# Patient Record
Sex: Male | Born: 1955 | Race: White | Hispanic: No | Marital: Married | Smoking: Former smoker
Health system: Southern US, Community
[De-identification: ages and names within clinical notes are randomized; demographics above are authoritative.]

## PROBLEM LIST (undated history)

## (undated) DIAGNOSIS — C801 Malignant (primary) neoplasm, unspecified: Secondary | ICD-10-CM

## (undated) DIAGNOSIS — I10 Essential (primary) hypertension: Secondary | ICD-10-CM

## (undated) DIAGNOSIS — E785 Hyperlipidemia, unspecified: Secondary | ICD-10-CM

## (undated) DIAGNOSIS — I2510 Atherosclerotic heart disease of native coronary artery without angina pectoris: Secondary | ICD-10-CM

## (undated) DIAGNOSIS — I4891 Unspecified atrial fibrillation: Secondary | ICD-10-CM

## (undated) HISTORY — PX: VASECTOMY: SHX75

## (undated) HISTORY — PX: APPENDECTOMY: SHX54

## (undated) HISTORY — DX: Hyperlipidemia, unspecified: E78.5

## (undated) HISTORY — DX: Essential (primary) hypertension: I10

## (undated) HISTORY — PX: LUMBAR DISC SURGERY: SHX700

## (undated) HISTORY — DX: Atherosclerotic heart disease of native coronary artery without angina pectoris: I25.10

## (undated) HISTORY — PX: WISDOM TOOTH EXTRACTION: SHX21

## (undated) HISTORY — PX: HERNIA REPAIR: SHX51

---

## 1998-07-17 ENCOUNTER — Ambulatory Visit (HOSPITAL_COMMUNITY): Admission: RE | Admit: 1998-07-17 | Discharge: 1998-07-17 | Payer: Self-pay | Admitting: *Deleted

## 1999-11-15 ENCOUNTER — Encounter: Payer: Self-pay | Admitting: Neurosurgery

## 1999-11-15 ENCOUNTER — Encounter: Admission: RE | Admit: 1999-11-15 | Discharge: 1999-11-15 | Payer: Self-pay | Admitting: Neurosurgery

## 2007-02-09 ENCOUNTER — Emergency Department (HOSPITAL_COMMUNITY): Admission: EM | Admit: 2007-02-09 | Discharge: 2007-02-09 | Payer: Self-pay | Admitting: Emergency Medicine

## 2008-05-05 ENCOUNTER — Encounter: Payer: Self-pay | Admitting: Cardiovascular Disease

## 2008-08-03 ENCOUNTER — Encounter: Payer: Self-pay | Admitting: Cardiovascular Disease

## 2008-08-08 ENCOUNTER — Encounter: Payer: Self-pay | Admitting: Cardiovascular Disease

## 2008-08-16 ENCOUNTER — Encounter: Payer: Self-pay | Admitting: Cardiovascular Disease

## 2008-08-19 ENCOUNTER — Ambulatory Visit: Payer: Self-pay | Admitting: Cardiology

## 2008-08-19 ENCOUNTER — Encounter: Payer: Self-pay | Admitting: Cardiovascular Disease

## 2008-09-12 DIAGNOSIS — M549 Dorsalgia, unspecified: Secondary | ICD-10-CM | POA: Insufficient documentation

## 2008-09-12 DIAGNOSIS — K469 Unspecified abdominal hernia without obstruction or gangrene: Secondary | ICD-10-CM | POA: Insufficient documentation

## 2008-09-15 ENCOUNTER — Ambulatory Visit: Payer: Self-pay | Admitting: Cardiovascular Disease

## 2008-09-15 DIAGNOSIS — R079 Chest pain, unspecified: Secondary | ICD-10-CM | POA: Insufficient documentation

## 2008-09-15 DIAGNOSIS — H8110 Benign paroxysmal vertigo, unspecified ear: Secondary | ICD-10-CM | POA: Insufficient documentation

## 2008-09-15 DIAGNOSIS — I10 Essential (primary) hypertension: Secondary | ICD-10-CM | POA: Insufficient documentation

## 2008-09-15 DIAGNOSIS — I491 Atrial premature depolarization: Secondary | ICD-10-CM | POA: Insufficient documentation

## 2008-09-30 ENCOUNTER — Telehealth (INDEPENDENT_AMBULATORY_CARE_PROVIDER_SITE_OTHER): Payer: Self-pay | Admitting: *Deleted

## 2008-10-14 ENCOUNTER — Encounter: Payer: Self-pay | Admitting: Cardiovascular Disease

## 2010-03-30 NOTE — Assessment & Plan Note (Signed)
Summary: NP3/ PALPS, HTN./ PT HAS [facility]/ GD   CC:  no compliants referral dr Dimas Aguas.  History of Present Illness: Ardith is seen today at the request of Dr. Dimas Aguas.  He was ill about a month ago.  He had an extensive workup.  Read through multiple records from [facility].  All started initially with what sounds like an episode of vertigo.  He had significant dizziness and presyncope.  He also had atypical chest pain.  Had a stress Myoview study read by Dr.Degent:   the study was done June 22 of 2010.  It was normal with a hypertensive response.  Since that time he has been placed on lisinopril HCTZ.  It appears that he had untreated high blood pressure for a while.  His blood pressure is much improved.  He had initial palpitations.  They're associated with his vertigo.  They're frequent skips but no prolonged rapid heart beats.  Are not associated with diaphoresis or chest pain.  He's hadan event  monitor on for 16 days.  Will have him take it off and process it.  He is having some PACs here in the office.  Early he feels 100% improved.  His vertigo is gone.  Further  neurological workup including CT scan MRI and carotid duplex scan were all negative.    Current Problems (verified): 1)  Hernia  (ICD-553.9) 2)  Back Pain, Chronic  (ICD-724.5)  Current Medications (verified): 1)  Flexeril 10 Mg Tabs (Cyclobenzaprine Hcl) .Marland Kitchen.. 1 Tab By Mouth Once Daily 2)  Lisinopril-Hydrochlorothiazide 10-12.5 Mg Tabs (Lisinopril-Hydrochlorothiazide) .Marland Kitchen.. 1 Tab By Mouth Once Daily 3)  Aspirin 81 Mg Tbec (Aspirin) .... Take One Tablet By Mouth Daily 4)  Multivitamins   Tabs (Multiple Vitamin) .Marland Kitchen.. 1 Tab By Mouth Once Daily 5)  Darvocet-N 100 100-650 Mg Tabs (Propoxyphene N-Apap) .... As Needed 6)  Vytorin 10-20 Mg Tabs (Ezetimibe-Simvastatin) .... Take One Tablet By Mouth Daily At Bedtime  Allergies (verified): No Known Drug Allergies  Past History:  Past Medical History: Last updated: 09/12/2008 HERNIA  (ICD-553.9) BACK PAIN, CHRONIC (ICD-724.5)    Past Surgical History: Last updated: 09/12/2008 back surgery hernia  Family History: non-contributory  Social History: Tobacco Use - No.  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  Review of Systems       Denies fever, malais, weight loss, blurry vision, decreased visual acuity, cough, sputum, SOB, hemoptysis, pleuritic pain, , heartburn, abdominal pain, melena, lower extremity edema, claudication, or rash. All other systems reviewed and negative except as indicated in HPI   Vital Signs:  Patient profile:   55 year old male Height:      73 inches Weight:      180 pounds BMI:     23.83 Pulse rate:   90 / minute Resp:     12 per minute BP sitting:   139 / 83  (left arm)  Vitals Entered By: Kem Parkinson (September 15, 2008 8:17 AM)  Physical Exam  General:  Affect appropriate Healthy:  appears stated age HEENT: normal Neck supple with no adenopathy JVP normal no bruits no thyromegaly Lungs clear with no wheezing and good diaphragmatic motion Heart:  S1/S2 no murmur,rub, gallop or click PMI normal Abdomen: benighn, BS positve, no tenderness, no AAA no bruit.  No HSM or HJR Distal pulses intact with no bruits No  edema Neuro non-focal Skin warm and dry    Impression & Recommendations:  Problem # 1:  CHEST PAIN UNSPECIFIED (ICD-786.50) Normal myovue atypical continue to observe His updated medication list for this problem includes:    Lisinopril-hydrochlorothiazide 10-12.5 Mg Tabs (Lisinopril-hydrochlorothiazide) .Marland Kitchen... 1 tab by mouth once daily    Aspirin 81 Mg Tbec (Aspirin) .Marland Kitchen... Take one tablet by mouth daily  Problem # 2:  BENIGN POSITIONAL VERTIGO (ICD-386.11) Resolved with normal head CT, MRI and carotid duplex all reviewed today  Problem # 3:  PAC (ICD-427.61) R/O atrial  arrythmias.  Will review event monitor when turned in and processed His updated medication list for this problem includes:    Lisinopril-hydrochlorothiazide 10-12.5 Mg Tabs (Lisinopril-hydrochlorothiazide) .Marland Kitchen... 1 tab by mouth once daily    Aspirin 81 Mg Tbec (Aspirin) .Marland Kitchen... Take one tablet by mouth daily  Problem # 4:  ESSENTIAL HYPERTENSION, BENIGN (ICD-401.1) Well controlled on ACE and diuretic His updated medication list for this problem includes:    Lisinopril-hydrochlorothiazide 10-12.5 Mg Tabs (Lisinopril-hydrochlorothiazide) .Marland Kitchen... 1 tab by mouth once daily    Aspirin 81 Mg Tbec (Aspirin) .Marland Kitchen... Take one tablet by mouth daily   EKG Report  Procedure date:  09/15/2008  Findings:      NSR 92 ICRBBB Borderline ECG

## 2010-03-30 NOTE — Progress Notes (Signed)
  Phone Note Outgoing Call   Call placed by: Deliah Goody, RN,  September 30, 2008 11:54 AM Summary of Call: pt aware of final moniter results, per dr Eden Emms sinus rhythm with occ PAC/PVC, no sig arrythmia Deliah Goody, RN  September 30, 2008 11:57 AM

## 2010-03-30 NOTE — Letter (Signed)
Summary: [facility]  [facility]   Imported By: Roderic Ovens 10/14/2008 13:17:33  _____________________________________________________________________  External Attachment:    Type:   Image     Comment:   External Document

## 2014-12-18 ENCOUNTER — Encounter (INDEPENDENT_AMBULATORY_CARE_PROVIDER_SITE_OTHER): Payer: Self-pay | Admitting: Ophthalmology

## 2014-12-18 ENCOUNTER — Encounter (INDEPENDENT_AMBULATORY_CARE_PROVIDER_SITE_OTHER): Payer: 59 | Admitting: Ophthalmology

## 2014-12-18 DIAGNOSIS — H43813 Vitreous degeneration, bilateral: Secondary | ICD-10-CM

## 2014-12-18 DIAGNOSIS — H2513 Age-related nuclear cataract, bilateral: Secondary | ICD-10-CM | POA: Diagnosis not present

## 2014-12-18 DIAGNOSIS — H35033 Hypertensive retinopathy, bilateral: Secondary | ICD-10-CM | POA: Diagnosis not present

## 2014-12-18 DIAGNOSIS — I10 Essential (primary) hypertension: Secondary | ICD-10-CM | POA: Diagnosis not present

## 2016-04-11 DIAGNOSIS — R300 Dysuria: Secondary | ICD-10-CM | POA: Diagnosis not present

## 2016-04-11 DIAGNOSIS — N342 Other urethritis: Secondary | ICD-10-CM | POA: Diagnosis not present

## 2016-04-27 DIAGNOSIS — I10 Essential (primary) hypertension: Secondary | ICD-10-CM | POA: Diagnosis not present

## 2016-04-27 DIAGNOSIS — M549 Dorsalgia, unspecified: Secondary | ICD-10-CM | POA: Diagnosis not present

## 2016-04-27 DIAGNOSIS — Z0000 Encounter for general adult medical examination without abnormal findings: Secondary | ICD-10-CM | POA: Diagnosis not present

## 2016-04-27 DIAGNOSIS — E785 Hyperlipidemia, unspecified: Secondary | ICD-10-CM | POA: Diagnosis not present

## 2016-05-23 DIAGNOSIS — L821 Other seborrheic keratosis: Secondary | ICD-10-CM | POA: Diagnosis not present

## 2016-05-23 DIAGNOSIS — L814 Other melanin hyperpigmentation: Secondary | ICD-10-CM | POA: Diagnosis not present

## 2016-05-23 DIAGNOSIS — D1801 Hemangioma of skin and subcutaneous tissue: Secondary | ICD-10-CM | POA: Diagnosis not present

## 2016-06-15 DIAGNOSIS — E785 Hyperlipidemia, unspecified: Secondary | ICD-10-CM | POA: Diagnosis not present

## 2016-06-15 DIAGNOSIS — G4700 Insomnia, unspecified: Secondary | ICD-10-CM | POA: Diagnosis not present

## 2016-08-17 DIAGNOSIS — M5137 Other intervertebral disc degeneration, lumbosacral region: Secondary | ICD-10-CM | POA: Diagnosis not present

## 2016-08-17 DIAGNOSIS — E785 Hyperlipidemia, unspecified: Secondary | ICD-10-CM | POA: Diagnosis not present

## 2016-08-17 DIAGNOSIS — M549 Dorsalgia, unspecified: Secondary | ICD-10-CM | POA: Diagnosis not present

## 2016-10-13 DIAGNOSIS — H43393 Other vitreous opacities, bilateral: Secondary | ICD-10-CM | POA: Diagnosis not present

## 2016-10-21 DIAGNOSIS — I10 Essential (primary) hypertension: Secondary | ICD-10-CM | POA: Diagnosis not present

## 2016-10-21 DIAGNOSIS — B356 Tinea cruris: Secondary | ICD-10-CM | POA: Diagnosis not present

## 2016-10-21 DIAGNOSIS — E785 Hyperlipidemia, unspecified: Secondary | ICD-10-CM | POA: Diagnosis not present

## 2016-10-24 DIAGNOSIS — I10 Essential (primary) hypertension: Secondary | ICD-10-CM | POA: Diagnosis not present

## 2016-10-24 DIAGNOSIS — E784 Other hyperlipidemia: Secondary | ICD-10-CM | POA: Diagnosis not present

## 2017-01-02 DIAGNOSIS — H00012 Hordeolum externum right lower eyelid: Secondary | ICD-10-CM | POA: Diagnosis not present

## 2017-01-25 DIAGNOSIS — M5137 Other intervertebral disc degeneration, lumbosacral region: Secondary | ICD-10-CM | POA: Diagnosis not present

## 2017-01-25 DIAGNOSIS — M549 Dorsalgia, unspecified: Secondary | ICD-10-CM | POA: Diagnosis not present

## 2017-01-25 DIAGNOSIS — Z23 Encounter for immunization: Secondary | ICD-10-CM | POA: Diagnosis not present

## 2017-01-25 DIAGNOSIS — G894 Chronic pain syndrome: Secondary | ICD-10-CM | POA: Diagnosis not present

## 2017-01-25 DIAGNOSIS — E785 Hyperlipidemia, unspecified: Secondary | ICD-10-CM | POA: Diagnosis not present

## 2017-04-27 DIAGNOSIS — H43813 Vitreous degeneration, bilateral: Secondary | ICD-10-CM | POA: Diagnosis not present

## 2017-04-27 DIAGNOSIS — H33311 Horseshoe tear of retina without detachment, right eye: Secondary | ICD-10-CM | POA: Diagnosis not present

## 2017-04-28 DIAGNOSIS — Z125 Encounter for screening for malignant neoplasm of prostate: Secondary | ICD-10-CM | POA: Diagnosis not present

## 2017-04-28 DIAGNOSIS — E785 Hyperlipidemia, unspecified: Secondary | ICD-10-CM | POA: Diagnosis not present

## 2017-04-28 DIAGNOSIS — I10 Essential (primary) hypertension: Secondary | ICD-10-CM | POA: Diagnosis not present

## 2017-04-28 DIAGNOSIS — Z0000 Encounter for general adult medical examination without abnormal findings: Secondary | ICD-10-CM | POA: Diagnosis not present

## 2017-04-28 DIAGNOSIS — H33311 Horseshoe tear of retina without detachment, right eye: Secondary | ICD-10-CM | POA: Diagnosis not present

## 2017-04-28 DIAGNOSIS — M549 Dorsalgia, unspecified: Secondary | ICD-10-CM | POA: Diagnosis not present

## 2017-05-12 DIAGNOSIS — H33311 Horseshoe tear of retina without detachment, right eye: Secondary | ICD-10-CM | POA: Diagnosis not present

## 2017-05-23 DIAGNOSIS — L820 Inflamed seborrheic keratosis: Secondary | ICD-10-CM | POA: Diagnosis not present

## 2017-05-23 DIAGNOSIS — D2261 Melanocytic nevi of right upper limb, including shoulder: Secondary | ICD-10-CM | POA: Diagnosis not present

## 2017-05-23 DIAGNOSIS — D1801 Hemangioma of skin and subcutaneous tissue: Secondary | ICD-10-CM | POA: Diagnosis not present

## 2017-05-23 DIAGNOSIS — L821 Other seborrheic keratosis: Secondary | ICD-10-CM | POA: Diagnosis not present

## 2017-05-23 DIAGNOSIS — D485 Neoplasm of uncertain behavior of skin: Secondary | ICD-10-CM | POA: Diagnosis not present

## 2017-05-23 DIAGNOSIS — L814 Other melanin hyperpigmentation: Secondary | ICD-10-CM | POA: Diagnosis not present

## 2017-05-30 DIAGNOSIS — H33311 Horseshoe tear of retina without detachment, right eye: Secondary | ICD-10-CM | POA: Diagnosis not present

## 2017-06-07 DIAGNOSIS — Z1211 Encounter for screening for malignant neoplasm of colon: Secondary | ICD-10-CM | POA: Diagnosis not present

## 2017-06-27 DIAGNOSIS — H33311 Horseshoe tear of retina without detachment, right eye: Secondary | ICD-10-CM | POA: Diagnosis not present

## 2017-07-28 DIAGNOSIS — K635 Polyp of colon: Secondary | ICD-10-CM | POA: Diagnosis not present

## 2017-07-28 DIAGNOSIS — D126 Benign neoplasm of colon, unspecified: Secondary | ICD-10-CM | POA: Diagnosis not present

## 2017-07-28 DIAGNOSIS — Z1211 Encounter for screening for malignant neoplasm of colon: Secondary | ICD-10-CM | POA: Diagnosis not present

## 2017-09-26 DIAGNOSIS — H33311 Horseshoe tear of retina without detachment, right eye: Secondary | ICD-10-CM | POA: Diagnosis not present

## 2017-09-26 DIAGNOSIS — H43813 Vitreous degeneration, bilateral: Secondary | ICD-10-CM | POA: Diagnosis not present

## 2017-10-31 DIAGNOSIS — G894 Chronic pain syndrome: Secondary | ICD-10-CM | POA: Diagnosis not present

## 2017-10-31 DIAGNOSIS — I10 Essential (primary) hypertension: Secondary | ICD-10-CM | POA: Diagnosis not present

## 2017-10-31 DIAGNOSIS — E785 Hyperlipidemia, unspecified: Secondary | ICD-10-CM | POA: Diagnosis not present

## 2018-01-05 DIAGNOSIS — L03114 Cellulitis of left upper limb: Secondary | ICD-10-CM | POA: Diagnosis not present

## 2018-01-05 DIAGNOSIS — Z23 Encounter for immunization: Secondary | ICD-10-CM | POA: Diagnosis not present

## 2018-02-05 DIAGNOSIS — H43393 Other vitreous opacities, bilateral: Secondary | ICD-10-CM | POA: Diagnosis not present

## 2018-04-23 DIAGNOSIS — M545 Low back pain: Secondary | ICD-10-CM | POA: Diagnosis not present

## 2018-04-23 DIAGNOSIS — S39012A Strain of muscle, fascia and tendon of lower back, initial encounter: Secondary | ICD-10-CM | POA: Diagnosis not present

## 2018-05-25 DIAGNOSIS — I10 Essential (primary) hypertension: Secondary | ICD-10-CM | POA: Diagnosis not present

## 2018-05-25 DIAGNOSIS — E785 Hyperlipidemia, unspecified: Secondary | ICD-10-CM | POA: Diagnosis not present

## 2018-05-25 DIAGNOSIS — Z125 Encounter for screening for malignant neoplasm of prostate: Secondary | ICD-10-CM | POA: Diagnosis not present

## 2018-05-29 DIAGNOSIS — E785 Hyperlipidemia, unspecified: Secondary | ICD-10-CM | POA: Diagnosis not present

## 2018-05-29 DIAGNOSIS — I10 Essential (primary) hypertension: Secondary | ICD-10-CM | POA: Diagnosis not present

## 2018-05-29 DIAGNOSIS — Z0000 Encounter for general adult medical examination without abnormal findings: Secondary | ICD-10-CM | POA: Diagnosis not present

## 2018-05-29 DIAGNOSIS — M549 Dorsalgia, unspecified: Secondary | ICD-10-CM | POA: Diagnosis not present

## 2018-07-20 DIAGNOSIS — Z20828 Contact with and (suspected) exposure to other viral communicable diseases: Secondary | ICD-10-CM | POA: Diagnosis not present

## 2019-08-25 NOTE — Progress Notes (Signed)
Cardiology Office Note:   Date:  08/27/2019  NAME:  Lonnie Palmer    MRN: [id number] DOB:  1955-06-27   PCP:  No primary care provider on file.  Cardiologist:  No primary care provider on file.   Referring MD: Donald Prose, MD   Chief Complaint  Patient presents with  . Palpitations   History of Present Illness:   Lonnie Palmer is a 64 y.o. male with a hx of hyperlipidemia, hypertension who is being seen today for the evaluation of tachycardia/palpitations at the request of Donald Prose, MD.  He reports he has had episodes of what he describes as skipped heartbeats for years.  He reports 10 to 15 years of these.  Apparently he can feel his pulse at times and notices that his pulse is irregular.  He was recently evaluated his primary care physician after a stressful event at work.  Apparently he had high blood pressure and significant stress at work.  He noticed about 36 hours of blood pressure spikes as well as elevated heart rates.  He reports things have calm down has had no further recurrence of persistently elevated heart rates.  His EKG today demonstrates normal sinus rhythm with a heart rate 86 and an occasional PAC.  He reports that his extra heartbeats do not bother him.  He can play golf and walk as far as he likes without any limitations such as chest pain or shortness of breath.  His most recent thyroid studies below are normal.  He has no lower extremity edema or any symptoms suggestive of congestive heart failure.  He reports that he is here to make sure things okay.  He does have high cholesterol but is well controlled on medication.  His blood pressure today is 118/80.  He has never had a heart attack or stroke.  He seems to be without symptoms.  Main issue that I can tell are PACs versus PVCs lead quantify these.  He is a former smoker but quit a number of years ago.  He smoked for about 7 years.  He does not consume alcohol in excess.  No reported.  He does work as an Civil Service fast streamer for [employer].  Problem List 1. HTN 2. HLD -Total cholesterol 126, HDL 51, LDL 55, triglycerides 115, hemoglobin 17.2, TSH 0.72  Past Medical History: Past Medical History:  Diagnosis Date  . Hyperlipidemia   . Hypertension     Past Surgical History: Past Surgical History:  Procedure Laterality Date  . LUMBAR DISC SURGERY      Current Medications: Current Meds  Medication Sig  . aspirin EC 81 MG tablet Take 81 mg by mouth daily. Swallow whole.  . cyclobenzaprine (FLEXERIL) 10 MG tablet Take 10 mg by mouth at bedtime as needed.  . ezetimibe (ZETIA) 10 MG tablet Take 10 mg by mouth daily.  Marland Kitchen HYDROcodone-acetaminophen (NORCO/VICODIN) 5-325 MG tablet Take 1 tablet by mouth every 6 (six) hours as needed for moderate pain.  Marland Kitchen lisinopril-hydrochlorothiazide (ZESTORETIC) 10-12.5 MG tablet Take 1 tablet by mouth daily.  . Multiple Vitamins-Minerals (VITRUM 50+ SENIOR MULTI PO) Take by mouth.  . simvastatin (ZOCOR) 20 MG tablet Take 20 mg by mouth daily.     Allergies:    Patient has no allergy information on record.   Social History: Social History   Socioeconomic History  . Marital status: Married    Spouse name: Not on file  . Number of children: 1  . Years of education:  Not on file  . Highest education level: Not on file  Occupational History  . Occupation: Contractor  Tobacco Use  . Smoking status: Former Smoker    Packs/day: 0.50    Years: 6.00    Pack years: 3.00  . Smokeless tobacco: Never Used  Substance and Sexual Activity  . Alcohol use: Never  . Drug use: Never  . Sexual activity: Not on file  Other Topics Concern  . Not on file  Social History Narrative  . Not on file   Social Determinants of Health   Financial Resource Strain:   . Difficulty of Paying Living Expenses:   Food Insecurity:   . Worried About Charity fundraiser in the Last Year:   . Arboriculturist in the Last Year:   Transportation Needs:   . Lexicographer (Medical):   Marland Kitchen Lack of Transportation (Non-Medical):   Physical Activity:   . Days of Exercise per Week:   . Minutes of Exercise per Session:   Stress:   . Feeling of Stress :   Social Connections:   . Frequency of Communication with Friends and Family:   . Frequency of Social Gatherings with Friends and Family:   . Attends Religious Services:   . Active Member of Clubs or Organizations:   . Attends Archivist Meetings:   Marland Kitchen Marital Status:      Family History: The patient's family history includes Heart attack in his maternal grandfather.  ROS:   All other ROS reviewed and negative. Pertinent positives noted in the HPI.     EKGs/Labs/Other Studies Reviewed:   The following studies were personally reviewed by me today:  EKG:  EKG is  ordered today.  The ekg ordered today demonstrates normal sinus rhythm, heart rate 86, PACs noted, and was personally reviewed by me.   Recent Labs: No results found for requested labs within last 8760 hours.   Recent Lipid Panel No results found for: CHOL, TRIG, HDL, CHOLHDL, VLDL, LDLCALC, LDLDIRECT  Physical Exam:   VS:  BP 118/80   Pulse 88   Ht 6\' 1"  (1.854 m)   Wt 182 lb 12.8 oz (82.9 kg)   SpO2 99%   BMI 24.12 kg/m    Wt Readings from Last 3 Encounters:  08/27/19 182 lb 12.8 oz (82.9 kg)    General: Well nourished, well developed, in no acute distress Heart: Atraumatic, normal size  Eyes: PEERLA, EOMI  Neck: Supple, no JVD Endocrine: No thryomegaly Cardiac: Normal S1, S2; RRR; no murmurs, rubs, or gallops Lungs: Clear to auscultation bilaterally, no wheezing, rhonchi or rales  Abd: Soft, nontender, no hepatomegaly  Ext: No edema, pulses 2+ Musculoskeletal: No deformities, BUE and BLE strength normal and equal Skin: Warm and dry, no rashes   Neuro: Alert and oriented to person, place, time, and situation, CNII-XII grossly intact, no focal deficits  Psych: Normal mood and affect   ASSESSMENT:     Lonnie Palmer is a 64 y.o. male who presents for the following: 1. PAC (premature atrial contraction)   2. Palpitations   3. Tachycardia   4. Essential hypertension   5. Mixed hyperlipidemia     PLAN:   1. PAC (premature atrial contraction) 2. Palpitations 3. Tachycardia -He has had symptoms of skipped heartbeats for years.  No monitor in our system.  Thyroid studies were normal recently.  This did coincide with stress at work.  This also coincided with elevated  blood pressure.  This could have been the trigger for his PACs.  He possibly could be having PVCs.  We will proceed with an echocardiogram to make sure his heart structurally normal.  We will also proceed with a 3-day Zio patch to quantify his PAC or PVC burden.  His EKG today demonstrates normal sinus rhythm with left axis deviation and a single PAC.  This is not need to be treated unless he is having a tremendous amount of them.  This would also need to be treated he has detriment to his heart function.  He describes no symptoms that are bothersome at this time.  We will hold on any treatment for now.  I will plan to see him back in 3 months after he completes a monitor and echocardiogram.  4. Essential hypertension -Continue current blood pressure medications.  BP 118/80 today.  5. Mixed hyperlipidemia -Most recent LDL cholesterol well below 70.  Continue statin.  Disposition: Return in about 3 months (around 11/27/2019).  Medication Adjustments/Labs and Tests Ordered: Current medicines are reviewed at length with the patient today.  Concerns regarding medicines are outlined above.  Orders Placed This Encounter  Procedures  . LONG TERM MONITOR (3-14 DAYS)  . EKG 12-Lead  . ECHOCARDIOGRAM COMPLETE   No orders of the defined types were placed in this encounter.   Patient Instructions  Medication Instructions:  The current medical regimen is effective;  continue present plan and medications.  *If you need a refill  on your cardiac medications before your next appointment, please call your pharmacy*   Testing/Procedures: Echocardiogram - Your physician has requested that you have an echocardiogram. Echocardiography is a painless test that uses sound waves to create images of your heart. It provides your doctor with information about the size and shape of your heart and how well your heart's chambers and valves are working. This procedure takes approximately one hour. There are no restrictions for this procedure. This will be performed at our [facility] location - [address].  Your physician has recommended that you wear a 3 DAY ZIO-PATCH monitor. The Zio patch cardiac monitor continuously records heart rhythm data for up to 14 days, this is for patients being evaluated for multiple types heart rhythms. For the first 24 hours post application, please avoid getting the Zio monitor wet in the shower or by excessive sweating during exercise. After that, feel free to carry on with regular activities. Keep soaps and lotions away from the ZIO XT Patch.  This will be mailed to you, please expect 7-10 days to receive.         Follow-Up: At [facility], you and your health needs are our priority.  As part of our continuing mission to provide you with exceptional heart care, we have created designated Provider Care Teams.  These Care Teams include your primary Cardiologist (physician) and Advanced Practice Providers (APPs -  Physician Assistants and Nurse Practitioners) who all work together to provide you with the care you need, when you need it.  We recommend signing up for the patient portal called "MyChart".  Sign up information is provided on this After Visit Summary.  MyChart is used to connect with patients for Virtual Visits (Telemedicine).  Patients are able to view lab/test results, encounter notes, upcoming appointments, etc.  Non-urgent messages can be sent to your provider as well.   To  learn more about what you can do with MyChart, go to [email or web address].  Your next appointment:   3 month(s)  The format for your next appointment:   In Person  Provider:   Eleonore Chiquito, MD        Signed, Addison Naegeli. Audie Box, [place]  [address], [place], [place] [address] [phone]  08/27/2019 10:56 AM

## 2019-08-27 ENCOUNTER — Encounter: Payer: Self-pay | Admitting: Cardiovascular Disease

## 2019-08-27 ENCOUNTER — Other Ambulatory Visit: Payer: Self-pay

## 2019-08-27 ENCOUNTER — Ambulatory Visit: Payer: 59 | Admitting: Cardiovascular Disease

## 2019-08-27 VITALS — BP 118/80 | HR 88 | Ht 73.0 in | Wt 182.8 lb

## 2019-08-27 DIAGNOSIS — I491 Atrial premature depolarization: Secondary | ICD-10-CM | POA: Diagnosis not present

## 2019-08-27 DIAGNOSIS — R000 Tachycardia, unspecified: Secondary | ICD-10-CM | POA: Diagnosis not present

## 2019-08-27 DIAGNOSIS — I10 Essential (primary) hypertension: Secondary | ICD-10-CM | POA: Diagnosis not present

## 2019-08-27 DIAGNOSIS — E782 Mixed hyperlipidemia: Secondary | ICD-10-CM

## 2019-08-27 DIAGNOSIS — R002 Palpitations: Secondary | ICD-10-CM | POA: Diagnosis not present

## 2019-08-27 NOTE — Patient Instructions (Signed)
Medication Instructions:  The current medical regimen is effective;  continue present plan and medications.  *If you need a refill on your cardiac medications before your next appointment, please call your pharmacy*   Testing/Procedures: Echocardiogram - Your physician has requested that you have an echocardiogram. Echocardiography is a painless test that uses sound waves to create images of your heart. It provides your doctor with information about the size and shape of your heart and how well your heart's chambers and valves are working. This procedure takes approximately one hour. There are no restrictions for this procedure. This will be performed at our [facility] location - [address], [address].  Your physician has recommended that you wear a 3 DAY ZIO-PATCH monitor. The Zio patch cardiac monitor continuously records heart rhythm data for up to 14 days, this is for patients being evaluated for multiple types heart rhythms. For the first 24 hours post application, please avoid getting the Zio monitor wet in the shower or by excessive sweating during exercise. After that, feel free to carry on with regular activities. Keep soaps and lotions away from the ZIO XT Patch.  This will be mailed to you, please expect 7-10 days to receive.         Follow-Up: At [facility], you and your health needs are our priority.  As part of our continuing mission to provide you with exceptional heart care, we have created designated Provider Care Teams.  These Care Teams include your primary Cardiologist (physician) and Advanced Practice Providers (APPs -  Physician Assistants and Nurse Practitioners) who all work together to provide you with the care you need, when you need it.  We recommend signing up for the patient portal called "MyChart".  Sign up information is provided on this After Visit Summary.  MyChart is used to connect with patients for Virtual Visits (Telemedicine).  Patients are able to  view lab/test results, encounter notes, upcoming appointments, etc.  Non-urgent messages can be sent to your provider as well.   To learn more about what you can do with MyChart, go to [email or web address].    Your next appointment:   3 month(s)  The format for your next appointment:   In Person  Provider:   Eleonore Chiquito, MD

## 2019-08-28 ENCOUNTER — Encounter: Payer: Self-pay | Admitting: *Deleted

## 2019-08-28 NOTE — Progress Notes (Signed)
Patient ID: Lonnie Palmer, male   DOB: 02-Nov-1955, 64 y.o.   MRN: [id number] Patient enrolled for [employer] to ship a 3 day ZIO XT long term holter monitor to his home.

## 2019-08-31 ENCOUNTER — Ambulatory Visit (INDEPENDENT_AMBULATORY_CARE_PROVIDER_SITE_OTHER): Payer: 59

## 2019-08-31 DIAGNOSIS — R002 Palpitations: Secondary | ICD-10-CM | POA: Diagnosis not present

## 2019-09-11 ENCOUNTER — Telehealth: Payer: Self-pay | Admitting: *Deleted

## 2019-09-11 NOTE — Telephone Encounter (Signed)
Returned call to patient. He was concerned because he received a letter from his insurance stating his echo had been approved but not his monitor. I explained the monitor does not require any pre authorization.

## 2019-09-11 NOTE — Telephone Encounter (Signed)
[employer] is calling requesting to speak with Lonnie Palmer in regards to prior authorization for his monitor. Please advise.

## 2019-09-12 ENCOUNTER — Ambulatory Visit (HOSPITAL_COMMUNITY): Payer: 59 | Attending: Internal Medicine

## 2019-09-12 ENCOUNTER — Other Ambulatory Visit: Payer: Self-pay

## 2019-09-12 DIAGNOSIS — R002 Palpitations: Secondary | ICD-10-CM | POA: Diagnosis not present

## 2019-09-23 ENCOUNTER — Telehealth: Payer: Self-pay

## 2019-09-23 NOTE — Telephone Encounter (Signed)
Got it!     Thanks =).

## 2019-09-23 NOTE — Telephone Encounter (Signed)
Received a call from [place] with [employer] calling to report patient's monitor has been posted.Message sent to monitor tech to up load report to Dr.O'Neal. 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

## 2019-09-27 ENCOUNTER — Other Ambulatory Visit: Payer: Self-pay

## 2019-09-27 MED ORDER — METOPROLOL SUCCINATE ER 25 MG PO TB24
25.0000 mg | ORAL_TABLET | Freq: Every day | ORAL | 1 refills | Status: DC
Start: 2019-09-27 — End: 2019-11-15

## 2019-11-14 NOTE — Progress Notes (Signed)
Cardiology Office Note:   Date:  11/15/2019  NAME:  Lonnie Palmer    MRN: [id number] DOB:  1955-12-30   PCP:  Donald Prose, MD  Cardiologist:  Evalina Field, MD   Referring MD: No ref. provider found   Chief Complaint  Patient presents with  . Follow-up   History of Present Illness:   Lonnie Palmer is a 64 y.o. male with a hx of HTN, HLD, PACs who presents for follow-up. Was seen for PACs. Found to have high PAC burden and normal echo. Started on beta blocker. He reports has been doing well.  EKG confirms he is in atrial flutter.  He reports he has noticed some intermittent palpitations but no major symptoms.  His initial symptoms were some dizziness and he was found to have frequent PACs.  He is clearly in atrial flutter today.  I did discuss with Dr. Lars Mage about consideration for a flutter ablation.  He will see him today as an urgent appointment. Blood pressures well controlled today 132/72.  He remains on metoprolol tartrate 25 mg daily.  Overall appears to be doing well.  He denies any chest pain, chest pain or shortness of breath in office today.  Problem List 1. HTN 2. HLD -Total cholesterol 126, HDL 51, LDL 55, triglycerides 115, hemoglobin 17.2, TSH 0.72 3. PACs -23.3% PAC burden 4.  Atrial flutter -11/15/2019. -CHADSVASC=1  Past Medical History: Past Medical History:  Diagnosis Date  . Hyperlipidemia   . Hypertension     Past Surgical History: Past Surgical History:  Procedure Laterality Date  . LUMBAR DISC SURGERY      Current Medications: Current Meds  Medication Sig  . aspirin EC 81 MG tablet Take 81 mg by mouth daily. Swallow whole.  . cyclobenzaprine (FLEXERIL) 10 MG tablet Take 10 mg by mouth at bedtime as needed.  . ezetimibe (ZETIA) 10 MG tablet Take 10 mg by mouth daily.  Marland Kitchen HYDROcodone-acetaminophen (NORCO/VICODIN) 5-325 MG tablet Take 1 tablet by mouth every 6 (six) hours as needed for moderate pain.  Marland Kitchen  lisinopril-hydrochlorothiazide (ZESTORETIC) 10-12.5 MG tablet Take 1 tablet by mouth daily.  . metoprolol succinate (TOPROL XL) 50 MG 24 hr tablet Take 1 tablet (50 mg total) by mouth daily.  . Multiple Vitamins-Minerals (VITRUM 50+ SENIOR MULTI PO) Take by mouth.  . simvastatin (ZOCOR) 20 MG tablet Take 20 mg by mouth daily.  . [DISCONTINUED] metoprolol succinate (TOPROL XL) 25 MG 24 hr tablet Take 1 tablet (25 mg total) by mouth daily.     Allergies:    Patient has no known allergies.   Social History: Social History   Socioeconomic History  . Marital status: Married    Spouse name: Not on file  . Number of children: 1  . Years of education: Not on file  . Highest education level: Not on file  Occupational History  . Occupation: Contractor  Tobacco Use  . Smoking status: Former Smoker    Packs/day: 0.50    Years: 6.00    Pack years: 3.00  . Smokeless tobacco: Never Used  Substance and Sexual Activity  . Alcohol use: Never  . Drug use: Never  . Sexual activity: Not on file  Other Topics Concern  . Not on file  Social History Narrative  . Not on file   Social Determinants of Health   Financial Resource Strain:   . Difficulty of Paying Living Expenses: Not on file  Food Insecurity:   .  Worried About Charity fundraiser in the Last Year: Not on file  . Ran Out of Food in the Last Year: Not on file  Transportation Needs:   . Lack of Transportation (Medical): Not on file  . Lack of Transportation (Non-Medical): Not on file  Physical Activity:   . Days of Exercise per Week: Not on file  . Minutes of Exercise per Session: Not on file  Stress:   . Feeling of Stress : Not on file  Social Connections:   . Frequency of Communication with Friends and Family: Not on file  . Frequency of Social Gatherings with Friends and Family: Not on file  . Attends Religious Services: Not on file  . Active Member of Clubs or Organizations: Not on file  . Attends Theatre manager Meetings: Not on file  . Marital Status: Not on file     Family History: The patient's family history includes Heart attack in his maternal grandfather.  ROS:   All other ROS reviewed and negative. Pertinent positives noted in the HPI.     EKGs/Labs/Other Studies Reviewed:   The following studies were personally reviewed by me today:  [place] 08/31/2019 Impression: 1. Brief ectopic atrial tachycardia episodes (longest 7 beats; 2.8 seconds). 2. Very frequent PACs (PAC burden 23.3%).  3. 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 No concerning AV block was present.  4. Frequent non-conducted PACs.   TTE 09/12/2019  1. Left ventricular ejection fraction, by estimation, is 60 to 65%. The  left ventricle has normal function. The left ventricle has no regional  wall motion abnormalities. There is mild left ventricular hypertrophy.  Left ventricular diastolic parameters  are consistent with Grade I diastolic dysfunction (impaired relaxation).  2. Right ventricular systolic function is normal. The right ventricular  size is normal.  3. The mitral valve is grossly normal. Trivial mitral valve  regurgitation.  4. The aortic valve is tricuspid. Aortic valve regurgitation is not  visualized.  5. The inferior vena cava is normal in size with greater than 50%  respiratory variability, suggesting right atrial pressure of 3 mmHg.   Recent Labs: No results found for requested labs within last 8760 hours.   Recent Lipid Panel No results found for: CHOL, TRIG, HDL, CHOLHDL, VLDL, LDLCALC, LDLDIRECT  Physical Exam:   VS:  BP 132/72   Pulse 94   Ht 6\' 1"  (1.854 m)   Wt 181 lb 6.4 oz (82.3 kg)   SpO2 98%   BMI 23.93 kg/m    Wt Readings from Last 3 Encounters:  11/15/19 181 lb 6.4 oz (82.3 kg)  08/27/19 182 lb 12.8 oz (82.9 kg)    General: Well nourished, well developed, in no acute distress Heart: Atraumatic, normal size  Eyes: PEERLA, EOMI  Neck: Supple, no  JVD Endocrine: No thryomegaly Cardiac: Normal S1, S2; irregular rhythm, no murmurs rubs or gallops Lungs: Clear to auscultation bilaterally, no wheezing, rhonchi or rales  Abd: Soft, nontender, no hepatomegaly  Ext: No edema, pulses 2+ Musculoskeletal: No deformities, BUE and BLE strength normal and equal Skin: Warm and dry, no rashes   Neuro: Alert and oriented to person, place, time, and situation, CNII-XII grossly intact, no focal deficits  Psych: Normal mood and affect   ASSESSMENT:   Lonnie Palmer is a 64 y.o. male who presents for the following: 1. Typical atrial flutter (HCC)   2. Palpitations   3. Essential hypertension   4. Mixed hyperlipidemia  PLAN:   1. Typical atrial flutter (HCC) 2. Palpitations -EKG shows typical atrial flutter. CHADSVASC=1.  He was given samples of Eliquis.  I did discuss his case with Dr. Lars Mage in electrophysiology.  I think he would be a good candidate for an ablation.  He will work him in for an urgent appointment today.  I did increase his metoprolol to 50 mg daily.  We did give him samples of Eliquis and start a prescription of 5 mg twice daily.  This may not be long-term for him. -We will see him back in 3 months.  3. Essential hypertension -Well-controlled today.  4. Mixed hyperlipidemia -Well-controlled on Lipitor.  Disposition: Return in about 3 months (around 02/14/2020).  Medication Adjustments/Labs and Tests Ordered: Current medicines are reviewed at length with the patient today.  Concerns regarding medicines are outlined above.  Orders Placed This Encounter  Procedures  . EKG 12-Lead   Meds ordered this encounter  Medications  . apixaban (ELIQUIS) 5 MG TABS tablet    Sig: Take 1 tablet (5 mg total) by mouth 2 (two) times daily.    Dispense:  60 tablet    Refill:  3  . metoprolol succinate (TOPROL XL) 50 MG 24 hr tablet    Sig: Take 1 tablet (50 mg total) by mouth daily.    Dispense:  90 tablet    Refill:   1    Patient Instructions  Medication Instructions:  Increase Metoprolol to 50 mg daily  Start Eliquis 5 mg twice daily   *If you need a refill on your cardiac medications before your next appointment, please call your pharmacy*   Follow-Up: At [facility], you and your health needs are our priority.  As part of our continuing mission to provide you with exceptional heart care, we have created designated Provider Care Teams.  These Care Teams include your primary Cardiologist (physician) and Advanced Practice Providers (APPs -  Physician Assistants and Nurse Practitioners) who all work together to provide you with the care you need, when you need it.  We recommend signing up for the patient portal called "MyChart".  Sign up information is provided on this After Visit Summary.  MyChart is used to connect with patients for Virtual Visits (Telemedicine).  Patients are able to view lab/test results, encounter notes, upcoming appointments, etc.  Non-urgent messages can be sent to your provider as well.   To learn more about what you can do with MyChart, go to [email or web address].    Your next appointment:   3 month(s)  The format for your next appointment:   In Person  Provider:   Eleonore Chiquito, MD        Time Spent with Patient: I have spent a total of 25 minutes with patient reviewing hospital notes, telemetry, EKGs, labs and examining the patient as well as establishing an assessment and plan that was discussed with the patient.  > 50% of time was spent in direct patient care.  Signed, Addison Naegeli. Audie Box, [place]  [address], [place], [place] [address] [phone]  11/15/2019 10:16 AM

## 2019-11-15 ENCOUNTER — Encounter: Payer: Self-pay | Admitting: Cardiovascular Disease

## 2019-11-15 ENCOUNTER — Ambulatory Visit: Payer: 59 | Admitting: Cardiovascular Disease

## 2019-11-15 ENCOUNTER — Ambulatory Visit: Payer: 59 | Admitting: Cardiology

## 2019-11-15 ENCOUNTER — Other Ambulatory Visit: Payer: Self-pay

## 2019-11-15 ENCOUNTER — Encounter: Payer: Self-pay | Admitting: Cardiology

## 2019-11-15 VITALS — BP 128/80 | HR 88 | Ht 73.0 in | Wt 181.4 lb

## 2019-11-15 VITALS — BP 132/72 | HR 94 | Ht 73.0 in | Wt 181.4 lb

## 2019-11-15 DIAGNOSIS — E782 Mixed hyperlipidemia: Secondary | ICD-10-CM

## 2019-11-15 DIAGNOSIS — I483 Typical atrial flutter: Secondary | ICD-10-CM

## 2019-11-15 DIAGNOSIS — I10 Essential (primary) hypertension: Secondary | ICD-10-CM

## 2019-11-15 DIAGNOSIS — R002 Palpitations: Secondary | ICD-10-CM | POA: Diagnosis not present

## 2019-11-15 LAB — CBC WITH DIFFERENTIAL/PLATELET
Basophils Absolute: 0 10*3/uL (ref 0.0–0.2)
Basos: 0 %
EOS (ABSOLUTE): 0 10*3/uL (ref 0.0–0.4)
Eos: 0 %
Hematocrit: 50.2 % (ref 37.5–51.0)
Hemoglobin: 17.3 g/dL (ref 13.0–17.7)
Immature Grans (Abs): 0 10*3/uL (ref 0.0–0.1)
Immature Granulocytes: 0 %
Lymphocytes Absolute: 1.5 10*3/uL (ref 0.7–3.1)
Lymphs: 20 %
MCH: 31.2 pg (ref 26.6–33.0)
MCHC: 34.5 g/dL (ref 31.5–35.7)
MCV: 91 fL (ref 79–97)
Monocytes Absolute: 0.8 10*3/uL (ref 0.1–0.9)
Monocytes: 11 %
Neutrophils Absolute: 4.9 10*3/uL (ref 1.4–7.0)
Neutrophils: 69 %
Platelets: 219 10*3/uL (ref 150–450)
RBC: 5.54 x10E6/uL (ref 4.14–5.80)
RDW: 12.6 % (ref 11.6–15.4)
WBC: 7.3 10*3/uL (ref 3.4–10.8)

## 2019-11-15 LAB — BASIC METABOLIC PANEL
BUN/Creatinine Ratio: 15 (ref 10–24)
BUN: 17 mg/dL (ref 8–27)
CO2: 28 mmol/L (ref 20–29)
Calcium: 9.4 mg/dL (ref 8.6–10.2)
Chloride: 99 mmol/L (ref 96–106)
Creatinine, Ser: 1.14 mg/dL (ref 0.76–1.27)
GFR calc Af Amer: 79 mL/min/{1.73_m2} (ref >59)
GFR calc non Af Amer: 68 mL/min/{1.73_m2} (ref >59)
Glucose: 92 mg/dL (ref 65–99)
Potassium: 4.2 mmol/L (ref 3.5–5.2)
Sodium: 141 mmol/L (ref 134–144)

## 2019-11-15 MED ORDER — APIXABAN 5 MG PO TABS: 5.0000 mg | ORAL_TABLET | Freq: Two times a day (BID) | ORAL | 3 refills | Status: DC

## 2019-11-15 MED ORDER — METOPROLOL SUCCINATE ER 50 MG PO TB24: 50.0000 mg | ORAL_TABLET | Freq: Every day | ORAL | 1 refills | Status: DC

## 2019-11-15 NOTE — H&P (View-Only) (Signed)
Electrophysiology Office Note:    Date:  11/15/2019   ID:  Lonnie Palmer, DOB Mar 09, 1955, MRN [id number]  PCP:  Donald Prose, MD  [facility] Cardiologist:  Evalina Field, MD  [facility] Electrophysiologist:  None   Referring MD: Donald Prose, MD   Chief Complaint: palpitations, PACs, typical atrial flutter  History of Present Illness:    Lonnie Palmer is a 64 y.o. male who presents for an evaluation of typical atrial flutter at the request of Dr Audie Box. Their medical history includes HTN, HLD and PACs. He has felt extra heart beats for many years and brief runs of palpitations (15 years). He has sought care but has never been seen or had an ECG performed during an episode. No presyncope/syncope. He has taken his pulse and knows that it has been irregular in the past. He is active and able to exert himself without limitations.   Past Medical History:  Diagnosis Date  . Hyperlipidemia   . Hypertension     Past Surgical History:  Procedure Laterality Date  . LUMBAR DISC SURGERY      Current Medications: Current Meds  Medication Sig  . apixaban (ELIQUIS) 5 MG TABS tablet Take 1 tablet (5 mg total) by mouth 2 (two) times daily.  Marland Kitchen aspirin EC 81 MG tablet Take 81 mg by mouth daily. Swallow whole.  . cyclobenzaprine (FLEXERIL) 10 MG tablet Take 10 mg by mouth at bedtime as needed.  . ezetimibe (ZETIA) 10 MG tablet Take 10 mg by mouth daily.  Marland Kitchen HYDROcodone-acetaminophen (NORCO/VICODIN) 5-325 MG tablet Take 1 tablet by mouth every 6 (six) hours as needed for moderate pain.  Marland Kitchen lisinopril-hydrochlorothiazide (ZESTORETIC) 10-12.5 MG tablet Take 1 tablet by mouth daily.  . metoprolol succinate (TOPROL XL) 50 MG 24 hr tablet Take 1 tablet (50 mg total) by mouth daily.  . Multiple Vitamins-Minerals (VITRUM 50+ SENIOR MULTI PO) Take by mouth.  . simvastatin (ZOCOR) 20 MG tablet Take 20 mg by mouth daily.     Allergies:   Patient has no known allergies.   Social History    Socioeconomic History  . Marital status: Married    Spouse name: Not on file  . Number of children: 1  . Years of education: Not on file  . Highest education level: Not on file  Occupational History  . Occupation: Contractor  Tobacco Use  . Smoking status: Former Smoker    Packs/day: 0.50    Years: 6.00    Pack years: 3.00  . Smokeless tobacco: Never Used  Substance and Sexual Activity  . Alcohol use: Never  . Drug use: Never  . Sexual activity: Not on file  Other Topics Concern  . Not on file  Social History Narrative  . Not on file   Social Determinants of Health   Financial Resource Strain:   . Difficulty of Paying Living Expenses: Not on file  Food Insecurity:   . Worried About Charity fundraiser in the Last Year: Not on file  . Ran Out of Food in the Last Year: Not on file  Transportation Needs:   . Lack of Transportation (Medical): Not on file  . Lack of Transportation (Non-Medical): Not on file  Physical Activity:   . Days of Exercise per Week: Not on file  . Minutes of Exercise per Session: Not on file  Stress:   . Feeling of Stress : Not on file  Social Connections:   . Frequency of Communication  with Friends and Family: Not on file  . Frequency of Social Gatherings with Friends and Family: Not on file  . Attends Religious Services: Not on file  . Active Member of Clubs or Organizations: Not on file  . Attends Archivist Meetings: Not on file  . Marital Status: Not on file     Family History: The patient's family history includes Heart attack in his maternal grandfather.  ROS:   Please see the history of present illness.    All other systems reviewed and are negative.  EKGs/Labs/Other Studies Reviewed:    The following studies were reviewed today: ECG  EKG:  The ekg ordered today demonstrates typical atrial flutter.  Recent Labs: No results found for requested labs within last 8760 hours.  Recent Lipid Panel No  results found for: CHOL, TRIG, HDL, CHOLHDL, VLDL, LDLCALC, LDLDIRECT  Physical Exam:    VS:  BP 128/80   Pulse 88   Ht 6\' 1"  (1.854 m)   Wt 181 lb 6.4 oz (82.3 kg)   SpO2 98%   BMI 23.93 kg/m     Wt Readings from Last 3 Encounters:  11/15/19 181 lb 6.4 oz (82.3 kg)  11/15/19 181 lb 6.4 oz (82.3 kg)  08/27/19 182 lb 12.8 oz (82.9 kg)     GEN:  Well nourished, well developed in no acute distress HEENT: Normal NECK: No JVD; No carotid bruits LYMPHATICS: No lymphadenopathy CARDIAC: irregularly irregular, tachycardic, no murmurs, rubs, gallops RESPIRATORY:  Clear to auscultation without rales, wheezing or rhonchi  ABDOMEN: Soft, non-tender, non-distended MUSCULOSKELETAL:  No edema; No deformity  SKIN: Warm and dry NEUROLOGIC:  Alert and oriented x 3 PSYCHIATRIC:  Normal affect   ASSESSMENT:    1. Typical atrial flutter (HCC)   2. Palpitations   3. Essential hypertension    PLAN:    In order of problems listed above:  1. AFL His history is consistent with PACs over the past decade now able to trigger episodes of atrial flutter. I do not have any evidence of prior AF. I discussed management options for atrial arrhythmias including antiarrhythmics and ablation including the risks/benefits of each. He is interested in pursuing ablation. The ablation strategy will be dependent on whether there is concomitant atrial fibrillation. To accurately assess this, and in anticipation of the need for long term monitoring for AF/AFL, will implant a loop recorder after cardioverting out of his current arrhythmia. He will need a TEE prior to the DCCV given he is starting his anticoagulant today. Will go ahead and schedule an ablation for November. If between the loop implant and November ablation date there is atrial fibrillation detected, will plan for a PVI + CTI ablation. If there is no atrial fibrillation detected in that time window, will only perform CTI ablation and continue monitoring for  AF. Both ablation procedures were discussed in detail with the patient and he is interested in proceeding.  Risk, benefits, and alternatives to EP study and radiofrequency ablation for atrial flutter and fibrillation were also discussed in detail today. These risks include but are not limited to stroke, bleeding, vascular damage, tamponade, perforation, damage to the esophagus, lungs, and other structures, pulmonary vein stenosis, worsening renal function, and death. The patient understands these risk and wishes to proceed.  We will therefore proceed with catheter ablation at the next available time.  Carto, ICE, anesthesia are requested for the procedure.  The patient will have a TEE as part of the cardioversion so will defer  CT scan prior to the ablation procedure.   Medication Adjustments/Labs and Tests Ordered: Current medicines are reviewed at length with the patient today.  Concerns regarding medicines are outlined above.  Orders Placed This Encounter  Procedures  . Basic Metabolic Panel (BMET)  . CBC w/Diff   No orders of the defined types were placed in this encounter.    Signed, Lars Mage, MD, [facility]  11/15/2019 12:55 PM    Electrophysiology [place]

## 2019-11-15 NOTE — Patient Instructions (Signed)
Medication Instructions:  Increase Metoprolol to 50 mg daily  Start Eliquis 5 mg twice daily   *If you need a refill on your cardiac medications before your next appointment, please call your pharmacy*   Follow-Up: At [facility], you and your health needs are our priority.  As part of our continuing mission to provide you with exceptional heart care, we have created designated Provider Care Teams.  These Care Teams include your primary Cardiologist (physician) and Advanced Practice Providers (APPs -  Physician Assistants and Nurse Practitioners) who all work together to provide you with the care you need, when you need it.  We recommend signing up for the patient portal called "MyChart".  Sign up information is provided on this After Visit Summary.  MyChart is used to connect with patients for Virtual Visits (Telemedicine).  Patients are able to view lab/test results, encounter notes, upcoming appointments, etc.  Non-urgent messages can be sent to your provider as well.   To learn more about what you can do with MyChart, go to [email or web address].    Your next appointment:   3 month(s)  The format for your next appointment:   In Person  Provider:   Eleonore Chiquito, MD

## 2019-11-15 NOTE — Patient Instructions (Addendum)
Medication Instructions:  Your physician recommends that you continue on your current medications as directed. Please refer to the Current Medication list given to you today.  *If you need a refill on your cardiac medications before your next appointment, please call your pharmacy*  Lab Work: You will get lab work today:  BMP and CBC  If you have labs (blood work) drawn today and your tests are completely normal, you will receive your results only by: Marland Kitchen MyChart Message (if you have MyChart) OR . A paper copy in the mail If you have any lab test that is abnormal or we need to change your treatment, we will call you to review the results.  Testing/Procedures: Your physician has requested that you have a TEE. During a TEE, sound waves are used to create images of your heart. It provides your doctor with information about the size and shape of your heart and how well your heart's chambers and valves are working. In this test, a transducer is attached to the end of a flexible tube that's guided down your throat and into your esophagus (the tube leading from you mouth to your stomach) to get a more detailed image of your heart. You are not awake for the procedure. Please see the instruction sheet given to you today. For further information please visit [email or web address].  Your physician has recommended that you have a Cardioversion (DCCV). Electrical Cardioversion uses a jolt of electricity to your heart either through paddles or wired patches attached to your chest. This is a controlled, usually prescheduled, procedure. Defibrillation is done under light anesthesia in the hospital, and you usually go home the day of the procedure. This is done to get your heart back into a normal rhythm. You are not awake for the procedure. Please see the instruction sheet given to you today.  Follow-Up:  Cardioversion instructions:  Covid test:  September 21 at 9:00 am -- This is a Drive Up Visit at [address] [place], [place] [address] Someone will direct you to the appropriate testing line. Stay in your car and someone will be with you shortly.  After your covid test go home and quarantine until the day of your procedure.    You are scheduled for a TEE Cardioversion on November 21, 2019 with Dr. Loletha Grayer.   Please arrive at the [facility] (Main Entrance A) at [facility]: [place], [place] [address] at 10:00 am.   DIET: Nothing to eat or drink after midnight except a sip of water with medications (see medication instructions below)  Medication Instructions: TAKE all your morning medications with a sip of water  TAKE your ELIQUIS  You will need to continue your anticoagulant after your procedure until you  are told by your  Provider that it is safe to stop  You must have a responsible person to drive you home and stay in the waiting area during your procedure. Failure to do so could result in cancellation.  Bring your insurance cards.  *Special Note: Every effort is made to have your procedure done on time. Occasionally there are emergencies that occur at the hospital that may cause delays. Please be patient if a delay does occur.    Atrial Flutter ablation January 03, 2020  Cardiac Ablation Cardiac ablation is a procedure to disable (ablate) a small amount of heart tissue in very specific places. The heart has many electrical connections. Sometimes these connections are abnormal and can cause the heart to  beat very fast or irregularly. Ablating some of the problem areas can improve the heart rhythm or return it to normal. Ablation may be done for people who:  Have Wolff-Parkinson-White syndrome.  Have fast heart rhythms (tachycardia).  Have taken medicines for an abnormal heart rhythm (arrhythmia) that were not effective or caused side effects.  Have a high-risk heartbeat that may be life-threatening. During the procedure, a small incision is made in the neck or  the groin, and a long, thin, flexible tube (catheter) is inserted into the incision and moved to the heart. Small devices (electrodes) on the tip of the catheter will send out electrical currents. A type of X-ray (fluoroscopy) will be used to help guide the catheter and to provide images of the heart. Tell a health care provider about:  Any allergies you have.  All medicines you are taking, including vitamins, herbs, eye drops, creams, and over-the-counter medicines.  Any problems you or family members have had with anesthetic medicines.  Any blood disorders you have.  Any surgeries you have had.  Any medical conditions you have, such as kidney failure.  Whether you are pregnant or may be pregnant. What are the risks? Generally, this is a safe procedure. However, problems may occur, including:  Infection.  Bruising and bleeding at the catheter insertion site.  Bleeding into the chest, especially into the sac that surrounds the heart. This is a serious complication.  Stroke or blood clots.  Damage to other structures or organs.  Allergic reaction to medicines or dyes.  Need for a permanent pacemaker if the normal electrical system is damaged. A pacemaker is a small computer that sends electrical signals to the heart and helps your heart beat normally.  The procedure not being fully effective. This may not be recognized until months later. Repeat ablation procedures are sometimes required. What happens before the procedure?  Follow instructions from your health care provider about eating or drinking restrictions.  Ask your health care provider about: ? Changing or stopping your regular medicines. This is especially important if you are taking diabetes medicines or blood thinners. ? Taking medicines such as aspirin and ibuprofen. These medicines can thin your blood. Do not take these medicines before your procedure if your health care provider instructs you not to.  Plan to  have someone take you home from the hospital or clinic.  If you will be going home right after the procedure, plan to have someone with you for 24 hours. What happens during the procedure?  To lower your risk of infection: ? Your health care team will wash or sanitize their hands. ? Your skin will be washed with soap. ? Hair may be removed from the incision area.  An IV tube will be inserted into one of your veins.  You will be given a medicine to help you relax (sedative).  The skin on your neck or groin will be numbed.  An incision will be made in your neck or your groin.  A needle will be inserted through the incision and into a large vein in your neck or groin.  A catheter will be inserted into the needle and moved to your heart.  Dye may be injected through the catheter to help your surgeon see the area of the heart that needs treatment.  Electrical currents will be sent from the catheter to ablate heart tissue in desired areas. There are three types of energy that may be used to ablate heart tissue: ? Heat (radiofrequency  energy). ? Laser energy. ? Extreme cold (cryoablation).  When the necessary tissue has been ablated, the catheter will be removed.  Pressure will be held on the catheter insertion area to prevent excessive bleeding.  A bandage (dressing) will be placed over the catheter insertion area. The procedure may vary among health care providers and hospitals. What happens after the procedure?  Your blood pressure, heart rate, breathing rate, and blood oxygen level will be monitored until the medicines you were given have worn off.  Your catheter insertion area will be monitored for bleeding. You will need to lie still for a few hours to ensure that you do not bleed from the catheter insertion area.  Do not drive for 24 hours or as long as directed by your health care provider. Summary  Cardiac ablation is a procedure to disable (ablate) a small amount of  heart tissue in very specific places. Ablating some of the problem areas can improve the heart rhythm or return it to normal.  During the procedure, electrical currents will be sent from the catheter to ablate heart tissue in desired areas. This information is not intended to replace advice given to you by your health care provider. Make sure you discuss any questions you have with your health care provider. Document Revised: 08/07/2017 Document Reviewed: 01/04/2016 Elsevier Patient Education  [place].

## 2019-11-15 NOTE — Progress Notes (Signed)
Electrophysiology Office Note:    Date:  11/15/2019   ID:  Lonnie Palmer, DOB [birth year], MRN [id number]  PCP:  Donald Prose, MD  [facility] Cardiologist:  Evalina Field, MD  [facility] Electrophysiologist:  None   Referring MD: Donald Prose, MD   Chief Complaint: palpitations, PACs, typical atrial flutter  History of Present Illness:    Lonnie Palmer is a 64 y.o. male who presents for an evaluation of typical atrial flutter at the request of Dr Audie Box. Their medical history includes HTN, HLD and PACs. He has felt extra heart beats for many years and brief runs of palpitations (15 years). He has sought care but has never been seen or had an ECG performed during an episode. No presyncope/syncope. He has taken his pulse and knows that it has been irregular in the past. He is active and able to exert himself without limitations.   Past Medical History:  Diagnosis Date  . Hyperlipidemia   . Hypertension     Past Surgical History:  Procedure Laterality Date  . LUMBAR DISC SURGERY      Current Medications: Current Meds  Medication Sig  . apixaban (ELIQUIS) 5 MG TABS tablet Take 1 tablet (5 mg total) by mouth 2 (two) times daily.  Marland Kitchen aspirin EC 81 MG tablet Take 81 mg by mouth daily. Swallow whole.  . cyclobenzaprine (FLEXERIL) 10 MG tablet Take 10 mg by mouth at bedtime as needed.  . ezetimibe (ZETIA) 10 MG tablet Take 10 mg by mouth daily.  Marland Kitchen HYDROcodone-acetaminophen (NORCO/VICODIN) 5-325 MG tablet Take 1 tablet by mouth every 6 (six) hours as needed for moderate pain.  Marland Kitchen lisinopril-hydrochlorothiazide (ZESTORETIC) 10-12.5 MG tablet Take 1 tablet by mouth daily.  . metoprolol succinate (TOPROL XL) 50 MG 24 hr tablet Take 1 tablet (50 mg total) by mouth daily.  . Multiple Vitamins-Minerals (VITRUM 50+ SENIOR MULTI PO) Take by mouth.  . simvastatin (ZOCOR) 20 MG tablet Take 20 mg by mouth daily.     Allergies:   Patient has no known allergies.   Social History     Socioeconomic History  . Marital status: Married    Spouse name: Not on file  . Number of children: 1  . Years of education: Not on file  . Highest education level: Not on file  Occupational History  . Occupation: Contractor  Tobacco Use  . Smoking status: Former Smoker    Packs/day: 0.50    Years: 6.00    Pack years: 3.00  . Smokeless tobacco: Never Used  Substance and Sexual Activity  . Alcohol use: Never  . Drug use: Never  . Sexual activity: Not on file  Other Topics Concern  . Not on file  Social History Narrative  . Not on file   Social Determinants of Health   Financial Resource Strain:   . Difficulty of Paying Living Expenses: Not on file  Food Insecurity:   . Worried About Charity fundraiser in the Last Year: Not on file  . Ran Out of Food in the Last Year: Not on file  Transportation Needs:   . Lack of Transportation (Medical): Not on file  . Lack of Transportation (Non-Medical): Not on file  Physical Activity:   . Days of Exercise per Week: Not on file  . Minutes of Exercise per Session: Not on file  Stress:   . Feeling of Stress : Not on file  Social Connections:   . Frequency of  Communication with Friends and Family: Not on file  . Frequency of Social Gatherings with Friends and Family: Not on file  . Attends Religious Services: Not on file  . Active Member of Clubs or Organizations: Not on file  . Attends Archivist Meetings: Not on file  . Marital Status: Not on file     Family History: The patient's family history includes Heart attack in his maternal grandfather.  ROS:   Please see the history of present illness.    All other systems reviewed and are negative.  EKGs/Labs/Other Studies Reviewed:    The following studies were reviewed today: ECG  EKG:  The ekg ordered today demonstrates typical atrial flutter.  Recent Labs: No results found for requested labs within last 8760 hours.  Recent Lipid Panel No  results found for: CHOL, TRIG, HDL, CHOLHDL, VLDL, LDLCALC, LDLDIRECT  Physical Exam:    VS:  BP 128/80   Pulse 88   Ht 6\' 1"  (1.854 m)   Wt 181 lb 6.4 oz (82.3 kg)   SpO2 98%   BMI 23.93 kg/m     Wt Readings from Last 3 Encounters:  11/15/19 181 lb 6.4 oz (82.3 kg)  11/15/19 181 lb 6.4 oz (82.3 kg)  08/27/19 182 lb 12.8 oz (82.9 kg)     GEN:  Well nourished, well developed in no acute distress HEENT: Normal NECK: No JVD; No carotid bruits LYMPHATICS: No lymphadenopathy CARDIAC: irregularly irregular, tachycardic, no murmurs, rubs, gallops RESPIRATORY:  Clear to auscultation without rales, wheezing or rhonchi  ABDOMEN: Soft, non-tender, non-distended MUSCULOSKELETAL:  No edema; No deformity  SKIN: Warm and dry NEUROLOGIC:  Alert and oriented x 3 PSYCHIATRIC:  Normal affect   ASSESSMENT:    1. Typical atrial flutter (HCC)   2. Palpitations   3. Essential hypertension    PLAN:    In order of problems listed above:  1. AFL His history is consistent with PACs over the past decade now able to trigger episodes of atrial flutter. I do not have any evidence of prior AF. I discussed management options for atrial arrhythmias including antiarrhythmics and ablation including the risks/benefits of each. He is interested in pursuing ablation. The ablation strategy will be dependent on whether there is concomitant atrial fibrillation. To accurately assess this, and in anticipation of the need for long term monitoring for AF/AFL, will implant a loop recorder after cardioverting out of his current arrhythmia. He will need a TEE prior to the DCCV given he is starting his anticoagulant today. Will go ahead and schedule an ablation for November. If between the loop implant and November ablation date there is atrial fibrillation detected, will plan for a PVI + CTI ablation. If there is no atrial fibrillation detected in that time window, will only perform CTI ablation and continue monitoring for  AF. Both ablation procedures were discussed in detail with the patient and he is interested in proceeding.  Risk, benefits, and alternatives to EP study and radiofrequency ablation for atrial flutter and fibrillation were also discussed in detail today. These risks include but are not limited to stroke, bleeding, vascular damage, tamponade, perforation, damage to the esophagus, lungs, and other structures, pulmonary vein stenosis, worsening renal function, and death. The patient understands these risk and wishes to proceed.  We will therefore proceed with catheter ablation at the next available time.  Carto, ICE, anesthesia are requested for the procedure.  The patient will have a TEE as part of the cardioversion so will  defer CT scan prior to the ablation procedure.   Medication Adjustments/Labs and Tests Ordered: Current medicines are reviewed at length with the patient today.  Concerns regarding medicines are outlined above.  Orders Placed This Encounter  Procedures  . Basic Metabolic Panel (BMET)  . CBC w/Diff   No orders of the defined types were placed in this encounter.    Signed, Lars Mage, MD, [facility]  11/15/2019 12:55 PM    Electrophysiology [place]

## 2019-11-19 ENCOUNTER — Ambulatory Visit: Payer: Self-pay | Admitting: Cardiology

## 2019-11-19 ENCOUNTER — Other Ambulatory Visit (HOSPITAL_COMMUNITY)
Admission: RE | Admit: 2019-11-19 | Discharge: 2019-11-19 | Disposition: A | Discharge status: Home / Self Care | Payer: 59 | Source: Ambulatory Visit | Attending: Internal Medicine | Admitting: Internal Medicine

## 2019-11-19 ENCOUNTER — Telehealth: Payer: Self-pay | Admitting: Cardiovascular Disease

## 2019-11-19 DIAGNOSIS — Z20822 Contact with and (suspected) exposure to covid-19: Secondary | ICD-10-CM | POA: Insufficient documentation

## 2019-11-19 DIAGNOSIS — Z01812 Encounter for preprocedural laboratory examination: Secondary | ICD-10-CM | POA: Diagnosis present

## 2019-11-19 LAB — SARS CORONAVIRUS 2 (TAT 6-24 HRS): SARS Coronavirus 2: NEGATIVE

## 2019-11-19 NOTE — Telephone Encounter (Signed)
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 He states they are non symptomatic. He states his daughter in law told him to let the office know, so that while under anesthesia he will need special care and not to move his neck in certain positions.

## 2019-11-19 NOTE — Telephone Encounter (Signed)
Sent mychart message to Pt advising to let Short Stay nurse know on arrival about his neck issues.

## 2019-11-21 ENCOUNTER — Ambulatory Visit (HOSPITAL_COMMUNITY): Payer: 59 | Admitting: Anesthesiology

## 2019-11-21 ENCOUNTER — Encounter (HOSPITAL_COMMUNITY): Payer: Self-pay

## 2019-11-21 ENCOUNTER — Encounter (HOSPITAL_COMMUNITY)
Admission: RE | Disposition: A | Discharge status: Home / Self Care | Payer: Self-pay | Source: Home / Self Care | Attending: Internal Medicine

## 2019-11-21 ENCOUNTER — Encounter (HOSPITAL_COMMUNITY)
Admission: RE | Disposition: A | Discharge status: Home / Self Care | Payer: 59 | Source: Home / Self Care | Attending: Internal Medicine

## 2019-11-21 ENCOUNTER — Ambulatory Visit (HOSPITAL_COMMUNITY)
Admission: RE | Admit: 2019-11-21 | Discharge: 2019-11-21 | Disposition: A | Discharge status: Home / Self Care | Payer: 59 | Attending: Internal Medicine | Admitting: Internal Medicine

## 2019-11-21 ENCOUNTER — Ambulatory Visit (HOSPITAL_COMMUNITY): Payer: 59

## 2019-11-21 ENCOUNTER — Encounter (HOSPITAL_COMMUNITY): Payer: Self-pay | Admitting: Internal Medicine

## 2019-11-21 DIAGNOSIS — I10 Essential (primary) hypertension: Secondary | ICD-10-CM | POA: Insufficient documentation

## 2019-11-21 DIAGNOSIS — Z79899 Other long term (current) drug therapy: Secondary | ICD-10-CM | POA: Diagnosis not present

## 2019-11-21 DIAGNOSIS — E785 Hyperlipidemia, unspecified: Secondary | ICD-10-CM | POA: Insufficient documentation

## 2019-11-21 DIAGNOSIS — I483 Typical atrial flutter: Secondary | ICD-10-CM | POA: Diagnosis not present

## 2019-11-21 DIAGNOSIS — R002 Palpitations: Secondary | ICD-10-CM | POA: Insufficient documentation

## 2019-11-21 DIAGNOSIS — Z7901 Long term (current) use of anticoagulants: Secondary | ICD-10-CM | POA: Diagnosis not present

## 2019-11-21 DIAGNOSIS — Z87891 Personal history of nicotine dependence: Secondary | ICD-10-CM | POA: Insufficient documentation

## 2019-11-21 DIAGNOSIS — Z7982 Long term (current) use of aspirin: Secondary | ICD-10-CM | POA: Insufficient documentation

## 2019-11-21 DIAGNOSIS — I4892 Unspecified atrial flutter: Secondary | ICD-10-CM | POA: Diagnosis not present

## 2019-11-21 HISTORY — PX: LOOP RECORDER INSERTION: EP1214

## 2019-11-21 SURGERY — LOOP RECORDER INSERTION

## 2019-11-21 SURGERY — CANCELLED PROCEDURE

## 2019-11-21 MED ORDER — LIDOCAINE HCL (PF) 1 % IJ SOLN
INTRAMUSCULAR | Status: DC | PRN
  Administered 2019-11-21: 2 mL

## 2019-11-21 MED ORDER — LIDOCAINE-EPINEPHRINE 1 %-1:100000 IJ SOLN
INTRAMUSCULAR | Status: AC
  Filled 2019-11-21: qty 1

## 2019-11-21 SURGICAL SUPPLY — 2 items
MONITOR REVEAL LINQ II (Prosthesis & Implant Heart) ×1 IMPLANT
PACK LOOP INSERTION (CUSTOM PROCEDURE TRAY) ×2 IMPLANT

## 2019-11-21 NOTE — Progress Notes (Signed)
Pt transferred to cath lab for loop recorder placement.

## 2019-11-21 NOTE — Progress Notes (Signed)
Pt given discharge instructions and was able to verbalized them.  Clean dry dressing applied to site. Pt discharged per MD order.

## 2019-11-21 NOTE — Progress Notes (Signed)
Pt is in sinus rhythm prior to TEE/CV. Confirmed with EKG. Dr. Gasper Sells aware. TEE/CV cancelled per MD. Pt is to have loop recorder placement.

## 2019-11-21 NOTE — Progress Notes (Signed)
Pt received from Endo to cath lab holding for a Loop Recorder Implant by Dr Quentin Ore.

## 2019-11-26 ENCOUNTER — Telehealth: Payer: Self-pay | Admitting: Cardiology

## 2019-11-26 NOTE — Telephone Encounter (Signed)
Calling patient in regards to his question.   Patient has questions regarding wound dressings, what dressing to take off or leave on. Patient also expressed his concerns about confusion from what staff informed him at the hospital in contrast to his d/c paper work. Patient states the paper work is different than what he was told. Discuss follow-up with Dr. Quentin Ore in office on 12/19/19. Patient will send an email and I will assess wound to the best of my ability.

## 2019-11-26 NOTE — Telephone Encounter (Signed)
    Pt would like to speak with Dr. Mardene Speak nurse Sonia Baller. He said he have some questions.

## 2019-11-26 NOTE — Telephone Encounter (Signed)
Photos received in email. Patient gave consent to attach picture to chart. When patient removed dressing, ster-stipes came off as well. Patient reports wound is closed. Denies any bleeding or drainage to site. Advised patient if he notes any bleeding, swelling, drainage, fever or chills to call the device clinic. Verbalizes understanding.   Patient expresses he would like to reschedule his follow-up with Dr. Quentin Ore.Advsied him I would forward this the he scheduler and she will give him a call. Agrees to plan.

## 2019-12-02 ENCOUNTER — Telehealth: Payer: Self-pay

## 2019-12-02 NOTE — Telephone Encounter (Signed)
Pt sent following email:  Myrene Galas, MD 1 hour ago (10:24 AM)  MP Since I can only initiate messages to you Dr. Audie Box... had some weird symptoms Sunday morning... uneasy soreness/tightness in chest area.Marland KitchenMarland Kitchenbp was up and pulse rate up... both resolved after a couple hours...both Saturday and Sunday nights after going to bed felt like more harder beats but actual rate was good...9s and 70s. Can the data from loop recorder be analyzed for Saturday night, Sunday morning and Sunday night for rhythm abnormalities? I was told by the [employer] rep at the cath lab that I'd be able to make entries via the app for symptom marking... but that's not available to me for some reason. Im feeling totally normal today!! Thanks for hearing me out. You are the only one that My Chart lets me send messages to. Sorry to be a bother.   Transmissions received 12/01/19 at 12:38 am 12/01/19 at 12:05pm These brief episodes do not exactly explain ptsymptoms.  Forwarding to MD for review./

## 2019-12-05 NOTE — Interval H&P Note (Signed)
History and Physical Interval Note:  12/05/2019 8:51 PM  Lonnie Palmer  has presented today for surgery, with the diagnosis of a flutter.  The various methods of treatment have been discussed with the patient and family. After consideration of risks, benefits and other options for treatment, the patient has consented to  Procedure(s): LOOP RECORDER INSERTION (N/A) as a surgical intervention.  The patient's history has been reviewed, patient examined, no change in status, stable for surgery.  I have reviewed the patient's chart and labs.  Questions were answered to the patient's satisfaction.     Ramiya Delahunty T Danna Casella

## 2019-12-19 ENCOUNTER — Encounter: Payer: 59 | Admitting: Cardiology

## 2019-12-24 ENCOUNTER — Encounter: Payer: Self-pay | Admitting: Cardiology

## 2019-12-24 ENCOUNTER — Telehealth: Payer: Self-pay

## 2019-12-24 ENCOUNTER — Other Ambulatory Visit: Payer: Self-pay

## 2019-12-24 ENCOUNTER — Ambulatory Visit (INDEPENDENT_AMBULATORY_CARE_PROVIDER_SITE_OTHER): Payer: 59

## 2019-12-24 ENCOUNTER — Ambulatory Visit (INDEPENDENT_AMBULATORY_CARE_PROVIDER_SITE_OTHER): Payer: 59 | Admitting: Cardiology

## 2019-12-24 ENCOUNTER — Encounter: Payer: Self-pay | Admitting: *Deleted

## 2019-12-24 VITALS — BP 128/76 | HR 74 | Ht 73.0 in | Wt 182.4 lb

## 2019-12-24 DIAGNOSIS — R002 Palpitations: Secondary | ICD-10-CM | POA: Diagnosis not present

## 2019-12-24 DIAGNOSIS — I10 Essential (primary) hypertension: Secondary | ICD-10-CM | POA: Diagnosis not present

## 2019-12-24 DIAGNOSIS — E782 Mixed hyperlipidemia: Secondary | ICD-10-CM

## 2019-12-24 DIAGNOSIS — I4891 Unspecified atrial fibrillation: Secondary | ICD-10-CM

## 2019-12-24 DIAGNOSIS — I483 Typical atrial flutter: Secondary | ICD-10-CM

## 2019-12-24 DIAGNOSIS — I480 Paroxysmal atrial fibrillation: Secondary | ICD-10-CM

## 2019-12-24 MED ORDER — FLECAINIDE ACETATE 100 MG PO TABS: 100.0000 mg | ORAL_TABLET | Freq: Two times a day (BID) | ORAL | 3 refills | Status: DC

## 2019-12-24 NOTE — H&P (View-Only) (Signed)
Electrophysiology Office Follow up Visit Note:    Date:  12/24/2019   ID:  Lonnie Palmer, DOB 1955-05-13, MRN [id number]  PCP:  Donald Prose, MD  [facility] Cardiologist:  Evalina Field, MD  [place] Electrophysiologist:  Vickie Epley, MD    Interval History:    Lonnie Palmer is a 64 y.o. male who presents for a follow up visit. They were last seen in clinic November 15, 2019 for atrial flutter.  He was subsequently cardioverted and a loop recorder placed to help assess for atrial fibrillation prior to a planned ablation procedure November 5.  He has been doing well since his last appointment and tolerating his Eliquis and metoprolol.  Since their last appointment, he continues to experience symptomatic paroxysms of tachycardia.  Review of his loop interrogation today shows episodes of atrial fibrillation and atrial flutter.  He continues to be very active including playing golf but he says he feels winded at times playing golf.  He checks his pulse frequently and during these episodes of feeling winded, his heart rates are typically in the 100-130 range.     Past Medical History:  Diagnosis Date  . Hyperlipidemia   . Hypertension     Past Surgical History:  Procedure Laterality Date  . LOOP RECORDER INSERTION N/A 11/21/2019   Procedure: LOOP RECORDER INSERTION;  Surgeon: Vickie Epley, MD;  Location: [place] [facility];  Service: Cardiovascular;  Laterality: N/A;  . LUMBAR DISC SURGERY      Current Medications: Current Meds  Medication Sig  . apixaban (ELIQUIS) 5 MG TABS tablet Take 1 tablet (5 mg total) by mouth 2 (two) times daily.  Marland Kitchen aspirin EC 81 MG tablet Take 81 mg by mouth daily. Swallow whole.  . cyclobenzaprine (FLEXERIL) 10 MG tablet Take 10 mg by mouth at bedtime.   Marland Kitchen ezetimibe (ZETIA) 10 MG tablet Take 10 mg by mouth daily.  Marland Kitchen HYDROcodone-acetaminophen (NORCO/VICODIN) 5-325 MG tablet Take 1 tablet by mouth every 6 (six) hours as  needed for moderate pain.  Marland Kitchen ketoconazole (NIZORAL) 2 % cream Apply 1 application topically daily.  Marland Kitchen lisinopril-hydrochlorothiazide (ZESTORETIC) 10-12.5 MG tablet Take 1 tablet by mouth daily.  . metoprolol succinate (TOPROL XL) 50 MG 24 hr tablet Take 1 tablet (50 mg total) by mouth daily.  . Multiple Vitamins-Minerals (VITRUM 50+ SENIOR MULTI PO) Take by mouth.  . simvastatin (ZOCOR) 20 MG tablet Take 20 mg by mouth daily.     Allergies:   Patient has no known allergies.   Social History   Socioeconomic History  . Marital status: Married    Spouse name: Not on file  . Number of children: 1  . Years of education: Not on file  . Highest education level: Not on file  Occupational History  . Occupation: Contractor  Tobacco Use  . Smoking status: Former Smoker    Packs/day: 0.50    Years: 6.00    Pack years: 3.00  . Smokeless tobacco: Never Used  Substance and Sexual Activity  . Alcohol use: Never  . Drug use: Never  . Sexual activity: Not on file  Other Topics Concern  . Not on file  Social History Narrative  . Not on file   Social Determinants of Health   Financial Resource Strain:   . Difficulty of Paying Living Expenses: Not on file  Food Insecurity:   . Worried About Charity fundraiser in the Last Year: Not on file  .  Ran Out of Food in the Last Year: Not on file  Transportation Needs:   . Lack of Transportation (Medical): Not on file  . Lack of Transportation (Non-Medical): Not on file  Physical Activity:   . Days of Exercise per Week: Not on file  . Minutes of Exercise per Session: Not on file  Stress:   . Feeling of Stress : Not on file  Social Connections:   . Frequency of Communication with Friends and Family: Not on file  . Frequency of Social Gatherings with Friends and Family: Not on file  . Attends Religious Services: Not on file  . Active Member of Clubs or Organizations: Not on file  . Attends Archivist Meetings: Not on  file  . Marital Status: Not on file     Family History: The patient's family history includes Heart attack in his maternal grandfather.  ROS:   Please see the history of present illness.    All other systems reviewed and are negative.  EKGs/Labs/Other Studies Reviewed:    The following studies were reviewed today: In person device interrogation  December 24, 2019 loop interrogation personally reviewed Episodes of atrial fibrillation and atrial flutter.  2.5% burden.  EKG:  The ekg ordered today demonstrates sinus rhythm with PACs.  Recent Labs: 11/15/2019: BUN 17; Creatinine, Ser 1.14; Hemoglobin 17.3; Platelets 219; Potassium 4.2; Sodium 141  Recent Lipid Panel No results found for: CHOL, TRIG, HDL, CHOLHDL, VLDL, LDLCALC, LDLDIRECT  Physical Exam:    VS:  BP 128/76   Pulse 74   Ht 6\' 1"  (1.854 m)   Wt 182 lb 6.4 oz (82.7 kg)   SpO2 93%   BMI 24.06 kg/m     Wt Readings from Last 3 Encounters:  12/24/19 182 lb 6.4 oz (82.7 kg)  11/15/19 181 lb 6.4 oz (82.3 kg)  11/15/19 181 lb 6.4 oz (82.3 kg)     GEN:  Well nourished, well developed in no acute distress HEENT: Normal NECK: No JVD; No carotid bruits LYMPHATICS: No lymphadenopathy CARDIAC: RRR, no murmurs, rubs, gallops RESPIRATORY:  Clear to auscultation without rales, wheezing or rhonchi  ABDOMEN: Soft, non-tender, non-distended MUSCULOSKELETAL:  No edema; No deformity  SKIN: Warm and dry NEUROLOGIC:  Alert and oriented x 3 PSYCHIATRIC:  Normal affect   ASSESSMENT:    1. Typical atrial flutter ([place])   2. Essential hypertension   3. Mixed hyperlipidemia    PLAN:    In order of problems listed above:  1. Typical atrial flutter and atrial fibrillation On Eliquis and metoprolol.  Continues to have symptomatic paroxysms of atrial fibrillation and flutter on medical therapy.  Had an extensive conversation with the patient during clinic today about the options.  We discussed medical therapy with antiarrhythmic  medications (flecainide).  We also discussed ablation therapy for atrial fibrillation and flutter.  Discussed the risk and benefits of each option.  He would like to start with medical therapy and use the loop recorder to assess response.  We will initiate flecainide 100 mg twice daily and get a treadmill ECG test next week.  We will plan to see back in 6 weeks to assess for any side effects and the response to therapy.  We discussed the natural history of atrial fibrillation and flutter and a probable transition from paroxysmal to persistent.  From reviewing his rate histograms it looks like he is having near daily episodes of arrhythmias.    2.  Hypertension controlled on his current medications. Continue  lisinopril-hydrochlorothiazide and metoprolol.    Medication Adjustments/Labs and Tests Ordered: Current medicines are reviewed at length with the patient today.  Concerns regarding medicines are outlined above.  Orders Placed This Encounter  Procedures  . Exercise Tolerance Test  . EKG 12-Lead   Meds ordered this encounter  Medications  . flecainide (TAMBOCOR) 100 MG tablet    Sig: Take 1 tablet (100 mg total) by mouth 2 (two) times daily.    Dispense:  180 tablet    Refill:  3     Signed, Lars Mage, MD, [facility]  12/24/2019 10:30 AM    Electrophysiology [facility]

## 2019-12-24 NOTE — Addendum Note (Signed)
Addended by: Willeen Cass A on: 12/24/2019 01:55 PM   Modules accepted: Orders

## 2019-12-24 NOTE — Patient Instructions (Addendum)
LATE entry- Pt called back requesting to proceed with ablation. Flecainide and stress test cancelled. Dr. Quentin Ore aware.    Medication Instructions:  Start taking Flecainide 100 mg two times a day  *If you need a refill on your cardiac medications before your next appointment, please call your pharmacy*  Lab Work: None ordered.  If you have labs (blood work) drawn today and your tests are completely normal, you will receive your results only by: Marland Kitchen MyChart Message (if you have MyChart) OR . A paper copy in the mail If you have any lab test that is abnormal or we need to change your treatment, we will call you to review the results.  Testing/Procedures: Please schedule GXT and covid prior to.  Your physician has requested that you have an exercise tolerance test. For further information please visit [email or web address]. Please also follow instruction sheet, as given.  Follow-Up: At [facility], you and your health needs are our priority.  As part of our continuing mission to provide you with exceptional heart care, we have created designated Provider Care Teams.  These Care Teams include your primary Cardiologist (physician) and Advanced Practice Providers (APPs -  Physician Assistants and Nurse Practitioners) who all work together to provide you with the care you need, when you need it.  We recommend signing up for the patient portal called "MyChart".  Sign up information is provided on this After Visit Summary.  MyChart is used to connect with patients for Virtual Visits (Telemedicine).  Patients are able to view lab/test results, encounter notes, upcoming appointments, etc.  Non-urgent messages can be sent to your provider as well.   To learn more about what you can do with MyChart, go to [email or web address].    Your next appointment:   Your physician wants you to follow-up in: 02/05/20 1:45 pm    Other Instructions:  Canceled previously scheduled ablation.

## 2019-12-24 NOTE — Progress Notes (Signed)
Electrophysiology Office Follow up Visit Note:    Date:  12/24/2019   ID:  Lonnie Palmer, DOB 10/11/1955, MRN [id number]  PCP:  Donald Prose, MD  [facility] Cardiologist:  Evalina Field, MD  [place] Electrophysiologist:  Vickie Epley, MD    Interval History:    Lonnie Palmer is a 64 y.o. male who presents for a follow up visit. They were last seen in clinic November 15, 2019 for atrial flutter.  He was subsequently cardioverted and a loop recorder placed to help assess for atrial fibrillation prior to a planned ablation procedure November 5.  He has been doing well since his last appointment and tolerating his Eliquis and metoprolol.  Since their last appointment, he continues to experience symptomatic paroxysms of tachycardia.  Review of his loop interrogation today shows episodes of atrial fibrillation and atrial flutter.  He continues to be very active including playing golf but he says he feels winded at times playing golf.  He checks his pulse frequently and during these episodes of feeling winded, his heart rates are typically in the 100-130 range.     Past Medical History:  Diagnosis Date  . Hyperlipidemia   . Hypertension     Past Surgical History:  Procedure Laterality Date  . LOOP RECORDER INSERTION N/A 11/21/2019   Procedure: LOOP RECORDER INSERTION;  Surgeon: Vickie Epley, MD;  Location: [place] [facility];  Service: Cardiovascular;  Laterality: N/A;  . LUMBAR DISC SURGERY      Current Medications: Current Meds  Medication Sig  . apixaban (ELIQUIS) 5 MG TABS tablet Take 1 tablet (5 mg total) by mouth 2 (two) times daily.  Marland Kitchen aspirin EC 81 MG tablet Take 81 mg by mouth daily. Swallow whole.  . cyclobenzaprine (FLEXERIL) 10 MG tablet Take 10 mg by mouth at bedtime.   Marland Kitchen ezetimibe (ZETIA) 10 MG tablet Take 10 mg by mouth daily.  Marland Kitchen HYDROcodone-acetaminophen (NORCO/VICODIN) 5-325 MG tablet Take 1 tablet by mouth every 6 (six) hours as  needed for moderate pain.  Marland Kitchen ketoconazole (NIZORAL) 2 % cream Apply 1 application topically daily.  Marland Kitchen lisinopril-hydrochlorothiazide (ZESTORETIC) 10-12.5 MG tablet Take 1 tablet by mouth daily.  . metoprolol succinate (TOPROL XL) 50 MG 24 hr tablet Take 1 tablet (50 mg total) by mouth daily.  . Multiple Vitamins-Minerals (VITRUM 50+ SENIOR MULTI PO) Take by mouth.  . simvastatin (ZOCOR) 20 MG tablet Take 20 mg by mouth daily.     Allergies:   Patient has no known allergies.   Social History   Socioeconomic History  . Marital status: Married    Spouse name: Not on file  . Number of children: 1  . Years of education: Not on file  . Highest education level: Not on file  Occupational History  . Occupation: Contractor  Tobacco Use  . Smoking status: Former Smoker    Packs/day: 0.50    Years: 6.00    Pack years: 3.00  . Smokeless tobacco: Never Used  Substance and Sexual Activity  . Alcohol use: Never  . Drug use: Never  . Sexual activity: Not on file  Other Topics Concern  . Not on file  Social History Narrative  . Not on file   Social Determinants of Health   Financial Resource Strain:   . Difficulty of Paying Living Expenses: Not on file  Food Insecurity:   . Worried About Charity fundraiser in the Last Year: Not on file  .  Ran Out of Food in the Last Year: Not on file  Transportation Needs:   . Lack of Transportation (Medical): Not on file  . Lack of Transportation (Non-Medical): Not on file  Physical Activity:   . Days of Exercise per Week: Not on file  . Minutes of Exercise per Session: Not on file  Stress:   . Feeling of Stress : Not on file  Social Connections:   . Frequency of Communication with Friends and Family: Not on file  . Frequency of Social Gatherings with Friends and Family: Not on file  . Attends Religious Services: Not on file  . Active Member of Clubs or Organizations: Not on file  . Attends Archivist Meetings: Not on  file  . Marital Status: Not on file     Family History: The patient's family history includes Heart attack in his maternal grandfather.  ROS:   Please see the history of present illness.    All other systems reviewed and are negative.  EKGs/Labs/Other Studies Reviewed:    The following studies were reviewed today: In person device interrogation  December 24, 2019 loop interrogation personally reviewed Episodes of atrial fibrillation and atrial flutter.  2.5% burden.  EKG:  The ekg ordered today demonstrates sinus rhythm with PACs.  Recent Labs: 11/15/2019: BUN 17; Creatinine, Ser 1.14; Hemoglobin 17.3; Platelets 219; Potassium 4.2; Sodium 141  Recent Lipid Panel No results found for: CHOL, TRIG, HDL, CHOLHDL, VLDL, LDLCALC, LDLDIRECT  Physical Exam:    VS:  BP 128/76   Pulse 74   Ht 6\' 1"  (1.854 m)   Wt 182 lb 6.4 oz (82.7 kg)   SpO2 93%   BMI 24.06 kg/m     Wt Readings from Last 3 Encounters:  12/24/19 182 lb 6.4 oz (82.7 kg)  11/15/19 181 lb 6.4 oz (82.3 kg)  11/15/19 181 lb 6.4 oz (82.3 kg)     GEN:  Well nourished, well developed in no acute distress HEENT: Normal NECK: No JVD; No carotid bruits LYMPHATICS: No lymphadenopathy CARDIAC: RRR, no murmurs, rubs, gallops RESPIRATORY:  Clear to auscultation without rales, wheezing or rhonchi  ABDOMEN: Soft, non-tender, non-distended MUSCULOSKELETAL:  No edema; No deformity  SKIN: Warm and dry NEUROLOGIC:  Alert and oriented x 3 PSYCHIATRIC:  Normal affect   ASSESSMENT:    1. Typical atrial flutter ([place])   2. Essential hypertension   3. Mixed hyperlipidemia    PLAN:    In order of problems listed above:  1. Typical atrial flutter and atrial fibrillation On Eliquis and metoprolol.  Continues to have symptomatic paroxysms of atrial fibrillation and flutter on medical therapy.  Had an extensive conversation with the patient during clinic today about the options.  We discussed medical therapy with antiarrhythmic  medications (flecainide).  We also discussed ablation therapy for atrial fibrillation and flutter.  Discussed the risk and benefits of each option.  He would like to start with medical therapy and use the loop recorder to assess response.  We will initiate flecainide 100 mg twice daily and get a treadmill ECG test next week.  We will plan to see back in 6 weeks to assess for any side effects and the response to therapy.  We discussed the natural history of atrial fibrillation and flutter and a probable transition from paroxysmal to persistent.  From reviewing his rate histograms it looks like he is having near daily episodes of arrhythmias.    2.  Hypertension controlled on his current medications. Continue  lisinopril-hydrochlorothiazide and metoprolol.    Medication Adjustments/Labs and Tests Ordered: Current medicines are reviewed at length with the patient today.  Concerns regarding medicines are outlined above.  Orders Placed This Encounter  Procedures  . Exercise Tolerance Test  . EKG 12-Lead   Meds ordered this encounter  Medications  . flecainide (TAMBOCOR) 100 MG tablet    Sig: Take 1 tablet (100 mg total) by mouth 2 (two) times daily.    Dispense:  180 tablet    Refill:  3     Signed, Lars Mage, MD, [facility]  12/24/2019 10:30 AM    Electrophysiology [facility]

## 2019-12-25 LAB — CUP PACEART REMOTE DEVICE CHECK
Date Time Interrogation Session: 20211026155057
Implantable Pulse Generator Implant Date: 20210923

## 2019-12-26 ENCOUNTER — Other Ambulatory Visit: Payer: 59 | Admitting: *Deleted

## 2019-12-26 ENCOUNTER — Other Ambulatory Visit: Payer: Self-pay

## 2019-12-26 DIAGNOSIS — I483 Typical atrial flutter: Secondary | ICD-10-CM

## 2019-12-26 DIAGNOSIS — I480 Paroxysmal atrial fibrillation: Secondary | ICD-10-CM

## 2019-12-26 LAB — CBC WITH DIFFERENTIAL/PLATELET
Basophils Absolute: 0 10*3/uL (ref 0.0–0.2)
Basos: 0 %
EOS (ABSOLUTE): 0 10*3/uL (ref 0.0–0.4)
Eos: 0 %
Hematocrit: 46.9 % (ref 37.5–51.0)
Hemoglobin: 16.3 g/dL (ref 13.0–17.7)
Immature Grans (Abs): 0 10*3/uL (ref 0.0–0.1)
Immature Granulocytes: 0 %
Lymphocytes Absolute: 2.2 10*3/uL (ref 0.7–3.1)
Lymphs: 28 %
MCH: 31.4 pg (ref 26.6–33.0)
MCHC: 34.8 g/dL (ref 31.5–35.7)
MCV: 90 fL (ref 79–97)
Monocytes Absolute: 0.8 10*3/uL (ref 0.1–0.9)
Monocytes: 11 %
Neutrophils Absolute: 4.6 10*3/uL (ref 1.4–7.0)
Neutrophils: 61 %
Platelets: 227 10*3/uL (ref 150–450)
RBC: 5.19 x10E6/uL (ref 4.14–5.80)
RDW: 12.6 % (ref 11.6–15.4)
WBC: 7.7 10*3/uL (ref 3.4–10.8)

## 2019-12-26 LAB — BASIC METABOLIC PANEL
BUN/Creatinine Ratio: 16 (ref 10–24)
BUN: 19 mg/dL (ref 8–27)
CO2: 26 mmol/L (ref 20–29)
Calcium: 9.6 mg/dL (ref 8.6–10.2)
Chloride: 99 mmol/L (ref 96–106)
Creatinine, Ser: 1.16 mg/dL (ref 0.76–1.27)
GFR calc Af Amer: 77 mL/min/{1.73_m2} (ref >59)
GFR calc non Af Amer: 67 mL/min/{1.73_m2} (ref >59)
Glucose: 87 mg/dL (ref 65–99)
Potassium: 4.2 mmol/L (ref 3.5–5.2)
Sodium: 139 mmol/L (ref 134–144)

## 2019-12-27 ENCOUNTER — Telehealth (HOSPITAL_COMMUNITY): Payer: Self-pay | Admitting: Emergency Medicine

## 2019-12-27 ENCOUNTER — Other Ambulatory Visit (HOSPITAL_COMMUNITY): Payer: 59

## 2019-12-27 NOTE — Telephone Encounter (Signed)
Attempted to call patient regarding upcoming cardiac CT appointment. °Left message on voicemail with name and callback number °Lurena Naeve RN Navigator Cardiac Imaging °[facility] °[phone] Office °[phone] Cell ° °

## 2019-12-30 ENCOUNTER — Other Ambulatory Visit: Payer: Self-pay

## 2019-12-30 ENCOUNTER — Ambulatory Visit (HOSPITAL_COMMUNITY)
Admission: RE | Admit: 2019-12-30 | Discharge: 2019-12-30 | Disposition: A | Discharge status: Home / Self Care | Payer: 59 | Source: Ambulatory Visit | Attending: Cardiology | Admitting: Cardiology

## 2019-12-30 DIAGNOSIS — I4891 Unspecified atrial fibrillation: Secondary | ICD-10-CM

## 2019-12-30 MED ORDER — IOHEXOL 350 MG/ML SOLN
80.0000 mL | Freq: Once | INTRAVENOUS | Status: AC | PRN
  Administered 2019-12-30: 80 mL via INTRAVENOUS

## 2019-12-30 MED ORDER — METOPROLOL TARTRATE 5 MG/5ML IV SOLN
INTRAVENOUS | Status: AC
  Filled 2019-12-30: qty 5

## 2019-12-30 NOTE — Progress Notes (Signed)
Carelink Summary Report / Loop Recorder 

## 2019-12-31 NOTE — Telephone Encounter (Signed)
Work up for afib ablation complete.

## 2020-01-01 ENCOUNTER — Other Ambulatory Visit (HOSPITAL_COMMUNITY)
Admission: RE | Admit: 2020-01-01 | Discharge: 2020-01-01 | Disposition: A | Discharge status: Home / Self Care | Payer: 59 | Source: Ambulatory Visit | Attending: Cardiology | Admitting: Cardiology

## 2020-01-01 ENCOUNTER — Other Ambulatory Visit: Payer: 59

## 2020-01-01 DIAGNOSIS — Z20822 Contact with and (suspected) exposure to covid-19: Secondary | ICD-10-CM | POA: Insufficient documentation

## 2020-01-01 DIAGNOSIS — Z01812 Encounter for preprocedural laboratory examination: Secondary | ICD-10-CM | POA: Diagnosis present

## 2020-01-01 LAB — SARS CORONAVIRUS 2 (TAT 6-24 HRS): SARS Coronavirus 2: NEGATIVE

## 2020-01-02 NOTE — Progress Notes (Signed)
Instructed patient on the following items: Arrival time 0530 Nothing to eat or drink after midnight No meds AM of procedure Responsible person to drive you home and stay with you for 24 hrs  Have you missed any doses of anti-coagulant Eliquis- hasn't missed any doses    

## 2020-01-03 ENCOUNTER — Ambulatory Visit (HOSPITAL_COMMUNITY): Payer: 59 | Admitting: Anesthesiology

## 2020-01-03 ENCOUNTER — Ambulatory Visit (HOSPITAL_COMMUNITY): Admit: 2020-01-03 | Payer: 59 | Admitting: Cardiology

## 2020-01-03 ENCOUNTER — Other Ambulatory Visit: Payer: Self-pay

## 2020-01-03 ENCOUNTER — Encounter (HOSPITAL_COMMUNITY): Payer: Self-pay | Admitting: Cardiology

## 2020-01-03 ENCOUNTER — Ambulatory Visit (HOSPITAL_COMMUNITY)
Admission: RE | Admit: 2020-01-03 | Discharge: 2020-01-03 | Disposition: A | Discharge status: Home / Self Care | Payer: 59 | Attending: Cardiology | Admitting: Cardiology

## 2020-01-03 ENCOUNTER — Encounter (HOSPITAL_COMMUNITY): Payer: 59

## 2020-01-03 ENCOUNTER — Encounter (HOSPITAL_COMMUNITY)
Admission: RE | Disposition: A | Discharge status: Home / Self Care | Payer: 59 | Source: Home / Self Care | Attending: Cardiology

## 2020-01-03 DIAGNOSIS — Z87891 Personal history of nicotine dependence: Secondary | ICD-10-CM | POA: Insufficient documentation

## 2020-01-03 DIAGNOSIS — Z7901 Long term (current) use of anticoagulants: Secondary | ICD-10-CM | POA: Insufficient documentation

## 2020-01-03 DIAGNOSIS — E782 Mixed hyperlipidemia: Secondary | ICD-10-CM | POA: Insufficient documentation

## 2020-01-03 DIAGNOSIS — I10 Essential (primary) hypertension: Secondary | ICD-10-CM | POA: Diagnosis not present

## 2020-01-03 DIAGNOSIS — I480 Paroxysmal atrial fibrillation: Secondary | ICD-10-CM | POA: Insufficient documentation

## 2020-01-03 DIAGNOSIS — I483 Typical atrial flutter: Secondary | ICD-10-CM

## 2020-01-03 HISTORY — PX: ATRIAL FIBRILLATION ABLATION: EP1191

## 2020-01-03 LAB — POCT ACTIVATED CLOTTING TIME
Activated Clotting Time: 246 seconds
Activated Clotting Time: 279 seconds

## 2020-01-03 SURGERY — ATRIAL FIBRILLATION ABLATION
Anesthesia: General

## 2020-01-03 MED ORDER — EPHEDRINE SULFATE-NACL 50-0.9 MG/10ML-% IV SOSY
PREFILLED_SYRINGE | INTRAVENOUS | Status: DC | PRN
  Administered 2020-01-03 (×3): 10 mg via INTRAVENOUS

## 2020-01-03 MED ORDER — LIDOCAINE 20MG/ML (2%) 15 ML SYRINGE OPTIME
INTRAMUSCULAR | Status: DC | PRN
  Administered 2020-01-03: 20 mg via INTRAVENOUS

## 2020-01-03 MED ORDER — MIDAZOLAM HCL 5 MG/5ML IJ SOLN
INTRAMUSCULAR | Status: DC | PRN
  Administered 2020-01-03: 2 mg via INTRAVENOUS

## 2020-01-03 MED ORDER — PROPOFOL 10 MG/ML IV BOLUS
INTRAVENOUS | Status: DC | PRN
  Administered 2020-01-03: 150 mg via INTRAVENOUS

## 2020-01-03 MED ORDER — ONDANSETRON HCL 4 MG/2ML IJ SOLN: 4.0000 mg | Freq: Four times a day (QID) | INTRAMUSCULAR | Status: DC | PRN

## 2020-01-03 MED ORDER — HEPARIN SODIUM (PORCINE) 1000 UNIT/ML IJ SOLN
INTRAMUSCULAR | Status: AC
  Filled 2020-01-03: qty 1

## 2020-01-03 MED ORDER — ROCURONIUM BROMIDE 10 MG/ML (PF) SYRINGE
PREFILLED_SYRINGE | INTRAVENOUS | Status: DC | PRN
  Administered 2020-01-03: 50 mg via INTRAVENOUS

## 2020-01-03 MED ORDER — PHENYLEPHRINE 40 MCG/ML (10ML) SYRINGE FOR IV PUSH (FOR BLOOD PRESSURE SUPPORT)
PREFILLED_SYRINGE | INTRAVENOUS | Status: DC | PRN
  Administered 2020-01-03 (×3): 80 ug via INTRAVENOUS
  Administered 2020-01-03: 40 ug via INTRAVENOUS
  Administered 2020-01-03: 80 ug via INTRAVENOUS
  Administered 2020-01-03: 40 ug via INTRAVENOUS

## 2020-01-03 MED ORDER — ONDANSETRON HCL 4 MG/2ML IJ SOLN
INTRAMUSCULAR | Status: DC | PRN
  Administered 2020-01-03: 4 mg via INTRAVENOUS

## 2020-01-03 MED ORDER — FENTANYL CITRATE (PF) 100 MCG/2ML IJ SOLN
INTRAMUSCULAR | Status: DC | PRN
  Administered 2020-01-03 (×2): 50 ug via INTRAVENOUS

## 2020-01-03 MED ORDER — HEPARIN SODIUM (PORCINE) 1000 UNIT/ML IJ SOLN
INTRAMUSCULAR | Status: DC | PRN
  Administered 2020-01-03: 4000 [IU] via INTRAVENOUS
  Administered 2020-01-03: 12000 [IU] via INTRAVENOUS
  Administered 2020-01-03: 4000 [IU] via INTRAVENOUS

## 2020-01-03 MED ORDER — DEXAMETHASONE SODIUM PHOSPHATE 10 MG/ML IJ SOLN
INTRAMUSCULAR | Status: DC | PRN
  Administered 2020-01-03: 4 mg via INTRAVENOUS

## 2020-01-03 MED ORDER — HEPARIN (PORCINE) IN NACL 1000-0.9 UT/500ML-% IV SOLN
INTRAVENOUS | Status: DC | PRN
  Administered 2020-01-03 (×4): 500 mL

## 2020-01-03 MED ORDER — PROTAMINE SULFATE 10 MG/ML IV SOLN
INTRAVENOUS | Status: DC | PRN
  Administered 2020-01-03: 30 mg via INTRAVENOUS

## 2020-01-03 MED ORDER — APIXABAN 5 MG PO TABS
5.0000 mg | ORAL_TABLET | Freq: Two times a day (BID) | ORAL | Status: DC
  Administered 2020-01-03: 5 mg via ORAL
  Filled 2020-01-03: qty 1

## 2020-01-03 MED ORDER — PANTOPRAZOLE SODIUM 40 MG PO TBEC: DELAYED_RELEASE_TABLET | ORAL | 0 refills | Status: DC

## 2020-01-03 MED ORDER — SODIUM CHLORIDE 0.9 % IV SOLN: INTRAVENOUS | Status: DC

## 2020-01-03 MED ORDER — PHENYLEPHRINE HCL-NACL 10-0.9 MG/250ML-% IV SOLN
INTRAVENOUS | Status: DC | PRN
  Administered 2020-01-03: 10 ug/min via INTRAVENOUS

## 2020-01-03 MED ORDER — HEPARIN SODIUM (PORCINE) 1000 UNIT/ML IJ SOLN
INTRAMUSCULAR | Status: DC | PRN
  Administered 2020-01-03: 1000 [IU] via INTRAVENOUS

## 2020-01-03 MED ORDER — SUGAMMADEX SODIUM 200 MG/2ML IV SOLN
INTRAVENOUS | Status: DC | PRN
  Administered 2020-01-03: 200 mg via INTRAVENOUS

## 2020-01-03 MED ORDER — PHENYLEPHRINE HCL-NACL 10-0.9 MG/250ML-% IV SOLN: INTRAVENOUS | Status: DC | PRN

## 2020-01-03 SURGICAL SUPPLY — 21 items
BLANKET WARM UNDERBOD FULL ACC (MISCELLANEOUS) ×2 IMPLANT
CATH 8FR REPROCESSED SOUNDSTAR (CATHETERS) ×2 IMPLANT
CATH 8FR SOUNDSTAR REPROCESSED (CATHETERS) IMPLANT
CATH MAPPNG PENTARAY F 2-6-2MM (CATHETERS) IMPLANT
CATH S CIRCA THERM PROBE 10F (CATHETERS) ×1 IMPLANT
CATH SMTCH THERMOCOOL SF DF (CATHETERS) ×1 IMPLANT
CATH WEBSTER BI DIR CS D-F CRV (CATHETERS) ×1 IMPLANT
CLOSURE PERCLOSE PROSTYLE (VASCULAR PRODUCTS) ×3 IMPLANT
COVER DOME SNAP 22 D (MISCELLANEOUS) ×1 IMPLANT
COVER SWIFTLINK CONNECTOR (BAG) ×2 IMPLANT
PACK EP LATEX FREE (CUSTOM PROCEDURE TRAY) ×2
PACK EP LF (CUSTOM PROCEDURE TRAY) ×1 IMPLANT
PAD PRO RADIOLUCENT 2001M-C (PAD) ×2 IMPLANT
PATCH CARTO3 (PAD) ×1 IMPLANT
PENTARAY F 2-6-2MM (CATHETERS) ×2
SHEATH BAYLIS TRANSSEPTAL 98CM (NEEDLE) ×1 IMPLANT
SHEATH CARTO VIZIGO SM CVD (SHEATH) ×1 IMPLANT
SHEATH PINNACLE 8F 10CM (SHEATH) ×2 IMPLANT
SHEATH PINNACLE 9F 10CM (SHEATH) ×1 IMPLANT
SHEATH PROBE COVER 6X72 (BAG) ×1 IMPLANT
TUBING SMART ABLATE COOLFLOW (TUBING) ×1 IMPLANT

## 2020-01-03 NOTE — Interval H&P Note (Signed)
History and Physical Interval Note:  01/03/2020 7:02 AM  Lonnie Palmer  has presented today for surgery, with the diagnosis of afib.  The various methods of treatment have been discussed with the patient and family. After consideration of risks, benefits and other options for treatment, the patient has consented to  Procedure(s): ATRIAL FIBRILLATION ABLATION (N/A) as a surgical intervention.  The patient's history has been reviewed, patient examined, no change in status, stable for surgery.  I have reviewed the patient's chart and labs.  Questions were answered to the patient's satisfaction.     Iyesha Such T Cherelle Midkiff

## 2020-01-03 NOTE — Progress Notes (Signed)
Patient and wife was given discharge instructions. Both verbalized instructions. 

## 2020-01-03 NOTE — Discharge Instructions (Signed)

## 2020-01-03 NOTE — Transfer of Care (Signed)
Immediate Anesthesia Transfer of Care Note  Patient: DEKLAN MINAR  Procedure(s) Performed: ATRIAL FIBRILLATION ABLATION (N/A )  Patient Location: PACU and Cath Lab  Anesthesia Type:General  Level of Consciousness: awake, alert , oriented and patient cooperative  Airway & Oxygen Therapy: Patient Spontanous Breathing and Patient connected to nasal cannula oxygen  Post-op Assessment: Report given to RN and Post -op Vital signs reviewed and stable  Post vital signs: Reviewed and stable  Last Vitals:  Vitals Value Taken Time  BP 122/65 01/03/20 1058  Temp    Pulse 80 01/03/20 1047  Resp 18 01/03/20 1058  SpO2 74 % 01/03/20 1047  Vitals shown include unvalidated device data.  Last Pain:  Vitals:   01/03/20 1052  TempSrc:   PainSc: 0-No pain         Complications: No complications documented.

## 2020-01-03 NOTE — Anesthesia Procedure Notes (Signed)
Procedure Name: Intubation Date/Time: 01/03/2020 7:46 AM Performed by: Lowella Dell, CRNA Pre-anesthesia Checklist: Patient identified, Emergency Drugs available, Suction available and Patient being monitored Patient Re-evaluated:Patient Re-evaluated prior to induction Oxygen Delivery Method: Circle System Utilized Preoxygenation: Pre-oxygenation with 100% oxygen Induction Type: IV induction Ventilation: Mask ventilation without difficulty Laryngoscope Size: Glidescope and 4 (*elective glidescope* d/t pt reported neck discomfort) Grade View: Grade I Tube type: Oral Tube size: 7.0 mm Number of attempts: 1 Airway Equipment and Method: Stylet,  Video-laryngoscopy and Rigid stylet Placement Confirmation: ETT inserted through vocal cords under direct vision,  positive ETCO2 and breath sounds checked- equal and bilateral Secured at: 22 cm Tube secured with: Tape Dental Injury: Teeth and Oropharynx as per pre-operative assessment

## 2020-01-03 NOTE — Anesthesia Preprocedure Evaluation (Signed)

## 2020-01-11 NOTE — Anesthesia Postprocedure Evaluation (Signed)
Anesthesia Post Note  Patient: Lonnie Palmer  Procedure(s) Performed: ATRIAL FIBRILLATION ABLATION (N/A )     Patient location during evaluation: Cath Lab Anesthesia Type: General Level of consciousness: awake and alert Pain management: pain level controlled Vital Signs Assessment: post-procedure vital signs reviewed and stable Respiratory status: spontaneous breathing, nonlabored ventilation, respiratory function stable and patient connected to nasal cannula oxygen Cardiovascular status: blood pressure returned to baseline and stable Postop Assessment: no apparent nausea or vomiting Anesthetic complications: no   No complications documented.  Last Vitals:  Vitals:   01/03/20 1235 01/03/20 1305  BP: (!) 141/76 139/81  Pulse: 86 83  Resp: 19 14  Temp:    SpO2: 100% 100%    Last Pain:  Vitals:   01/03/20 1052  TempSrc:   PainSc: 0-No pain                 Alfonsa Vaile COKER

## 2020-01-27 ENCOUNTER — Ambulatory Visit (INDEPENDENT_AMBULATORY_CARE_PROVIDER_SITE_OTHER): Payer: 59

## 2020-01-27 DIAGNOSIS — I480 Paroxysmal atrial fibrillation: Secondary | ICD-10-CM

## 2020-01-27 LAB — CUP PACEART REMOTE DEVICE CHECK
Date Time Interrogation Session: 20211128155219
Implantable Pulse Generator Implant Date: 20210923

## 2020-01-30 NOTE — Progress Notes (Signed)
Primary Care Physician: Donald Prose, MD Primary Cardiologist: Dr Audie Box Primary Electrophysiologist: Dr Quentin Ore Referring Physician: Dr Yehuda Mao is a 64 y.o. male with a history of typical atrial flutter, HTN, HLD, and paroxysmal atrial fibrillation who presents for follow up in the [facility]. The patient was initially diagnosed with atrial flutter on 11/15/19 and he was seen by Dr Quentin Ore to consider ablation. ILR was implanted prior to ablation and afib was also detected. Patient is on Eliquis for a CHADS2VASC score of 1. He underwent afib and atrial flutter ablation with Dr Quentin Ore on 01/03/20. Patient has not had any heart racing since the procedure. He denies CP, swallowing, or groin issues. ILR shows no episodes of afib since ablation.   Today, he denies symptoms of palpitations, chest pain, shortness of breath, orthopnea, PND, lower extremity edema, dizziness, presyncope, syncope, snoring, daytime somnolence, bleeding, or neurologic sequela. The patient is tolerating medications without difficulties and is otherwise without complaint today.    Atrial Fibrillation Risk Factors:  he does not have symptoms or diagnosis of sleep apnea. he does not have a history of rheumatic fever.   he has a BMI of Body mass index is 23.64 kg/m.Marland Kitchen Filed Weights   01/31/20 0914  Weight: 81.3 kg    Family History  Problem Relation Age of Onset  . Heart attack Maternal Grandfather      Atrial Fibrillation Management history:  Previous antiarrhythmic drugs: none Previous cardioversions: none Previous ablations: 01/03/20 fib and flutter CHADS2VASC score: 1 Anticoagulation history: Eliquis   Past Medical History:  Diagnosis Date  . Hyperlipidemia   . Hypertension    Past Surgical History:  Procedure Laterality Date  . ATRIAL FIBRILLATION ABLATION N/A 01/03/2020   Procedure: ATRIAL FIBRILLATION ABLATION;  Surgeon: Vickie Epley, MD;   Location: [place] [facility];  Service: Cardiovascular;  Laterality: N/A;  . LOOP RECORDER INSERTION N/A 11/21/2019   Procedure: LOOP RECORDER INSERTION;  Surgeon: Vickie Epley, MD;  Location: [place] [facility];  Service: Cardiovascular;  Laterality: N/A;  . LUMBAR DISC SURGERY      Current Outpatient Medications  Medication Sig Dispense Refill  . apixaban (ELIQUIS) 5 MG TABS tablet Take 1 tablet (5 mg total) by mouth 2 (two) times daily. 60 tablet 3  . aspirin EC 81 MG tablet Take 81 mg by mouth daily. Swallow whole.    . cyclobenzaprine (FLEXERIL) 10 MG tablet Take 10 mg by mouth at bedtime.     Marland Kitchen ezetimibe (ZETIA) 10 MG tablet Take 10 mg by mouth at bedtime.     Marland Kitchen HYDROcodone-acetaminophen (NORCO/VICODIN) 5-325 MG tablet Take 1 tablet by mouth every 4 (four) hours as needed for moderate pain.     Marland Kitchen ketoconazole (NIZORAL) 2 % cream Apply 1 application topically daily.    Marland Kitchen lisinopril-hydrochlorothiazide (ZESTORETIC) 10-12.5 MG tablet Take 1 tablet by mouth daily.    . metoprolol succinate (TOPROL XL) 50 MG 24 hr tablet Take 1 tablet (50 mg total) by mouth daily. 90 tablet 1  . Multiple Vitamins-Minerals (VITRUM 50+ SENIOR MULTI PO) Take 1 tablet by mouth every morning.     . pantoprazole (PROTONIX) 40 MG tablet Take one capsule by mouth daily x 45 days and then discontinue 45 tablet 0  . simvastatin (ZOCOR) 20 MG tablet Take 20 mg by mouth at bedtime.     Marland Kitchen co-enzyme Q-10 30 MG capsule Take 30 mg by mouth daily. (Patient not taking:  Reported on 01/31/2020)    . omega-3 acid ethyl esters (LOVAZA) 1 g capsule Take 1 g by mouth daily. (Patient not taking: Reported on 01/31/2020)     No current facility-administered medications for this encounter.    No Known Allergies  Social History   Socioeconomic History  . Marital status: Married    Spouse name: Not on file  . Number of children: 1  . Years of education: Not on file  . Highest education level: Not on file  Occupational  History  . Occupation: Contractor  Tobacco Use  . Smoking status: Former Smoker    Packs/day: 0.50    Years: 6.00    Pack years: 3.00  . Smokeless tobacco: Never Used  Substance and Sexual Activity  . Alcohol use: Never  . Drug use: Never  . Sexual activity: Not on file  Other Topics Concern  . Not on file  Social History Narrative  . Not on file   Social Determinants of Health   Financial Resource Strain:   . Difficulty of Paying Living Expenses: Not on file  Food Insecurity:   . Worried About Charity fundraiser in the Last Year: Not on file  . Ran Out of Food in the Last Year: Not on file  Transportation Needs:   . Lack of Transportation (Medical): Not on file  . Lack of Transportation (Non-Medical): Not on file  Physical Activity:   . Days of Exercise per Week: Not on file  . Minutes of Exercise per Session: Not on file  Stress:   . Feeling of Stress : Not on file  Social Connections:   . Frequency of Communication with Friends and Family: Not on file  . Frequency of Social Gatherings with Friends and Family: Not on file  . Attends Religious Services: Not on file  . Active Member of Clubs or Organizations: Not on file  . Attends Archivist Meetings: Not on file  . Marital Status: Not on file  Intimate Partner Violence:   . Fear of Current or Ex-Partner: Not on file  . Emotionally Abused: Not on file  . Physically Abused: Not on file  . Sexually Abused: Not on file     ROS- All systems are reviewed and negative except as per the HPI above.  Physical Exam: Vitals:   01/31/20 0914  BP: 128/78  Pulse: 95  Weight: 81.3 kg  Height: 6\' 1"  (1.854 m)    GEN- The patient is well appearing, alert and oriented x 3 today.   Head- normocephalic, atraumatic Eyes-  Sclera clear, conjunctiva pink Ears- hearing intact Oropharynx- clear Neck- supple  Lungs- Clear to ausculation bilaterally, normal work of breathing Heart- Regular rate and  rhythm, no murmurs, rubs or gallops  GI- soft, NT, ND, + BS Extremities- no clubbing, cyanosis, or edema MS- no significant deformity or atrophy Skin- no rash or lesion Psych- euthymic mood, full affect Neuro- strength and sensation are intact  Wt Readings from Last 3 Encounters:  01/31/20 81.3 kg  01/03/20 78 kg  12/24/19 82.7 kg    EKG today demonstrates SR HR 95, LAFB, PR 116, QRS 94, QTc 419  Echo 09/12/19 demonstrated  1. Left ventricular ejection fraction, by estimation, is 60 to 65%. The  left ventricle has normal function. The left ventricle has no regional  wall motion abnormalities. There is mild left ventricular hypertrophy.  Left ventricular diastolic parameters  are consistent with Grade I diastolic dysfunction (impaired relaxation).  2. Right ventricular systolic function is normal. The right ventricular  size is normal.  3. The mitral valve is grossly normal. Trivial mitral valve  regurgitation.  4. The aortic valve is tricuspid. Aortic valve regurgitation is not  visualized.  5. The inferior vena cava is normal in size with greater than 50%  respiratory variability, suggesting right atrial pressure of 3 mmHg.   Epic records are reviewed at length today  CHA2DS2-VASc Score = 1  The patient's score is based upon: CHF History: 0 HTN History: 1 Diabetes History: 0 Stroke History: 0 Vascular Disease History: 0 Age Score: 0 Gender Score: 0      ASSESSMENT AND PLAN: 1. Paroxysmal Atrial Fibrillation/atrial flutter The patient's CHA2DS2-VASc score is 1, indicating a 0.6% annual risk of stroke.   S/p afib and flutter ablation 01/03/20 Patient maintaining SR with 0% afib on ILR since ablation.  Continue Eliquis 5 mg BID with no missed doses for at least 3 months post ablation. Could consider stopping at that point but he will be 65 next year which would make his CV score 2. Defer to Dr Quentin Ore.  Continue Toprol 50 mg daily  2. HTN Stable, no changes  today.   Follow up with Dr Audie Box and Dr Quentin Ore as scheduled.    [facility] [address] [place], [place] [address] [phone] 01/31/2020 10:00 AM

## 2020-01-31 ENCOUNTER — Ambulatory Visit (HOSPITAL_COMMUNITY)
Admission: RE | Admit: 2020-01-31 | Discharge: 2020-01-31 | Disposition: A | Discharge status: Home / Self Care | Payer: 59 | Source: Ambulatory Visit | Attending: Physician Assistant | Admitting: Physician Assistant

## 2020-01-31 ENCOUNTER — Other Ambulatory Visit: Payer: Self-pay

## 2020-01-31 VITALS — BP 128/78 | HR 95 | Ht 73.0 in | Wt 179.2 lb

## 2020-01-31 DIAGNOSIS — Z8249 Family history of ischemic heart disease and other diseases of the circulatory system: Secondary | ICD-10-CM | POA: Diagnosis not present

## 2020-01-31 DIAGNOSIS — Z7901 Long term (current) use of anticoagulants: Secondary | ICD-10-CM | POA: Insufficient documentation

## 2020-01-31 DIAGNOSIS — Z87891 Personal history of nicotine dependence: Secondary | ICD-10-CM | POA: Diagnosis not present

## 2020-01-31 DIAGNOSIS — I10 Essential (primary) hypertension: Secondary | ICD-10-CM | POA: Insufficient documentation

## 2020-01-31 DIAGNOSIS — I4892 Unspecified atrial flutter: Secondary | ICD-10-CM | POA: Insufficient documentation

## 2020-01-31 DIAGNOSIS — Z79899 Other long term (current) drug therapy: Secondary | ICD-10-CM | POA: Diagnosis not present

## 2020-01-31 DIAGNOSIS — E785 Hyperlipidemia, unspecified: Secondary | ICD-10-CM | POA: Insufficient documentation

## 2020-01-31 DIAGNOSIS — I480 Paroxysmal atrial fibrillation: Secondary | ICD-10-CM | POA: Diagnosis present

## 2020-02-02 NOTE — Progress Notes (Signed)
Cardiology Office Note:   Date:  02/05/2020  NAME:  Lonnie Palmer    MRN: [id number] DOB:  December 20, 1955   PCP:  Donald Prose, MD  Cardiologist:  Evalina Field, MD  Electrophysiologist:  Vickie Epley, MD   Referring MD: Donald Prose, MD   Chief Complaint  Patient presents with  . Follow-up    History of Present Illness:   Lonnie Palmer is a 64 y.o. male with a hx of atrial flutter/fibrillation s/p ablation, HTN, HLD, CAD who presents for follow-up. Diagnosed with atrial flutter. Underwent TEE/DCCV/loop and found to have Afib. Underwent Ablation. Follow-up today. He reports he is doing well since her last visit.  No further recurrence of atrial fibrillation on his loop recorder.  Cholesterol level is acceptable.  We did go over the results of the CT scan which showed elevated coronary calcium score.  There was no evidence obstructive CAD.  He has no chest pain or shortness of breath.  He is doing well.  Blood pressures well controlled.  He is exercising 3 to 4 days/week.  Walking doing aerobic activity up to 30 to 45 minutes.  Denies any chest pain or shortness of breath.  Overall doing remarkably well with no further recurrence of A. fib.  No bleeding on Eliquis.  Problem List 1. HTN 2. HLD -Total cholesterol 136, HDL 56, LDL 54, triglycerides 155 3. PACs -23.3% PAC burden 4.  Atrial flutter/fibrillation  -11/15/2019. -CHADSVASC=2 (HTN, CAD) 5. CAD -CAC score 385 (82nd percentile)  Past Medical History: Past Medical History:  Diagnosis Date  . Coronary artery disease   . Hyperlipidemia   . Hypertension     Past Surgical History: Past Surgical History:  Procedure Laterality Date  . ATRIAL FIBRILLATION ABLATION N/A 01/03/2020   Procedure: ATRIAL FIBRILLATION ABLATION;  Surgeon: Vickie Epley, MD;  Location: [place] [facility];  Service: Cardiovascular;  Laterality: N/A;  . LOOP RECORDER INSERTION N/A 11/21/2019   Procedure: LOOP RECORDER INSERTION;  Surgeon:  Vickie Epley, MD;  Location: [place] [facility];  Service: Cardiovascular;  Laterality: N/A;  . LUMBAR DISC SURGERY      Current Medications: Current Meds  Medication Sig  . apixaban (ELIQUIS) 5 MG TABS tablet Take 1 tablet (5 mg total) by mouth 2 (two) times daily.  Marland Kitchen aspirin EC 81 MG tablet Take 81 mg by mouth daily. Swallow whole.  Marland Kitchen co-enzyme Q-10 30 MG capsule Take 30 mg by mouth daily.   . cyclobenzaprine (FLEXERIL) 10 MG tablet Take 10 mg by mouth at bedtime.   Marland Kitchen ezetimibe (ZETIA) 10 MG tablet Take 10 mg by mouth at bedtime.   Marland Kitchen HYDROcodone-acetaminophen (NORCO/VICODIN) 5-325 MG tablet Take 1 tablet by mouth every 4 (four) hours as needed for moderate pain.   Marland Kitchen ketoconazole (NIZORAL) 2 % cream Apply 1 application topically daily.  Marland Kitchen lisinopril-hydrochlorothiazide (ZESTORETIC) 10-12.5 MG tablet Take 1 tablet by mouth daily.  . metoprolol succinate (TOPROL XL) 50 MG 24 hr tablet Take 1 tablet (50 mg total) by mouth daily.  . Multiple Vitamins-Minerals (VITRUM 50+ SENIOR MULTI PO) Take 1 tablet by mouth every morning.   Marland Kitchen omega-3 acid ethyl esters (LOVAZA) 1 g capsule Take 1 g by mouth daily.   . pantoprazole (PROTONIX) 40 MG tablet Take one capsule by mouth daily x 45 days and then discontinue  . simvastatin (ZOCOR) 20 MG tablet Take 20 mg by mouth at bedtime.      Allergies:    Patient  has no known allergies.   Social History: Social History   Socioeconomic History  . Marital status: Married    Spouse name: Not on file  . Number of children: 1  . Years of education: Not on file  . Highest education level: Not on file  Occupational History  . Occupation: Contractor  Tobacco Use  . Smoking status: Former Smoker    Packs/day: 0.50    Years: 6.00    Pack years: 3.00  . Smokeless tobacco: Never Used  Substance and Sexual Activity  . Alcohol use: Never  . Drug use: Never  . Sexual activity: Not on file  Other Topics Concern  . Not on file  Social  History Narrative  . Not on file   Social Determinants of Health   Financial Resource Strain:   . Difficulty of Paying Living Expenses: Not on file  Food Insecurity:   . Worried About Charity fundraiser in the Last Year: Not on file  . Ran Out of Food in the Last Year: Not on file  Transportation Needs:   . Lack of Transportation (Medical): Not on file  . Lack of Transportation (Non-Medical): Not on file  Physical Activity:   . Days of Exercise per Week: Not on file  . Minutes of Exercise per Session: Not on file  Stress:   . Feeling of Stress : Not on file  Social Connections:   . Frequency of Communication with Friends and Family: Not on file  . Frequency of Social Gatherings with Friends and Family: Not on file  . Attends Religious Services: Not on file  . Active Member of Clubs or Organizations: Not on file  . Attends Archivist Meetings: Not on file  . Marital Status: Not on file     Family History: The patient's family history includes Heart attack in his maternal grandfather.  ROS:   All other ROS reviewed and negative. Pertinent positives noted in the HPI.     EKGs/Labs/Other Studies Reviewed:   The following studies were personally reviewed by me today:   CTA 12/30/2019 IMPRESSION: 1. There is normal pulmonary vein drainage into the left atrium with ostial measurements above.  2. There is no thrombus in the left atrial appendage.  3. The esophagus runs in the left atrial midline and is in close proximity to the left inferior pulmonary vein ostium.  4. There is a small PFO.  5. Normal coronary origin. Right dominance.  6. CAC score of 385, which is 82nd percentile for age- and sex-matched controls.  7. Minimal CAD (<25%) in the proximal LAD/RI/LCX.  Recent Labs: 12/26/2019: BUN 19; Creatinine, Ser 1.16; Hemoglobin 16.3; Platelets 227; Potassium 4.2; Sodium 139   Recent Lipid Panel No results found for: CHOL, TRIG, HDL, CHOLHDL, VLDL,  LDLCALC, LDLDIRECT  Physical Exam:   VS:  BP 126/84   Pulse 70   Ht 6\' 1"  (1.854 m)   Wt 181 lb 3.2 oz (82.2 kg)   BMI 23.91 kg/m    Wt Readings from Last 3 Encounters:  02/05/20 181 lb 3.2 oz (82.2 kg)  01/31/20 179 lb 3.2 oz (81.3 kg)  01/03/20 172 lb (78 kg)    General: Well nourished, well developed, in no acute distress Heart: Atraumatic, normal size  Eyes: PEERLA, EOMI  Neck: Supple, no JVD Endocrine: No thryomegaly Cardiac: Normal S1, S2; RRR; no murmurs, rubs, or gallops Lungs: Clear to auscultation bilaterally, no wheezing, rhonchi or rales  Abd: Soft,  nontender, no hepatomegaly  Ext: No edema, pulses 2+ Musculoskeletal: No deformities, BUE and BLE strength normal and equal Skin: Warm and dry, no rashes   Neuro: Alert and oriented to person, place, time, and situation, CNII-XII grossly intact, no focal deficits  Psych: Normal mood and affect   ASSESSMENT:   Lonnie Palmer is a 64 y.o. male who presents for the following: 1. Paroxysmal atrial fibrillation (HCC)   2. Typical atrial flutter ([place])   3. Coronary artery disease involving native coronary artery of native heart without angina pectoris   4. Mixed hyperlipidemia     PLAN:   1. Paroxysmal atrial fibrillation (HCC) 2. Typical atrial flutter (HCC) -Status post atrial flutter and fibrillation ablation.  No further recurrence.  On Eliquis 5 mg twice daily due to chads vas score of 2. -BP well controlled.  Weight is stable.  EF is normal.  3. Coronary artery disease involving native coronary artery of native heart without angina pectoris 4. Mixed hyperlipidemia -Coronary calcium score 385.  82nd percentile.  Most recent LDL cholesterol 55.  He will continue his simvastatin and Zetia.  Disposition: Return in about 1 year (around 02/04/2021).  Medication Adjustments/Labs and Tests Ordered: Current medicines are reviewed at length with the patient today.  Concerns regarding medicines are outlined above.  No  orders of the defined types were placed in this encounter.  No orders of the defined types were placed in this encounter.   Patient Instructions  Medication Instructions:  Continue same medications *If you need a refill on your cardiac medications before your next appointment, please call your pharmacy*   Lab Work: None ordered  Testing/Procedures: None ordered   Follow-Up: At [facility], you and your health needs are our priority.  As part of our continuing mission to provide you with exceptional heart care, we have created designated Provider Care Teams.  These Care Teams include your primary Cardiologist (physician) and Advanced Practice Providers (APPs -  Physician Assistants and Nurse Practitioners) who all work together to provide you with the care you need, when you need it.  We recommend signing up for the patient portal called "MyChart".  Sign up information is provided on this After Visit Summary.  MyChart is used to connect with patients for Virtual Visits (Telemedicine).  Patients are able to view lab/test results, encounter notes, upcoming appointments, etc.  Non-urgent messages can be sent to your provider as well.   To learn more about what you can do with MyChart, go to [email or web address].    Your next appointment: 1 year  Call in Sept to schedule Dec appointment     The format for your next appointment: Office    Provider: Dr.O'Neal        Time Spent with Patient: I have spent a total of 25 minutes with patient reviewing hospital notes, telemetry, EKGs, labs and examining the patient as well as establishing an assessment and plan that was discussed with the patient.  > 50% of time was spent in direct patient care.  Signed, Addison Naegeli. Audie Box, [place]  [address] [place], [place] [address] [phone]  02/05/2020 8:42 AM

## 2020-02-03 NOTE — Progress Notes (Signed)
Carelink Summary Report / Loop Recorder 

## 2020-02-05 ENCOUNTER — Encounter: Payer: 59 | Admitting: Cardiology

## 2020-02-05 ENCOUNTER — Encounter: Payer: Self-pay | Admitting: Cardiovascular Disease

## 2020-02-05 ENCOUNTER — Ambulatory Visit: Payer: 59 | Admitting: Cardiovascular Disease

## 2020-02-05 ENCOUNTER — Other Ambulatory Visit: Payer: Self-pay

## 2020-02-05 VITALS — BP 126/84 | HR 70 | Ht 73.0 in | Wt 181.2 lb

## 2020-02-05 DIAGNOSIS — I2510 Atherosclerotic heart disease of native coronary artery without angina pectoris: Secondary | ICD-10-CM

## 2020-02-05 DIAGNOSIS — E782 Mixed hyperlipidemia: Secondary | ICD-10-CM

## 2020-02-05 DIAGNOSIS — I483 Typical atrial flutter: Secondary | ICD-10-CM | POA: Diagnosis not present

## 2020-02-05 DIAGNOSIS — I480 Paroxysmal atrial fibrillation: Secondary | ICD-10-CM

## 2020-02-05 NOTE — Patient Instructions (Signed)
Medication Instructions:  Continue same medications *If you need a refill on your cardiac medications before your next appointment, please call your pharmacy*   Lab Work: None ordered  Testing/Procedures: None ordered   Follow-Up: At [facility], you and your health needs are our priority.  As part of our continuing mission to provide you with exceptional heart care, we have created designated Provider Care Teams.  These Care Teams include your primary Cardiologist (physician) and Advanced Practice Providers (APPs -  Physician Assistants and Nurse Practitioners) who all work together to provide you with the care you need, when you need it.  We recommend signing up for the patient portal called "MyChart".  Sign up information is provided on this After Visit Summary.  MyChart is used to connect with patients for Virtual Visits (Telemedicine).  Patients are able to view lab/test results, encounter notes, upcoming appointments, etc.  Non-urgent messages can be sent to your provider as well.   To learn more about what you can do with MyChart, go to [email or web address].    Your next appointment: 1 year  Call in Sept to schedule Dec appointment     The format for your next appointment: Office    Provider: Dr.O'Neal

## 2020-03-02 ENCOUNTER — Ambulatory Visit (INDEPENDENT_AMBULATORY_CARE_PROVIDER_SITE_OTHER): Payer: 59

## 2020-03-02 DIAGNOSIS — I480 Paroxysmal atrial fibrillation: Secondary | ICD-10-CM | POA: Diagnosis not present

## 2020-03-03 LAB — CUP PACEART REMOTE DEVICE CHECK
Date Time Interrogation Session: 20211231155026
Implantable Pulse Generator Implant Date: 20210923

## 2020-03-16 ENCOUNTER — Other Ambulatory Visit: Payer: Self-pay | Admitting: Cardiovascular Disease

## 2020-03-16 NOTE — Telephone Encounter (Signed)
Pt last saw Clint Fenton, PA on 01/31/20, last labs 12/26/19 Creat 1.16, age 65, weight 82.2kg, based on specified criteria pt is on appropriate dosage of Eliquis 5mg  BID.   Will refill rx.

## 2020-03-16 NOTE — Telephone Encounter (Signed)
This is Dr. O'Neal's pt 

## 2020-03-17 NOTE — Progress Notes (Signed)
Carelink Summary Report / Loop Recorder 

## 2020-03-20 ENCOUNTER — Other Ambulatory Visit: Payer: Self-pay | Admitting: Otolaryngology

## 2020-03-20 DIAGNOSIS — H918X9 Other specified hearing loss, unspecified ear: Secondary | ICD-10-CM

## 2020-03-23 ENCOUNTER — Other Ambulatory Visit (HOSPITAL_COMMUNITY): Payer: Self-pay | Admitting: Otolaryngology

## 2020-03-23 DIAGNOSIS — H918X9 Other specified hearing loss, unspecified ear: Secondary | ICD-10-CM

## 2020-03-30 ENCOUNTER — Inpatient Hospital Stay
Admission: RE | Admit: 2020-03-30 | Discharge: 2020-03-30 | Disposition: A | Discharge status: Home / Self Care | Payer: Self-pay | Source: Ambulatory Visit | Attending: Otolaryngology | Admitting: Otolaryngology

## 2020-03-30 ENCOUNTER — Ambulatory Visit (HOSPITAL_COMMUNITY)
Admission: RE | Admit: 2020-03-30 | Discharge: 2020-03-30 | Disposition: A | Discharge status: Home / Self Care | Payer: 59 | Source: Ambulatory Visit | Attending: Otolaryngology | Admitting: Otolaryngology

## 2020-03-30 ENCOUNTER — Ambulatory Visit
Admission: RE | Admit: 2020-03-30 | Discharge: 2020-03-30 | Disposition: A | Discharge status: Home / Self Care | Payer: Self-pay | Source: Ambulatory Visit | Attending: Otolaryngology | Admitting: Otolaryngology

## 2020-03-30 ENCOUNTER — Other Ambulatory Visit: Payer: Self-pay

## 2020-03-30 ENCOUNTER — Other Ambulatory Visit (HOSPITAL_COMMUNITY): Payer: Self-pay | Admitting: Otolaryngology

## 2020-03-30 DIAGNOSIS — R42 Dizziness and giddiness: Secondary | ICD-10-CM

## 2020-03-30 DIAGNOSIS — H918X9 Other specified hearing loss, unspecified ear: Secondary | ICD-10-CM | POA: Diagnosis not present

## 2020-03-30 DIAGNOSIS — C801 Malignant (primary) neoplasm, unspecified: Secondary | ICD-10-CM

## 2020-03-30 MED ORDER — GADOBUTROL 1 MMOL/ML IV SOLN
10.0000 mL | Freq: Once | INTRAVENOUS | Status: AC | PRN
  Administered 2020-03-30: 8 mL via INTRAVENOUS

## 2020-03-31 ENCOUNTER — Other Ambulatory Visit (HOSPITAL_COMMUNITY): Payer: Self-pay | Admitting: Otolaryngology

## 2020-03-31 DIAGNOSIS — R42 Dizziness and giddiness: Secondary | ICD-10-CM

## 2020-04-01 ENCOUNTER — Other Ambulatory Visit (HOSPITAL_COMMUNITY): Payer: Self-pay | Admitting: Otolaryngology

## 2020-04-02 LAB — CUP PACEART REMOTE DEVICE CHECK
Date Time Interrogation Session: 20220202154957
Implantable Pulse Generator Implant Date: 20210923

## 2020-04-06 ENCOUNTER — Ambulatory Visit (INDEPENDENT_AMBULATORY_CARE_PROVIDER_SITE_OTHER): Payer: 59

## 2020-04-06 DIAGNOSIS — I483 Typical atrial flutter: Secondary | ICD-10-CM

## 2020-04-07 ENCOUNTER — Ambulatory Visit: Payer: 59 | Admitting: Cardiology

## 2020-04-07 ENCOUNTER — Encounter: Payer: Self-pay | Admitting: Cardiology

## 2020-04-07 ENCOUNTER — Other Ambulatory Visit: Payer: Self-pay

## 2020-04-07 VITALS — BP 134/84 | HR 87 | Ht 73.0 in | Wt 186.2 lb

## 2020-04-07 DIAGNOSIS — I480 Paroxysmal atrial fibrillation: Secondary | ICD-10-CM

## 2020-04-07 DIAGNOSIS — I483 Typical atrial flutter: Secondary | ICD-10-CM

## 2020-04-07 NOTE — Patient Instructions (Signed)
Medication Instructions:  Your physician recommends that you continue on your current medications as directed. Please refer to the Current Medication list given to you today.  *If you need a refill on your cardiac medications before your next appointment, please call your pharmacy*   Lab Work: None ordered.  If you have labs (blood work) drawn today and your tests are completely normal, you will receive your results only by: MyChart Message (if you have MyChart) OR A paper copy in the mail If you have any lab test that is abnormal or we need to change your treatment, we will call you to review the results.   Testing/Procedures: None ordered.    Follow-Up: At [facility], you and your health needs are our priority.  As part of our continuing mission to provide you with exceptional heart care, we have created designated Provider Care Teams.  These Care Teams include your primary Cardiologist (physician) and Advanced Practice Providers (APPs -  Physician Assistants and Nurse Practitioners) who all work together to provide you with the care you need, when you need it.  We recommend signing up for the patient portal called "MyChart".  Sign up information is provided on this After Visit Summary.  MyChart is used to connect with patients for Virtual Visits (Telemedicine).  Patients are able to view lab/test results, encounter notes, upcoming appointments, etc.  Non-urgent messages can be sent to your provider as well.   To learn more about what you can do with MyChart, go to [email or web address].    Your next appointment:   12 month(s)  The format for your next appointment:   In Person  Provider:   Cameron Lambert, MD    

## 2020-04-07 NOTE — Progress Notes (Signed)
Electrophysiology Office Follow up Visit Note:    Date:  04/07/2020   ID:  Lonnie Palmer, DOB [birth year], MRN [id number]  PCP:  Donald Prose, [facility] Cardiologist:  Evalina Field, MD  [facility] Electrophysiologist:  Vickie Epley, MD    Interval History:    Lonnie Palmer is a 65 y.o. male who presents for a follow up visit after his atrial fibrillation ablation on January 03, 2020.  During that ablation I performed a PVI and ablation of the CTI.  He has had no recurrence of atrial fibrillation since his ablation. He is feeling well.  He is getting back to exercising routinely.  He is working on his diet as well.  Recently had a brain MRI to work-up asymmetric hearing loss and ringing in his ears.  MRI returned normal.    Past Medical History:  Diagnosis Date  . Coronary artery disease   . Hyperlipidemia   . Hypertension     Past Surgical History:  Procedure Laterality Date  . ATRIAL FIBRILLATION ABLATION N/A 01/03/2020   Procedure: ATRIAL FIBRILLATION ABLATION;  Surgeon: Vickie Epley, MD;  Location: [place] [facility];  Service: Cardiovascular;  Laterality: N/A;  . LOOP RECORDER INSERTION N/A 11/21/2019   Procedure: LOOP RECORDER INSERTION;  Surgeon: Vickie Epley, MD;  Location: [place] [facility];  Service: Cardiovascular;  Laterality: N/A;  . LUMBAR DISC SURGERY      Current Medications: Current Meds  Medication Sig  . aspirin EC 81 MG tablet Take 81 mg by mouth daily. Swallow whole.  Marland Kitchen co-enzyme Q-10 30 MG capsule Take 30 mg by mouth daily.   . cyclobenzaprine (FLEXERIL) 10 MG tablet Take 10 mg by mouth at bedtime.   Marland Kitchen ELIQUIS 5 MG TABS tablet TAKE 1 TABLET BY MOUTH TWICE DAILY  . ezetimibe (ZETIA) 10 MG tablet Take 10 mg by mouth at bedtime.   Marland Kitchen HYDROcodone-acetaminophen (NORCO/VICODIN) 5-325 MG tablet Take 1 tablet by mouth every 4 (four) hours as needed for moderate pain.   Marland Kitchen ketoconazole (NIZORAL) 2 % cream Apply 1 application  topically daily.  Marland Kitchen lisinopril-hydrochlorothiazide (ZESTORETIC) 10-12.5 MG tablet Take 1 tablet by mouth daily.  . metoprolol succinate (TOPROL-XL) 50 MG 24 hr tablet TAKE 1 TABLET BY MOUTH DAILY  . Multiple Vitamins-Minerals (VITRUM 50+ SENIOR MULTI PO) Take 1 tablet by mouth every morning.   Marland Kitchen omega-3 acid ethyl esters (LOVAZA) 1 g capsule Take 1 g by mouth daily.   . simvastatin (ZOCOR) 20 MG tablet Take 20 mg by mouth at bedtime.      Allergies:   Patient has no known allergies.   Social History   Socioeconomic History  . Marital status: Married    Spouse name: Not on file  . Number of children: 1  . Years of education: Not on file  . Highest education level: Not on file  Occupational History  . Occupation: Contractor  Tobacco Use  . Smoking status: Former Smoker    Packs/day: 0.50    Years: 6.00    Pack years: 3.00  . Smokeless tobacco: Never Used  Substance and Sexual Activity  . Alcohol use: Never  . Drug use: Never  . Sexual activity: Not on file  Other Topics Concern  . Not on file  Social History Narrative  . Not on file   Social Determinants of Health   Financial Resource Strain: Not on file  Food Insecurity: Not on file  Transportation Needs: Not on file  Physical Activity: Not on file  Stress: Not on file  Social Connections: Not on file     Family History: The patient's family history includes Heart attack in his maternal grandfather.  ROS:   Please see the history of present illness.    All other systems reviewed and are negative.  EKGs/Labs/Other Studies Reviewed:    The following studies were reviewed today:  Loop recorder interrogation reviewed from February 3 shows no recurrence of atrial fibrillation  EKG:  The ekg ordered today demonstrates sinus rhythm with a ventricular rate of 87 bpm.  Recent Labs: 12/26/2019: BUN 19; Creatinine, Ser 1.16; Hemoglobin 16.3; Platelets 227; Potassium 4.2; Sodium 139  Recent Lipid  Panel No results found for: CHOL, TRIG, HDL, CHOLHDL, VLDL, LDLCALC, LDLDIRECT  Physical Exam:    VS:  BP 134/84   Pulse 87   Ht 6\' 1"  (1.854 m)   Wt 186 lb 3.2 oz (84.5 kg)   SpO2 93%   BMI 24.57 kg/m     Wt Readings from Last 3 Encounters:  04/07/20 186 lb 3.2 oz (84.5 kg)  02/05/20 181 lb 3.2 oz (82.2 kg)  01/31/20 179 lb 3.2 oz (81.3 kg)     GEN:  Well nourished, well developed in no acute distress HEENT: Normal NECK: No JVD; No carotid bruits LYMPHATICS: No lymphadenopathy CARDIAC: RRR, no murmurs, rubs, gallops RESPIRATORY:  Clear to auscultation without rales, wheezing or rhonchi  ABDOMEN: Soft, non-tender, non-distended MUSCULOSKELETAL:  No edema; No deformity  SKIN: Warm and dry NEUROLOGIC:  Alert and oriented x 3 PSYCHIATRIC:  Normal affect   ASSESSMENT:    1. Paroxysmal atrial fibrillation (HCC)   2. Typical atrial flutter (HCC)    PLAN:    In order of problems listed above:  1. Paroxysmal atrial fibrillation and typical atrial flutter Doing well after his ablation in November.  Maintaining sinus rhythm.  Loop recorder shows no recurrence. Currently taking Eliquis 5 mg twice daily.  I would recommend he continue taking the Eliquis for now unless he develops any bleeding issues.  He is getting back to exercising and is doing well with this.  He is working on his diet as well.  I will plan on seeing him back in 12 months or sooner as needed.  Medication Adjustments/Labs and Tests Ordered: Current medicines are reviewed at length with the patient today.  Concerns regarding medicines are outlined above.  Orders Placed This Encounter  Procedures  . EKG 12-Lead   No orders of the defined types were placed in this encounter.    Signed, Lars Mage, MD, [facility]  04/07/2020 9:39 AM    Electrophysiology [place]

## 2020-04-13 NOTE — Progress Notes (Signed)
Carelink Summary Report / Loop Recorder 

## 2020-05-11 ENCOUNTER — Ambulatory Visit (INDEPENDENT_AMBULATORY_CARE_PROVIDER_SITE_OTHER): Payer: 59

## 2020-05-11 DIAGNOSIS — I480 Paroxysmal atrial fibrillation: Secondary | ICD-10-CM

## 2020-05-13 LAB — CUP PACEART REMOTE DEVICE CHECK
Date Time Interrogation Session: 20220307155212
Implantable Pulse Generator Implant Date: 20210923

## 2020-05-20 NOTE — Progress Notes (Signed)
Carelink Summary Report / Loop Recorder 

## 2020-06-15 ENCOUNTER — Ambulatory Visit (INDEPENDENT_AMBULATORY_CARE_PROVIDER_SITE_OTHER): Payer: 59

## 2020-06-15 DIAGNOSIS — I480 Paroxysmal atrial fibrillation: Secondary | ICD-10-CM | POA: Diagnosis not present

## 2020-06-16 LAB — CUP PACEART REMOTE DEVICE CHECK
Date Time Interrogation Session: 20220409155322
Implantable Pulse Generator Implant Date: 20210923

## 2020-07-02 NOTE — Progress Notes (Signed)
Carelink Summary Report / Loop Recorder 

## 2020-07-10 ENCOUNTER — Ambulatory Visit (INDEPENDENT_AMBULATORY_CARE_PROVIDER_SITE_OTHER): Payer: 59

## 2020-07-10 DIAGNOSIS — I480 Paroxysmal atrial fibrillation: Secondary | ICD-10-CM

## 2020-07-10 LAB — CUP PACEART REMOTE DEVICE CHECK
Date Time Interrogation Session: 20220512155412
Implantable Pulse Generator Implant Date: 20210923

## 2020-07-16 ENCOUNTER — Ambulatory Visit: Payer: 59 | Admitting: Urology

## 2020-07-16 ENCOUNTER — Telehealth: Payer: Self-pay

## 2020-07-16 ENCOUNTER — Other Ambulatory Visit: Payer: Self-pay

## 2020-07-16 ENCOUNTER — Encounter: Payer: Self-pay | Admitting: Urology

## 2020-07-16 VITALS — BP 138/88 | HR 84 | Temp 98.4°F | Ht 73.0 in | Wt 183.0 lb

## 2020-07-16 DIAGNOSIS — R351 Nocturia: Secondary | ICD-10-CM | POA: Diagnosis not present

## 2020-07-16 DIAGNOSIS — N403 Nodular prostate with lower urinary tract symptoms: Secondary | ICD-10-CM

## 2020-07-16 DIAGNOSIS — R972 Elevated prostate specific antigen [PSA]: Secondary | ICD-10-CM | POA: Diagnosis not present

## 2020-07-16 LAB — URINALYSIS, ROUTINE W REFLEX MICROSCOPIC
Bilirubin, UA: NEGATIVE
Glucose, UA: NEGATIVE
Ketones, UA: NEGATIVE
Leukocytes,UA: NEGATIVE
Nitrite, UA: NEGATIVE
Protein,UA: NEGATIVE
RBC, UA: NEGATIVE
Specific Gravity, UA: 1.01 (ref 1.005–1.030)
Urobilinogen, Ur: 0.2 mg/dL (ref 0.2–1.0)
pH, UA: 5.5 (ref 5.0–7.5)

## 2020-07-16 MED ORDER — LEVOFLOXACIN 750 MG PO TABS: ORAL_TABLET | ORAL | 0 refills | Status: DC

## 2020-07-16 MED ORDER — DIAZEPAM 10 MG PO TABS: ORAL_TABLET | ORAL | 0 refills | Status: DC

## 2020-07-16 NOTE — Progress Notes (Signed)
Urological Symptom Review  Patient is experiencing the following symptoms: Erection problems   Review of Systems  Gastrointestinal (upper)  : Negative for upper GI symptoms  Gastrointestinal (lower) : Negative for lower GI symptoms  Constitutional : Negative for symptoms  Skin: Negative for skin symptoms  Eyes: Negative for eye symptoms  Ear/Nose/Throat : Negative for Ear/Nose/Throat symptoms  Hematologic/Lymphatic: Negative for Hematologic/Lymphatic symptoms  Cardiovascular : Negative for cardiovascular symptoms  Respiratory : Negative for respiratory symptoms  Endocrine: Negative for endocrine symptoms  Musculoskeletal: Back pain  Neurological: Negative for neurological symptoms  Psychologic: Negative for psychiatric symptoms

## 2020-07-16 NOTE — Progress Notes (Signed)
Subjective: 1. Elevated PSA   2. Nodular prostate with lower urinary tract symptoms   3. Nocturia      Consult requested by Dr. Donald Prose  Lonnie Palmer is a 65 yo male who is sent by Dr. Nancy Fetter for a rising PSA and prostate nodule.  His PSA was 1.77 in 2019, 2.56 in 2020, 3.08 in 2021 and now 5.71.  He has minimal LUTS with nocturia x 1.  He has a history of post vasectomy epididymitis.  He has some ED and is functional but he gets benefit from 100mg  sildenafil.   He is on Eliquis for his history of Afib and had a successful ablation in 11/21.   ROS:   ROS  No Known Allergies  Past Medical History:  Diagnosis Date  . Coronary artery disease   . Hyperlipidemia   . Hypertension     Past Surgical History:  Procedure Laterality Date  . ATRIAL FIBRILLATION ABLATION N/A 01/03/2020   Procedure: ATRIAL FIBRILLATION ABLATION;  Surgeon: Vickie Epley, MD;  Location: [place] [facility];  Service: Cardiovascular;  Laterality: N/A;  . LOOP RECORDER INSERTION N/A 11/21/2019   Procedure: LOOP RECORDER INSERTION;  Surgeon: Vickie Epley, MD;  Location: [place] [facility];  Service: Cardiovascular;  Laterality: N/A;  . LUMBAR DISC SURGERY      Social History   Socioeconomic History  . Marital status: Married    Spouse name: Not on file  . Number of children: 1  . Years of education: Not on file  . Highest education level: Not on file  Occupational History  . Occupation: Contractor  Tobacco Use  . Smoking status: Former Smoker    Packs/day: 0.50    Years: 6.00    Pack years: 3.00  . Smokeless tobacco: Never Used  Substance and Sexual Activity  . Alcohol use: Never  . Drug use: Never  . Sexual activity: Not on file  Other Topics Concern  . Not on file  Social History Narrative  . Not on file   Social Determinants of Health   Financial Resource Strain: Not on file  Food Insecurity: Not on file  Transportation Needs: Not on file  Physical Activity: Not on file   Stress: Not on file  Social Connections: Not on file  Intimate Partner Violence: Not on file    Family History  Problem Relation Age of Onset  . Heart attack Maternal Grandfather     Anti-infectives: Anti-infectives (From admission, onward)   Start     Dose/Rate Route Frequency Ordered Stop   07/16/20 0000  levofloxacin (LEVAQUIN) 750 MG tablet           07/16/20 1214        Current Outpatient Medications  Medication Sig Dispense Refill  . aspirin EC 81 MG tablet Take 81 mg by mouth daily. Swallow whole.    Marland Kitchen co-enzyme Q-10 30 MG capsule Take 30 mg by mouth daily.     . cyclobenzaprine (FLEXERIL) 10 MG tablet Take 10 mg by mouth at bedtime.     . diazepam (VALIUM) 10 MG tablet 1-2 tabs po 1 hour prior to procedure. 2 tablet 0  . ELIQUIS 5 MG TABS tablet TAKE 1 TABLET BY MOUTH TWICE DAILY 60 tablet 3  . ezetimibe (ZETIA) 10 MG tablet Take 10 mg by mouth at bedtime.     Marland Kitchen HYDROcodone-acetaminophen (NORCO/VICODIN) 5-325 MG tablet Take 1 tablet by mouth every 4 (four) hours as needed for moderate pain.     Marland Kitchen  ketoconazole (NIZORAL) 2 % cream Apply 1 application topically daily.    Marland Kitchen levofloxacin (LEVAQUIN) 750 MG tablet Take 1 table PO 1hr prior to the biopsy. 1 tablet 0  . lisinopril-hydrochlorothiazide (ZESTORETIC) 10-12.5 MG tablet Take 1 tablet by mouth daily.    . metoprolol succinate (TOPROL-XL) 50 MG 24 hr tablet TAKE 1 TABLET BY MOUTH DAILY 90 tablet 3  . Multiple Vitamins-Minerals (VITRUM 50+ SENIOR MULTI PO) Take 1 tablet by mouth every morning.     Marland Kitchen omega-3 acid ethyl esters (LOVAZA) 1 g capsule Take 1 g by mouth daily.     . sildenafil (VIAGRA) 100 MG tablet Take 100 mg by mouth as needed.    . simvastatin (ZOCOR) 20 MG tablet Take 20 mg by mouth at bedtime.      No current facility-administered medications for this visit.     Objective: Vital signs in last 24 hours: BP 138/88   Pulse 84   Temp 98.4 F (36.9 C) (Oral)   Ht 6\' 1"  (1.854 m)   Wt 183 lb (83 kg)    BMI 24.14 kg/m   Intake/Output from previous day: No intake/output data recorded. Intake/Output this shift: @IOTHISSHIFT @   Physical Exam Vitals reviewed.  Constitutional:      Appearance: Normal appearance.  HENT:     Head: Normocephalic and atraumatic.  Abdominal:     General: Abdomen is flat.     Palpations: Abdomen is soft.     Hernia: No hernia is present.  Genitourinary:    Comments: Normal phallus, scrotum and epdidiymis. AP without lesions. NST without mass. Prostate 1.5+ with 1cm left apical nodule. SV non-palpable.  Musculoskeletal:        General: No swelling. Normal range of motion.     Cervical back: Normal range of motion and neck supple.  Skin:    General: Skin is warm and dry.  Neurological:     General: No focal deficit present.     Mental Status: He is alert and oriented to person, place, and time.     Lab Results:  I have reviewed his PSA results and urine.    Results for orders placed or performed in visit on 07/16/20 (from the past 24 hour(s))  Urinalysis, Routine w reflex microscopic     Status: None   Collection Time: 07/16/20  1:33 PM  Result Value Ref Range   Specific Gravity, UA 1.010 1.005 - 1.030   pH, UA 5.5 5.0 - 7.5   Color, UA Yellow Yellow   Appearance Ur Clear Clear   Leukocytes,UA Negative Negative   Protein,UA Negative Negative/Trace   Glucose, UA Negative Negative   Ketones, UA Negative Negative   RBC, UA Negative Negative   Bilirubin, UA Negative Negative   Urobilinogen, Ur 0.2 0.2 - 1.0 mg/dL   Nitrite, UA Negative Negative   Microscopic Examination Comment    Narrative   Performed at:  [facility] [address] [place], [place]  [id number] Lab Director: [place], Phone:  [phone]    Studies/Results: I have reviewed records from Dr. Nancy Fetter.    Assessment/Plan: Elevate PSA with prostate nodule.  He needs a biopsy.  I have reviewed the risks of bleeding, infection and difficulty voiding.    I sent Levaquin for the prep.    Meds ordered this encounter  Medications  . levofloxacin (LEVAQUIN) 750 MG tablet    Sig: Take 1 table PO 1hr prior to the biopsy.    Dispense:  1 tablet    Refill:  0  . diazepam (VALIUM) 10 MG tablet    Sig: 1-2 tabs po 1 hour prior to procedure.    Dispense:  2 tablet    Refill:  0     Orders Placed This Encounter  Procedures  . [place] PROSTATE BIOPSY MULTIPLE    Standing Status:   Future    Standing Expiration Date:   07/16/2021    Scheduling Instructions:     If this could be done in [place] early next week, please get it set up there, other wise I would like to get him done either at [place] or in [place] the week of 08/10/20    Order Specific Question:   Reason for Exam (SYMPTOM  OR DIAGNOSIS REQUIRED)    Answer:   elevated PSA and prostate nodule    Order Specific Question:   Preferred location?    Answer:   [facility]  . US Guided Needle Placement    Standing Status:   Future    Standing Expiration Date:   07/16/2021    Order Specific Question:   Reason for Exam (SYMPTOM  OR DIAGNOSIS REQUIRED)    Answer:   elevated psa    Order Specific Question:   Preferred imaging location?    Answer:   [facility]  . [place] Transrectal Complete    Standing Status:   Future    Standing Expiration Date:   07/16/2021    Order Specific Question:   Reason for Exam (SYMPTOM  OR DIAGNOSIS REQUIRED)    Answer:   elevated PSA    Order Specific Question:   Preferred imaging location?    Answer:   [facility]  . Urinalysis, Routine w reflex microscopic     Return for Needs prostate biopsy and then f/u with results. , Next available.    CC: Dr. Donald Prose.      Irine Seal 07/16/2020 [phone]

## 2020-07-16 NOTE — Telephone Encounter (Signed)
Left a Voice Message:  Pt spoke with his heart doctor - Dr. Audie Box regarding  Eliquis Will need a written cardiac clearance form faxed to [phone].  Thanks, Helene Kelp

## 2020-07-16 NOTE — Patient Instructions (Signed)
   Appointment Time: arrival 7:45 Appointment Date: May 26th, 2022  Location: [facility] Radiology Department   Prostate Biopsy Instructions  Stop all aspirin or blood thinners (aspirin, plavix, coumadin, warfarin, motrin, ibuprofen, advil, aleve, naproxen, naprosyn) for 7 days prior to the procedure.  If you have any questions about stopping these medications, please contact your primary care physician or cardiologist. You will need to hold your Eliquis 2 days prior.  A message has been sent to your cardiologist for approval.   Having a light meal prior to the procedure is recommended.  If you are diabetic or have low blood sugar please bring a small snack or glucose tablet.  A Fleets enema is needed to be purchased over the counter at a local pharmacy and used 2 hours before you scheduled appointment.  This can be purchased over the counter at any pharmacy.  Antibiotics will be administered in the clinic at the time of the procedure and 1 tablet has been sent to your pharmacy. Please take the antibiotic as prescribed.    Please bring someone with you to the procedure to drive you home if you are given a valium to take prior to your procedure.   If you have any questions or concerns, please feel free to call the office at [phone] or send a Mychart message.    Thank you, [facility]

## 2020-07-17 NOTE — Telephone Encounter (Signed)
Letter faxed to Dr. Audie Box office.

## 2020-07-20 ENCOUNTER — Telehealth: Payer: Self-pay | Admitting: *Deleted

## 2020-07-20 MED ORDER — ELIQUIS 5 MG PO TABS: 1.0000 | ORAL_TABLET | Freq: Two times a day (BID) | ORAL | 1 refills | Status: DC

## 2020-07-20 NOTE — Telephone Encounter (Signed)
Will route to PharmD for rec's re: holding anticoagulation. Richardson Dopp, PA-C    07/20/2020 10:00 AM

## 2020-07-20 NOTE — Telephone Encounter (Signed)
Notes faxed to surgeon. This phone note will be removed from the preop pool. Richardson Dopp, PA-C  07/20/2020 10:59 AM

## 2020-07-20 NOTE — Telephone Encounter (Signed)
   [facility] Pre-operative Risk Assessment    Patient Name: Lonnie Palmer  DOB: 09-01-1955  MRN: [id number]   HEARTCARE STAFF: - Please ensure there is not already an duplicate clearance open for this procedure. - Under Visit Info/Reason for Call, type in Other and utilize the format Clearance MM/DD/YY or Clearance TBD. Do not use dashes or single digits. - If request is for dental extraction, please clarify the # of teeth to be extracted.  Request for surgical clearance:  1. What type of surgery is being performed? Prostate biopsy   2. When is this surgery scheduled? 07/23/20  3. What type of clearance is required (medical clearance vs. Pharmacy clearance to hold med vs. Both)? both  4. Are there any medications that need to be held prior to surgery and how long?eliquis-2 days prior   5. Practice name and name of physician performing surgery? [facility] [place]   6. What is the office phone number? [phone]   7.   What is the office fax number? [phone]  8.   Anesthesia type (None, local, MAC, general) ? local   Fredia Beets 07/20/2020, 8:53 AM  _________________________________________________________________   (provider comments below)

## 2020-07-20 NOTE — Telephone Encounter (Signed)
Patient with diagnosis of atrial fibrillation on Eliquis for anticoagulation.    Procedure: prostate biopsy Date of procedure: 07/23/20   CHA2DS2-VASc Score = 2  This indicates a 2.2% annual risk of stroke. The patient's score is based upon: CHF History: No HTN History: Yes Diabetes History: No Stroke History: No Vascular Disease History: Yes Age Score: 0 Gender Score: 0   CrCl 75.8 Platelet count 227  Per office protocol, patient can hold Eliquis for 2 days prior to procedure.   Patient will not need bridging with Lovenox (enoxaparin) around procedure.  For orthopedic procedures please be sure to resume therapeutic (not prophylactic) dosing.

## 2020-07-20 NOTE — Telephone Encounter (Signed)
   Primary Cardiologist: Evalina Field, MD  Chart reviewed as part of pre-operative protocol coverage.   65 y.o. male with . Atrial flutter/fibrillation o S/p CTI and PVI ablation . Non-obs coronary artery disease on CT in 11/21 . Hypertension  . Hyperlipidemia   Last OV:  04/07/20 with Dr. Quentin Ore Procedure:  Prostate bx  Rx:  Hold Eliquis  RCRI:  Perioperative Risk of Major Cardiac Event is (%): 0.4 (low risk) DASI:  Functional Capacity in METs is: 5.05 (functional status is good )  Patient was contacted 07/20/2020 in reference to pre-operative risk assessment for pending surgery as outlined below.    Since last seen, Lonnie Palmer has done well without chest pain, syncope.  He did have an episode of dyspnea on exertion while playing golf this past weekend.  He works out every day and has not had any symptoms with his exercise.  Of note, the heat index was very high on the day he was playing golf.  His symptoms seem to be related to extreme temperature exposure.  Loop recorder eval did not demonstrate recurrent AFib.   Recommendations: . Based on ACC/AHA guidelines, the patient is at acceptable risk for the planned procedure and may proceed without further cardiovascular testing.  . Per office protocol, patient can hold Eliquis for 2 days prior to procedure.  Patient will not need bridging with Lovenox (enoxaparin) around procedure.  Eliquis should be resumed post op as soon as it is felt to be safe.    Please call with questions. Richardson Dopp, PA-C 07/20/2020, 10:56 AM

## 2020-07-21 ENCOUNTER — Encounter: Payer: Self-pay | Admitting: Urology

## 2020-07-23 ENCOUNTER — Ambulatory Visit (INDEPENDENT_AMBULATORY_CARE_PROVIDER_SITE_OTHER): Payer: 59 | Admitting: Urology

## 2020-07-23 ENCOUNTER — Ambulatory Visit (HOSPITAL_COMMUNITY)
Admission: RE | Admit: 2020-07-23 | Discharge: 2020-07-23 | Disposition: A | Discharge status: Home / Self Care | Payer: 59 | Source: Ambulatory Visit | Attending: Urology | Admitting: Urology

## 2020-07-23 ENCOUNTER — Encounter: Payer: Self-pay | Admitting: Urology

## 2020-07-23 ENCOUNTER — Other Ambulatory Visit: Payer: Self-pay | Admitting: Urology

## 2020-07-23 DIAGNOSIS — R972 Elevated prostate specific antigen [PSA]: Secondary | ICD-10-CM | POA: Insufficient documentation

## 2020-07-23 DIAGNOSIS — N403 Nodular prostate with lower urinary tract symptoms: Secondary | ICD-10-CM | POA: Insufficient documentation

## 2020-07-23 DIAGNOSIS — C61 Malignant neoplasm of prostate: Secondary | ICD-10-CM | POA: Insufficient documentation

## 2020-07-23 MED ORDER — LIDOCAINE HCL (PF) 2 % IJ SOLN
INTRAMUSCULAR | Status: AC
  Filled 2020-07-23: qty 10

## 2020-07-23 MED ORDER — CEFTRIAXONE SODIUM 1 G IJ SOLR: 1.0000 g | Freq: Once | INTRAMUSCULAR | Status: DC

## 2020-07-23 MED ORDER — STERILE WATER FOR INJECTION IJ SOLN
INTRAMUSCULAR | Status: AC
  Filled 2020-07-23: qty 10

## 2020-07-23 MED ORDER — CEFTRIAXONE SODIUM 1 G IJ SOLR
INTRAMUSCULAR | Status: AC
  Filled 2020-07-23: qty 10

## 2020-07-23 NOTE — Progress Notes (Signed)
Rocephin 1 gm given IM, per MD order. Pt tolerated well. Escorted back to waiting room with all personal belongings.

## 2020-07-23 NOTE — Progress Notes (Signed)
Prostate Biopsy Procedure   Informed consent was obtained after discussing risks/benefits of the procedure.  A time out was performed to ensure correct patient identity.  Pre-Procedure: - Last PSA Level: 5.71 - Prostate exam findings: 1.5cm left apical nodule  - Rocephin 1 gm IM given prophylactically - Levaquin 750 mg administered PO -Transrectal Ultrasound performed using the 10MHz probe.  - Seminal Vesicles: Normal - Prostate: symmetrical with well defined PZ and TZ - Prostate volume:  38 gm - Middle lobe: NA - Hypoechoic/Hyperechoic lesions: 1.4 x 0.9 x 1.5 cm hypoechoic lesion in the left mid to apical prostate consistent with the nodule on exam.  The lesion abutts and distorts the capsule suggesting the possibility of extracapsular involvement. - Prostate calculi: scant in right TZ.   Procedure: - Prostate block performed using 10 cc 1% lidocaine and needle biopsies taken from sextant areas, a total of 12 under ultrasound guidance.   No additional cores were obtained.   Post-Procedure: - Patient tolerated the procedure well - He was counseled to seek immediate medical attention if experiences any severe pain, significant bleeding, or fevers - He will be called with the biopsy results when available.

## 2020-07-23 NOTE — Progress Notes (Signed)
Carelink Summary Report / Loop Recorder 

## 2020-07-28 ENCOUNTER — Encounter: Payer: Self-pay | Admitting: Urology

## 2020-07-29 ENCOUNTER — Other Ambulatory Visit: Payer: Self-pay | Admitting: Urology

## 2020-07-29 DIAGNOSIS — R972 Elevated prostate specific antigen [PSA]: Secondary | ICD-10-CM

## 2020-07-29 DIAGNOSIS — C61 Malignant neoplasm of prostate: Secondary | ICD-10-CM

## 2020-07-29 DIAGNOSIS — N403 Nodular prostate with lower urinary tract symptoms: Secondary | ICD-10-CM

## 2020-07-29 NOTE — Progress Notes (Signed)
I have given him his results and have ordered  a CT AP and Bonescan along with referral to the [facility].   I messaged Dr. Alinda Money about the [facility] visit as well.   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  He has some right thigh radiculopathy so I ordered the CT stat so we can assess for nodes and lower spinal mets.

## 2020-07-30 ENCOUNTER — Other Ambulatory Visit: Payer: Self-pay

## 2020-07-30 ENCOUNTER — Ambulatory Visit (HOSPITAL_COMMUNITY)
Admission: RE | Admit: 2020-07-30 | Discharge: 2020-07-30 | Disposition: A | Discharge status: Home / Self Care | Payer: 59 | Source: Ambulatory Visit | Attending: Urology | Admitting: Urology

## 2020-07-30 ENCOUNTER — Other Ambulatory Visit (HOSPITAL_COMMUNITY): Payer: Self-pay

## 2020-07-30 ENCOUNTER — Encounter (HOSPITAL_COMMUNITY)
Admission: RE | Admit: 2020-07-30 | Discharge: 2020-07-30 | Disposition: A | Discharge status: Home / Self Care | Payer: 59 | Source: Ambulatory Visit | Attending: Urology | Admitting: Urology

## 2020-07-30 ENCOUNTER — Other Ambulatory Visit: Payer: Self-pay | Admitting: Urology

## 2020-07-30 DIAGNOSIS — C61 Malignant neoplasm of prostate: Secondary | ICD-10-CM

## 2020-07-30 DIAGNOSIS — K769 Liver disease, unspecified: Secondary | ICD-10-CM

## 2020-07-30 DIAGNOSIS — R932 Abnormal findings on diagnostic imaging of liver and biliary tract: Secondary | ICD-10-CM

## 2020-07-30 LAB — POCT I-STAT CREATININE: Creatinine, Ser: 1.2 mg/dL (ref 0.61–1.24)

## 2020-07-30 MED ORDER — TECHNETIUM TC 99M MEDRONATE IV KIT
20.0000 | PACK | Freq: Once | INTRAVENOUS | Status: AC | PRN
  Administered 2020-07-30: 21 via INTRAVENOUS

## 2020-07-30 MED ORDER — IOHEXOL 300 MG/ML  SOLN
100.0000 mL | Freq: Once | INTRAMUSCULAR | Status: AC | PRN
  Administered 2020-07-30: 100 mL via INTRAVENOUS

## 2020-07-31 ENCOUNTER — Encounter: Payer: Self-pay | Admitting: Urology

## 2020-07-31 NOTE — Addendum Note (Signed)
Addended by: Douglass Rivers D on: 07/31/2020 05:20 PM   Modules accepted: Level of Service

## 2020-07-31 NOTE — Progress Notes (Signed)
He has some changes consistent with arthritis on the bonescan but there was one area in the right lateral cervical spine that was indeterminate.  Image correlation was recommended but the type of study was not noted.  I will reach out to Dr. Thornton Papas and see what he recommends.

## 2020-08-03 ENCOUNTER — Telehealth: Payer: Self-pay

## 2020-08-03 DIAGNOSIS — C61 Malignant neoplasm of prostate: Secondary | ICD-10-CM

## 2020-08-03 NOTE — Telephone Encounter (Signed)
Order placed per Dr.Wrenn. will have scheduler attempt to coordinate with already scheduled MRI abd on June 13th.

## 2020-08-03 NOTE — Telephone Encounter (Signed)
-----   Message from Irine Seal, MD sent at 08/03/2020 11:39 AM EDT ----- Dr. Thornton Papas recommends a cervical MRI to assess the indeterminate lesion in the cervical spine.  Please order and C-spine MRI without contrast.

## 2020-08-10 ENCOUNTER — Ambulatory Visit (HOSPITAL_COMMUNITY)
Admission: RE | Admit: 2020-08-10 | Discharge: 2020-08-10 | Disposition: A | Discharge status: Home / Self Care | Payer: 59 | Source: Ambulatory Visit | Attending: Urology | Admitting: Urology

## 2020-08-10 ENCOUNTER — Encounter: Payer: Self-pay | Admitting: Urology

## 2020-08-10 ENCOUNTER — Other Ambulatory Visit: Payer: Self-pay

## 2020-08-10 DIAGNOSIS — C61 Malignant neoplasm of prostate: Secondary | ICD-10-CM | POA: Diagnosis present

## 2020-08-10 DIAGNOSIS — K769 Liver disease, unspecified: Secondary | ICD-10-CM | POA: Insufficient documentation

## 2020-08-10 MED ORDER — GADOBUTROL 1 MMOL/ML IV SOLN
8.5000 mL | Freq: Once | INTRAVENOUS | Status: AC | PRN
  Administered 2020-08-10: 8.5 mL via INTRAVENOUS

## 2020-08-11 ENCOUNTER — Encounter: Payer: Self-pay | Admitting: Urology

## 2020-08-11 ENCOUNTER — Ambulatory Visit: Payer: 59 | Admitting: Urology

## 2020-08-11 NOTE — Progress Notes (Signed)
Sent via mychart

## 2020-08-12 ENCOUNTER — Ambulatory Visit (INDEPENDENT_AMBULATORY_CARE_PROVIDER_SITE_OTHER): Payer: 59

## 2020-08-12 DIAGNOSIS — I480 Paroxysmal atrial fibrillation: Secondary | ICD-10-CM | POA: Diagnosis not present

## 2020-08-13 LAB — CUP PACEART REMOTE DEVICE CHECK
Date Time Interrogation Session: 20220614154938
Implantable Pulse Generator Implant Date: 20210923

## 2020-08-17 ENCOUNTER — Telehealth: Payer: Self-pay | Admitting: Cardiovascular Disease

## 2020-08-17 NOTE — Telephone Encounter (Signed)
Pharmacy, can you please comment on how long Eliquis can be held for upcoming procedure?  Thank you! 

## 2020-08-17 NOTE — Telephone Encounter (Signed)
Patient with diagnosis of A Fib on Eliquis for anticoagulation.    Procedure: Robot Assisted Laparoscopic Radical Prostatectomy With Bilateral Pelvic Lymphadenectomy  Date of procedure: TBD-ASAP    CHA2DS2-VASc Score = 2  This indicates a 2.2% annual risk of stroke. The patient's score is based upon: CHF History: No HTN History: Yes Diabetes History: No Stroke History: No Vascular Disease History: Yes Age Score: 0 Gender Score: 0     CrCl 73 mL/min Platelet count 227K  Per office protocol, patient can hold Eliquis for 3 days prior to procedure.    Patient will not need bridging with Lovenox (enoxaparin) around procedure.  If not bridging, patient should restart Eliquis on the evening of procedure or day after, at discretion of procedure MD

## 2020-08-17 NOTE — Telephone Encounter (Signed)
   [facility] Pre-operative Risk Assessment    Request for surgical clearance:  What type of surgery is being performed? Robot Assisted Laparoscopic Radical Prostatectomy With Bilateral Pelvic Lymphadenectomy  When is this surgery scheduled? TBD-ASAP   What type of clearance is required (medical clearance vs. Pharmacy clearance to hold med vs. Both)?  Both   Are there any medications that need to be held prior to surgery and how long? Eliquis, 3 days prior  Practice name and name of physician performing surgery?  [facility]  Dr. Raynelle Bring   What is your office phone number? [phone] (ext#: [phone])   7.   What is your office fax number?  [phone]  8.   Anesthesia type (None, local, MAC, general) ?  General    Lonnie Palmer 08/17/2020, 9:53 AM  _________________________________________________________________

## 2020-08-18 ENCOUNTER — Encounter: Payer: Self-pay | Admitting: Urology

## 2020-08-18 NOTE — Telephone Encounter (Signed)
    Lonnie Palmer DOB:  12-Feb-1956  MRN:  [id number]   Primary Cardiologist: Evalina Field, MD / Lars Mage, MD   Chart reviewed as part of pre-operative protocol coverage. Patient was last seen by Dr. Quentin Ore in 03/2020 at which time he was doing well from a cardiac standpoint. Patient was contacted today for further pre-op evaluation and reported doing well since last visit. He has had no recurrent problems with his atrial fibrillation. No chest pain, significant shortness of breath, orthopnea/PND, edema, significant palpitations, lightheadedness/dizziness, or syncope. He is easily able to complete >4.0 METS without any problems. Given past medical history and time since last visit, based on ACC/AHA guidelines, Lonnie Palmer would be at acceptable risk for the planned procedure without further cardiovascular testing.   Per Pharmacy and office protocol, patient can hold Eliquis for 3 days prior to procedure. Patient will NOT need bridging with Lovenox around procedure. If not bridging, patient should restart Eliquis on the evening of procedure or day after, at discretion of procedure MD.  I will route this recommendation to the requesting party via [facility] fax function and remove from pre-op pool.  Please call with questions.  Lonnie Mclean, PA-C 08/18/2020, 12:06 PM

## 2020-08-20 ENCOUNTER — Other Ambulatory Visit: Payer: Self-pay | Admitting: Urology

## 2020-08-26 NOTE — Progress Notes (Signed)
GU Location of Tumor / Histology:  Adenocarcinoma of the prostate  If Prostate Cancer, Gleason Score is (4 + 3), PSA (5.71), and Prostate volume (38 g)  Lonnie Palmer presented with signs/symptoms of: elevated PSA and left atypical nodule noted by Dr. Jeffie Pollock  Biopsies revealed:  07/23/2020   Past/Anticipated interventions by urology, if any:  09/10/2020 Dr. Raynelle Bring Nerve sparing robot assisted laparoscopic radical prostatectomy and bilateral pelvic lymphadenectomy   07/23/2020 Dr. Irine Seal Transrectal ultrasound of prostate with biopsies  Past/Anticipated interventions by medical oncology, if any:  No referral placed at this time  Weight changes, if any: Patient denies  IPSS Score: 1 (mild) SHIM Score: 25 (no ED)  Bowel/Bladder complaints, if any: Reports he had issues with diarrhea when he was trialing a new anti-anxiety medication, but issue has resolved since he's stopped taking. Denies any new bladder concerns, and states he would be delighted if he had to live with his current urinary condition for the rest of his life  Nausea/Vomiting, if any: Reports he had a short bout of nausea right after he was diagnosed, but states it has now since resolved.   Pain issues, if any:  Reports chronic back and neck pain, but states he take prescription pain medication to help manage and remain active  SAFETY ISSUES: Prior radiation? No Pacemaker/ICD? No, but patient does have a loop-recorder Possible current pregnancy? N/A Is the patient on methotrexate? No  Current Complaints / other details:  Nothing else of note

## 2020-08-26 NOTE — Progress Notes (Deleted)
nu

## 2020-09-01 NOTE — Patient Instructions (Addendum)
DUE TO COVID-19 ONLY ONE VISITOR IS ALLOWED TO COME WITH YOU AND STAY IN THE WAITING ROOM ONLY DURING PRE OP AND PROCEDURE.   **NO VISITORS ARE ALLOWED IN THE SHORT STAY AREA OR RECOVERY ROOM!!**  IF YOU WILL BE ADMITTED INTO THE HOSPITAL YOU ARE ALLOWED ONLY TWO SUPPORT PEOPLE DURING VISITATION HOURS ONLY (10AM -8PM)   The support person(s) may change daily. The support person(s) must pass our screening, gel in and out, and wear a mask at all times, including in the patient's room. Patients must also wear a mask when staff or their support person are in the room.  No visitors under the age of 17. Any visitor under the age of 25 must be accompanied by an adult.    COVID SWAB TESTING MUST BE COMPLETED ON:  09/08/20 @ 10:30 AM   [address] [place], [place] [address]   You are not required to quarantine, however you are required to wear a well-fitted mask when you are out and around people not in your household.  Hand Hygiene often Do NOT share personal items Notify your provider if you are in close contact with someone who has COVID or you develop fever 100.4 or greater, new onset of sneezing, cough, sore throat, shortness of breath or body aches.   Your procedure is scheduled on: 09/10/20   Report to [facility] Main  Entrance    Report to admitting at 10:30 AM   Call this number if you have problems the morning of surgery [phone]   Do not eat food :After Midnight.   May have liquids until 9:30 AM day of surgery  CLEAR LIQUID DIET  Foods Allowed                                                                     Foods Excluded  Water, Black Coffee and tea, regular and decaf               liquids that you cannot  Plain Jell-O in any flavor  (No red)                                     see through such as: Fruit ices (not with fruit pulp)                                             milk, soups, orange juice              Iced Popsicles (No red)                                                  All solid food                                   Apple juices Sports drinks like Gatorade (No  red) Lightly seasoned clear broth or consume(fat free) Sugar, honey syrup  Oral Hygiene is also important to reduce your risk of infection.                                    Remember - BRUSH YOUR TEETH THE MORNING OF SURGERY WITH YOUR REGULAR TOOTHPASTE   Take these medicines the morning of surgery with A SIP OF WATER: Norco, Metoprolol Succinate             You may not have any metal on your body including jewelry, and body piercing             Do not wear lotions, powders, cologne, or deodorant              Men may shave face and neck.   Do not bring valuables to the hospital. [place].   Contacts, dentures or bridgework may not be worn into surgery.   Bring small overnight bag day of surgery.    Special Instructions: Bring a copy of your healthcare power of attorney and living will documents         the day of surgery if you haven't scanned them in before.   Please read over the following fact sheets you were given: IF YOU HAVE QUESTIONS ABOUT YOUR PRE OP INSTRUCTIONS PLEASE CALL [phone]   [facility] - Preparing for Surgery Before surgery, you can play an important role.  Because skin is not sterile, your skin needs to be as free of germs as possible.  You can reduce the number of germs on your skin by washing with CHG (chlorahexidine gluconate) soap before surgery.  CHG is an antiseptic cleaner which kills germs and bonds with the skin to continue killing germs even after washing. Please DO NOT use if you have an allergy to CHG or antibacterial soaps.  If your skin becomes reddened/irritated stop using the CHG and inform your nurse when you arrive at Short Stay. Do not shave (including legs and underarms) for at least 48 hours prior to the first CHG shower.  You may shave your face/neck.  Please follow  these instructions carefully:  1.  Shower with CHG Soap the night before surgery and the  morning of surgery.  2.  If you choose to wash your hair, wash your hair first as usual with your normal  shampoo.  3.  After you shampoo, rinse your hair and body thoroughly to remove the shampoo.                             4.  Use CHG as you would any other liquid soap.  You can apply chg directly to the skin and wash.  Gently with a scrungie or clean washcloth.  5.  Apply the CHG Soap to your body ONLY FROM THE NECK DOWN.   Do   not use on face/ open                           Wound or open sores. Avoid contact with eyes, ears mouth and   genitals (private parts).  Wash face,  Genitals (private parts) with your normal soap.             6.  Wash thoroughly, paying special attention to the area where your    surgery  will be performed.  7.  Thoroughly rinse your body with warm water from the neck down.  8.  DO NOT shower/wash with your normal soap after using and rinsing off the CHG Soap.                9.  Pat yourself dry with a clean towel.            10.  Wear clean pajamas.            11.  Place clean sheets on your bed the night of your first shower and do not  sleep with pets. Day of Surgery : Do not apply any lotions/deodorants the morning of surgery.  Please wear clean clothes to the hospital/surgery center.  FAILURE TO FOLLOW THESE INSTRUCTIONS MAY RESULT IN THE CANCELLATION OF YOUR SURGERY  PATIENT SIGNATURE_________________________________  NURSE SIGNATURE__________________________________  ________________________________________________________________________  WHAT IS A BLOOD TRANSFUSION? Blood Transfusion Information  A transfusion is the replacement of blood or some of its parts. Blood is made up of multiple cells which provide different functions. Red blood cells carry oxygen and are used for blood loss replacement. White blood cells fight against  infection. Platelets control bleeding. Plasma helps clot blood. Other blood products are available for specialized needs, such as hemophilia or other clotting disorders. BEFORE THE TRANSFUSION  Who gives blood for transfusions?  Healthy volunteers who are fully evaluated to make sure their blood is safe. This is blood bank blood. Transfusion therapy is the safest it has ever been in the practice of medicine. Before blood is taken from a donor, a complete history is taken to make sure that person has no history of diseases nor engages in risky social behavior (examples are intravenous drug use or sexual activity with multiple partners). The donor's travel history is screened to minimize risk of transmitting infections, such as malaria. The donated blood is tested for signs of infectious diseases, such as HIV and hepatitis. The blood is then tested to be sure it is compatible with you in order to minimize the chance of a transfusion reaction. If you or a relative donates blood, this is often done in anticipation of surgery and is not appropriate for emergency situations. It takes many days to process the donated blood. RISKS AND COMPLICATIONS Although transfusion therapy is very safe and saves many lives, the main dangers of transfusion include:  Getting an infectious disease. Developing a transfusion reaction. This is an allergic reaction to something in the blood you were given. Every precaution is taken to prevent this. The decision to have a blood transfusion has been considered carefully by your caregiver before blood is given. Blood is not given unless the benefits outweigh the risks. AFTER THE TRANSFUSION Right after receiving a blood transfusion, you will usually feel much better and more energetic. This is especially true if your red blood cells have gotten low (anemic). The transfusion raises the level of the red blood cells which carry oxygen, and this usually causes an energy increase. The  nurse administering the transfusion will monitor you carefully for complications. HOME CARE INSTRUCTIONS  No special instructions are needed after a transfusion. You may find your energy is better. Speak with your caregiver about any limitations on activity for underlying diseases you  may have. SEEK MEDICAL CARE IF:  Your condition is not improving after your transfusion. You develop redness or irritation at the intravenous (IV) site. SEEK IMMEDIATE MEDICAL CARE IF:  Any of the following symptoms occur over the next 12 hours: Shaking chills. You have a temperature by mouth above 102 F (38.9 C), not controlled by medicine. Chest, back, or muscle pain. People around you feel you are not acting correctly or are confused. Shortness of breath or difficulty breathing. Dizziness and fainting. You get a rash or develop hives. You have a decrease in urine output. Your urine turns a dark color or changes to pink, red, or brown. Any of the following symptoms occur over the next 10 days: You have a temperature by mouth above 102 F (38.9 C), not controlled by medicine. Shortness of breath. Weakness after normal activity. The white part of the eye turns yellow (jaundice). You have a decrease in the amount of urine or are urinating less often. Your urine turns a dark color or changes to pink, red, or brown. Document Released: 02/12/2000 Document Revised: 05/09/2011 Document Reviewed: 10/01/2007 [facility] Patient Information 2014 [place], [place].  _______________________________________________________________________

## 2020-09-01 NOTE — Progress Notes (Addendum)
COVID Vaccine Completed: yes x4 Date COVID Vaccine completed: Has received booster: yes x2 COVID vaccine manufacturer: Pfizer     Date of COVID positive in last 90 days: N/A  PCP - Donald Prose, MD Cardiologist - Eleonore Chiquito, MD Electrophysiologist- Lars Mage, MD  Chest x-ray - 03/30/20 Epic EKG - 04/07/20 Epic Stress Test - years ago per pt ECHO - 09/12/19 Epic Cardiac Cath - 01/03/20 Epic Pacemaker/ICD device last checked: 08/11/20 Epic (loop recorder) Spinal Cord Stimulator: N/A  Cardiac Clearance in note by [place] dated 08/17/20  Sleep Study - N/A CPAP -   Fasting Blood Sugar - N/A Checks Blood Sugar _____ times a day  Blood Thinner Instructions: on Eliquis, hold 3 days prior to procedure per Eleonore Chiquito   Activity level: Can go up a flight of stairs and perform activities of daily living without stopping and without symptoms of chest pain or shortness of breath. Able to exercise without symptoms   Anesthesia review: HTN, A fib, PAC, loop recorder. Pt states that he needs extra care with positioning while under due to back/neck disc issues.  Patient denies shortness of breath, fever, cough and chest pain at PAT appointment   Patient verbalized understanding of instructions that were given to them at the PAT appointment. Patient was also instructed that they will need to review over the PAT instructions again at home before surgery.

## 2020-09-02 ENCOUNTER — Other Ambulatory Visit: Payer: Self-pay | Admitting: Urology

## 2020-09-02 ENCOUNTER — Encounter (HOSPITAL_COMMUNITY): Payer: Self-pay

## 2020-09-02 ENCOUNTER — Other Ambulatory Visit: Payer: Self-pay

## 2020-09-02 ENCOUNTER — Encounter (HOSPITAL_COMMUNITY)
Admission: RE | Admit: 2020-09-02 | Discharge: 2020-09-02 | Disposition: A | Discharge status: Home / Self Care | Payer: 59 | Source: Ambulatory Visit | Attending: Urology | Admitting: Urology

## 2020-09-02 DIAGNOSIS — Z01812 Encounter for preprocedural laboratory examination: Secondary | ICD-10-CM | POA: Insufficient documentation

## 2020-09-02 HISTORY — DX: Malignant (primary) neoplasm, unspecified: C80.1

## 2020-09-02 HISTORY — DX: Unspecified atrial fibrillation: I48.91

## 2020-09-02 LAB — BASIC METABOLIC PANEL
Anion gap: 6 (ref 5–15)
BUN: 15 mg/dL (ref 8–23)
CO2: 31 mmol/L (ref 22–32)
Calcium: 9.2 mg/dL (ref 8.9–10.3)
Chloride: 102 mmol/L (ref 98–111)
Creatinine, Ser: 0.98 mg/dL (ref 0.61–1.24)
GFR, Estimated: >60 mL/min (ref >60)
Glucose, Bld: 102 mg/dL — ABNORMAL HIGH (ref 70–99)
Potassium: 4.6 mmol/L (ref 3.5–5.1)
Sodium: 139 mmol/L (ref 135–145)

## 2020-09-02 LAB — CBC
HCT: 49.3 % (ref 39.0–52.0)
Hemoglobin: 16.9 g/dL (ref 13.0–17.0)
MCH: 30.8 pg (ref 26.0–34.0)
MCHC: 34.3 g/dL (ref 30.0–36.0)
MCV: 90 fL (ref 80.0–100.0)
Platelets: 222 10*3/uL (ref 150–400)
RBC: 5.48 MIL/uL (ref 4.22–5.81)
RDW: 11.5 % (ref 11.5–15.5)
WBC: 5.6 10*3/uL (ref 4.0–10.5)
nRBC: 0 % (ref 0.0–0.2)

## 2020-09-03 NOTE — Progress Notes (Signed)
Carelink Summary Report / Loop Recorder 

## 2020-09-04 NOTE — Progress Notes (Signed)
Anesthesia Chart Review:   Case: [id number] Date/Time: 09/10/20 1215   Procedures:      XI ROBOTIC ASSISTED LAPAROSCOPIC RADICAL PROSTATECTOMY LEVEL 2     LYMPHADENECTOMY, PELVIC (Bilateral)   Anesthesia type: General   Pre-op diagnosis: PROSTATE CANCER   Location: [place] 03 / [facility]   Surgeons: Raynelle Bring, MD       DISCUSSION: Pt is 65 years old with hx CAD (minimal by CT 12/30/19), atrial fibrillation (s/p ablation 01/03/20), loop recorder, HTN  Pt states that he needs extra care with positioning while under due to back/neck disc issues.  - Pt to stop eliquis 3 days before surgery   VS: BP 125/82   Pulse 70   Temp 36.7 C (Oral)   Resp 16   Ht 6\' 1"  (1.854 m)   Wt 79.3 kg   SpO2 100%   BMI 23.06 kg/m   PROVIDERS:  - PCP is Donald Prose, MD - Cardiologist is Eleonore Chiquito, MD. Last office visit 02/05/20. Cleared for surgery by Sande Rives, PA on 08/18/20 - EP cardiologist is Lars Mage, MD. Last office visit 04/07/20   LABS: Labs reviewed: Acceptable for surgery. (all labs ordered are listed, but only abnormal results are displayed)  Labs Reviewed  BASIC METABOLIC PANEL - Abnormal; Notable for the following components:      Result Value   Glucose, Bld 102 (*)    All other components within normal limits  CBC  TYPE AND SCREEN    EKG 04/07/20: sinus rhythm   CV: CT cardiac morphology 12/30/19:  1. There is normal pulmonary vein drainage into the left atrium with ostial measurements above. 2. There is no thrombus in the left atrial appendage. 3. The esophagus runs in the left atrial midline and is in close proximity to the left inferior pulmonary vein ostium. 4. There is a small PFO. 5. Normal coronary origin. Right dominance. 6. CAC score of 385, which is 82nd percentile for age- and sex-matched controls. 7. Minimal CAD (<25%) in the proximal LAD/RI/LCX.  Echo 09/12/19:  1. Left ventricular ejection fraction, by estimation, is 60 to 65%. The left  ventricle has normal function. The left ventricle has no regional wall motion abnormalities. There is mild left ventricular hypertrophy. Left ventricular diastolic parameters are consistent with Grade I diastolic dysfunction (impaired relaxation).   2. Right ventricular systolic function is normal. The right ventricular size is normal.   3. The mitral valve is grossly normal. Trivial mitral valve regurgitation.   4. The aortic valve is tricuspid. Aortic valve regurgitation is not visualized.   5. The inferior vena cava is normal in size with greater than 50% respiratory variability, suggesting right atrial pressure of 3 mmHg.    Past Medical History:  Diagnosis Date   Atrial fibrillation ([place])    Cancer [facility])    prostate   Coronary artery disease    Hyperlipidemia    Hypertension     Past Surgical History:  Procedure Laterality Date   APPENDECTOMY     ATRIAL FIBRILLATION ABLATION N/A 01/03/2020   Procedure: ATRIAL FIBRILLATION ABLATION;  Surgeon: Vickie Epley, MD;  Location: [facility];  Service: Cardiovascular;  Laterality: N/A;   HERNIA REPAIR     LOOP RECORDER INSERTION N/A 11/21/2019   Procedure: LOOP RECORDER INSERTION;  Surgeon: Vickie Epley, MD;  Location: [facility];  Service: Cardiovascular;  Laterality: N/A;   [place]  TOOTH EXTRACTION      MEDICATIONS:  apixaban (ELIQUIS) 5 MG TABS tablet   Coenzyme Q10 100 MG capsule   cyclobenzaprine (FLEXERIL) 10 MG tablet   ezetimibe (ZETIA) 10 MG tablet   HYDROcodone-acetaminophen (NORCO/VICODIN) 5-325 MG tablet   lisinopril-hydrochlorothiazide (ZESTORETIC) 10-12.5 MG tablet   metoprolol succinate (TOPROL-XL) 50 MG 24 hr tablet   Multiple Vitamins-Minerals (VITRUM 50+ SENIOR MULTI PO)   Omega-3 1000 MG CAPS   sildenafil (VIAGRA) 100 MG tablet   simvastatin (ZOCOR) 20 MG tablet   No current facility-administered medications for this encounter.   - Pt to stop  eliquis 3 days before surgery   If no changes, I anticipate pt can proceed with surgery as scheduled.   Willeen Cass, PhD, FNP-BC [facility] Short Stay Surgical Center/Anesthesiology Phone: [phone] 09/04/2020 12:08 PM

## 2020-09-04 NOTE — Anesthesia Preprocedure Evaluation (Addendum)
Anesthesia Evaluation    Reviewed: Allergy & Precautions, Patient's Chart, lab work & pertinent test results  Airway Mallampati: IV  TM Distance: >3 FB Neck ROM: Limited  Mouth opening: Limited Mouth Opening Comment: Limited mouth opening, limited neck extension and side to side rotation of neck  Dental  (+) Teeth Intact, Dental Advisory Given   Pulmonary former smoker,    Pulmonary exam normal breath sounds clear to auscultation       Cardiovascular hypertension, Pt. on medications + CAD  Normal cardiovascular exam+ dysrhythmias (off eliquis since 4d ago, ablation in november- no Afib since then on loop recorder L chest) Atrial Fibrillation  Rhythm:Regular Rate:Normal  CT cardiac morphology 12/30/19:  1. There is normal pulmonary vein drainage into the left atrium with ostial measurements above. 2. There is no thrombus in the left atrial appendage. 3. The esophagus runs in the left atrial midline and is in close proximity to the left inferior pulmonary vein ostium. 4. There is a small PFO. 5. Normal coronary origin. Right dominance. 6. CAC score of 385, which is 82nd percentile for age- and sex-matched controls. 7. Minimal CAD (<25%) in the proximal LAD/RI/LCX.  Echo 09/12/19:  1. Left ventricular ejection fraction, by estimation, is 60 to 65%. The left ventricle has normal function. The left ventricle has no regional wall motion abnormalities. There is mild left ventricular hypertrophy. Left ventricular diastolic parameters are consistent with Grade I diastolic dysfunction (impaired relaxation).  2. Right ventricular systolic function is normal. The right ventricular size is normal.  3. The mitral valve is grossly normal. Trivial mitral valve regurgitation.  4. The aortic valve is tricuspid. Aortic valve regurgitation is not visualized.  5. The inferior vena cava is normal in size with greater than 50% respiratory variability,  suggesting right atrial pressure of 3 mmHg.    Neuro/Psych negative neurological ROS  negative psych ROS   GI/Hepatic negative GI ROS, Neg liver ROS,   Endo/Other  negative endocrine ROS  Renal/GU negative Renal ROSLab Results      Component                Value               Date                      WBC                      5.6                 09/02/2020                HGB                      16.9                09/02/2020                HCT                      49.3                09/02/2020                MCV                      90.0  09/02/2020                PLT                      222                 09/02/2020              Prostate ca negative genitourinary   Musculoskeletal negative musculoskeletal ROS (+)   Abdominal Normal abdominal exam  (+)   Peds  Hematology negative hematology ROS (+) Lab Results      Component                Value               Date                      CREATININE               0.98                09/02/2020                BUN                      15                  09/02/2020                NA                       139                 09/02/2020                K                        4.6                 09/02/2020                CL                       102                 09/02/2020                CO2                      31                  09/02/2020              Anesthesia Other Findings   Reproductive/Obstetrics negative OB ROS                           Anesthesia Physical Anesthesia Plan  ASA: 2  Anesthesia Plan: General   Post-op Pain Management:    Induction: Intravenous  PONV Risk Score and Plan: 3 and Midazolam, Dexamethasone, Ondansetron and Treatment may vary due to age or medical condition  Airway Management Planned: Oral ETT  Additional Equipment: None  Intra-op Plan:   Post-operative Plan: Extubation in OR  Informed Consent: I have reviewed the patients History  and Physical, chart, labs and discussed the procedure including the  risks, benefits and alternatives for the proposed anesthesia with the patient or authorized representative who has indicated his/her understanding and acceptance.     Dental advisory given  Plan Discussed with: CRNA  Anesthesia Plan Comments:       Anesthesia Quick Evaluation

## 2020-09-08 ENCOUNTER — Other Ambulatory Visit (HOSPITAL_COMMUNITY)
Admission: RE | Admit: 2020-09-08 | Discharge: 2020-09-08 | Disposition: A | Discharge status: Home / Self Care | Payer: 59 | Source: Ambulatory Visit | Attending: Urology | Admitting: Urology

## 2020-09-08 ENCOUNTER — Ambulatory Visit
Admission: RE | Admit: 2020-09-08 | Discharge: 2020-09-08 | Disposition: A | Discharge status: Home / Self Care | Payer: 59 | Source: Ambulatory Visit | Attending: Radiation Oncology | Admitting: Radiation Oncology

## 2020-09-08 ENCOUNTER — Telehealth: Payer: Self-pay | Admitting: Radiation Oncology

## 2020-09-08 DIAGNOSIS — R9721 Rising PSA following treatment for malignant neoplasm of prostate: Secondary | ICD-10-CM | POA: Insufficient documentation

## 2020-09-08 DIAGNOSIS — Z20822 Contact with and (suspected) exposure to covid-19: Secondary | ICD-10-CM | POA: Insufficient documentation

## 2020-09-08 DIAGNOSIS — Z01812 Encounter for preprocedural laboratory examination: Secondary | ICD-10-CM | POA: Diagnosis not present

## 2020-09-08 DIAGNOSIS — C61 Malignant neoplasm of prostate: Secondary | ICD-10-CM | POA: Insufficient documentation

## 2020-09-08 LAB — SARS CORONAVIRUS 2 (TAT 6-24 HRS): SARS Coronavirus 2: NEGATIVE

## 2020-09-08 NOTE — Progress Notes (Signed)
Radiation Oncology         [phone] ________________________________  Initial Outpatient Consultation - Conducted via MyChart due to current [id number] concerns for limiting patient exposure  Name: Lonnie Palmer MRN: [id number]  Date: 09/08/2020  DOB: 10-25-1955  CC:Sun, Lonnie Crown, MD  Irine Seal, MD   REFERRING PHYSICIAN: Irine Seal, MD  DIAGNOSIS: 65 y.o. gentleman with Stage T2b/3a adenocarcinoma of the prostate with Gleason score of 4+3, and PSA of 5.71.  No diagnosis found.  HISTORY OF PRESENT ILLNESS: Lonnie Palmer is a 65 y.o. male with a diagnosis of prostate cancer. He was noted to have an elevated PSA of 5.71 by his primary care physician, Dr. Nancy Fetter.  Accordingly, he was referred for evaluation in urology by Dr. Jeffie Pollock on 07/16/20,  digital rectal examination was performed at that time revealing a 1 cm left apical nodule.  The patient proceeded to transrectal ultrasound with 12 biopsies of the prostate on 07/23/20.  The prostate volume measured 38 cc.  Out of 12 core biopsies, 4 were positive.  The maximum Gleason score was 4+3, and this was seen in left apex lateral (with perineural invasion) and left mid. Additionally, Gleason 3+4 was seen in left mid lateral (with PNI) and left apex.  He underwent staging CT and bone scan on 07/30/20. CT A/P showed: indeterminate 1.5 cm heterogeneous enhancing hepatic mass; no evidence of lymphadenopathy or bone metastases. Bone scan showed a nonspecific focus of uptake at the right lateral aspect of the inferior cervical spine.  He proceeded to MRI on 08/10/20 to further evaluate the areas seen previously. Cervical spine MRI showed: no specific evidence of metastatic disease on this noncontrast study; severe right facet arthropathy at C7-T1, which may explain the uptake seen on recent bone scan. Abdomen MRI showed two benign hemangiomas within the right hepatic lobe.  He is currently scheduled for RALP on Thursday, 09/10/20, under Dr.  Alinda Money.  The patient reviewed the biopsy results with his urologist and he has kindly been referred today for discussion of potential radiation treatment options.   PREVIOUS RADIATION THERAPY: No  PAST MEDICAL HISTORY:  Past Medical History:  Diagnosis Date   Atrial fibrillation ([place])    Cancer ([place])    prostate   Coronary artery disease    Hyperlipidemia    Hypertension       PAST SURGICAL HISTORY: Past Surgical History:  Procedure Laterality Date   APPENDECTOMY     ATRIAL FIBRILLATION ABLATION N/A 01/03/2020   Procedure: ATRIAL FIBRILLATION ABLATION;  Surgeon: Vickie Epley, MD;  Location: [place] [facility];  Service: Cardiovascular;  Laterality: N/A;   HERNIA REPAIR     LOOP RECORDER INSERTION N/A 11/21/2019   Procedure: LOOP RECORDER INSERTION;  Surgeon: Vickie Epley, MD;  Location: [place] [facility];  Service: Cardiovascular;  Laterality: N/A;   LUMBAR DISC SURGERY     VASECTOMY     WISDOM TOOTH EXTRACTION      FAMILY HISTORY:  Family History  Problem Relation Age of Onset   Heart attack Maternal Grandfather     SOCIAL HISTORY:  Social History   Socioeconomic History   Marital status: Married    Spouse name: Not on file   Number of children: 1   Years of education: Not on file   Highest education level: Not on file  Occupational History   Occupation: Contractor  Tobacco Use   Smoking status: Former    Packs/day: 0.50    Years:  6.00    Pack years: 3.00    Types: Cigarettes    Quit date: 29    Years since quitting: 35.5   Smokeless tobacco: Never  Vaping Use   Vaping Use: Never used  Substance and Sexual Activity   Alcohol use: Never   Drug use: Never   Sexual activity: Not on file  Other Topics Concern   Not on file  Social History Narrative   Not on file   Social Determinants of Health   Financial Resource Strain: Not on file  Food Insecurity: Not on file  Transportation Needs: Not on file  Physical Activity:  Not on file  Stress: Not on file  Social Connections: Not on file  Intimate Partner Violence: Not on file    ALLERGIES: Patient has no known allergies.  MEDICATIONS:  Current Outpatient Medications  Medication Sig Dispense Refill   apixaban (ELIQUIS) 5 MG TABS tablet Take 1 tablet (5 mg total) by mouth 2 (two) times daily. 180 tablet 1   Coenzyme Q10 100 MG capsule Take 100 mg by mouth daily.     cyclobenzaprine (FLEXERIL) 10 MG tablet Take 10 mg by mouth at bedtime.      ezetimibe (ZETIA) 10 MG tablet Take 10 mg by mouth at bedtime.      HYDROcodone-acetaminophen (NORCO/VICODIN) 5-325 MG tablet Take 1 tablet by mouth every 6 (six) hours as needed for moderate pain.     lisinopril-hydrochlorothiazide (ZESTORETIC) 10-12.5 MG tablet Take 1 tablet by mouth daily.     metoprolol succinate (TOPROL-XL) 50 MG 24 hr tablet TAKE 1 TABLET BY MOUTH DAILY (Patient taking differently: Take 50 mg by mouth daily.) 90 tablet 3   Multiple Vitamins-Minerals (VITRUM 50+ SENIOR MULTI PO) Take 1 tablet by mouth every morning.      Omega-3 1000 MG CAPS Take 2,000 mg by mouth daily.     sildenafil (VIAGRA) 100 MG tablet Take 100 mg by mouth as needed.     simvastatin (ZOCOR) 20 MG tablet Take 20 mg by mouth at bedtime.      No current facility-administered medications for this encounter.    REVIEW OF SYSTEMS:  On review of systems, the patient reports that he is doing well overall. He denies any chest pain, shortness of breath, cough, fevers, chills, night sweats, unintended weight changes. He denies any bowel disturbances, and denies abdominal pain, nausea or vomiting. He denies any new musculoskeletal or joint aches or pains. His IPSS was 1, indicating minimal urinary symptoms. His SHIM was 25, indicating he does not have erectile dysfunction. A complete review of systems is obtained and is otherwise negative.    PHYSICAL EXAM:  Wt Readings from Last 3 Encounters:  09/02/20 174 lb 12.8 oz (79.3 kg)   07/16/20 183 lb (83 kg)  04/07/20 186 lb 3.2 oz (84.5 kg)   Temp Readings from Last 3 Encounters:  09/02/20 98.1 F (36.7 C) (Oral)  07/16/20 98.4 F (36.9 C) (Oral)  01/03/20 (!) 97.3 F (36.3 C)   BP Readings from Last 3 Encounters:  09/02/20 125/82  07/16/20 138/88  04/07/20 134/84   Pulse Readings from Last 3 Encounters:  09/02/20 70  07/16/20 84  04/07/20 87    /10  In general this is a well appearing man in no acute distress. He's alert and oriented x4 and appropriate throughout the examination. Cardiopulmonary assessment is negative for acute distress and he exhibits normal effort.    KPS = 100  100 - Normal; no complaints;  no evidence of disease. 90   - Able to carry on normal activity; minor signs or symptoms of disease. 80   - Normal activity with effort; some signs or symptoms of disease. 18   - Cares for self; unable to carry on normal activity or to do active work. 60   - Requires occasional assistance, but is able to care for most of his personal needs. 50   - Requires considerable assistance and frequent medical care. 4   - Disabled; requires special care and assistance. 15   - Severely disabled; hospital admission is indicated although death not imminent. 60   - Very sick; hospital admission necessary; active supportive treatment necessary. 10   - Moribund; fatal processes progressing rapidly. 0     - Dead  Karnofsky DA, Abelmann Ribera, Craver LS and Burchenal [facility] [phone]) The use of the nitrogen mustards in the palliative treatment of carcinoma: with particular reference to bronchogenic carcinoma Cancer 1 [id number]  LABORATORY DATA:  Lab Results  Component Value Date   WBC 5.6 09/02/2020   HGB 16.9 09/02/2020   HCT 49.3 09/02/2020   MCV 90.0 09/02/2020   PLT 222 09/02/2020   Lab Results  Component Value Date   NA 139 09/02/2020   K 4.6 09/02/2020   CL 102 09/02/2020   CO2 31 09/02/2020   No results found for: ALT, AST, GGT, ALKPHOS, BILITOT    RADIOGRAPHY: MR Cervical Spine Wo Contrast  Result Date: 08/11/2020 CLINICAL DATA:  Prostate cancer. Potential lesion seen on nuclear medicine bone scan. EXAM: MRI CERVICAL SPINE WITHOUT CONTRAST TECHNIQUE: Multiplanar, multisequence MR imaging of the cervical spine was performed. No intravenous contrast was administered. COMPARISON:  None. FINDINGS: Alignment: Approximately 2-3 mm of retrolisthesis of C3 on C4, C5 on C6 and C6 on C7. Vertebrae: No focal marrow edema to suggest acute fracture or discitis/osteomyelitis. No suspicious bone lesions. Sclerosis surrounding the right C6-C7 facet joint, compatible with severe facet arthropathy. Cord: Normal cord signal. Posterior Fossa, vertebral arteries, paraspinal tissues: Visualized vertebral artery flow voids are maintained. Unremarkable visualized posterior fossa limited sagittal sesamoid. Disc levels: C2-C3: Mild bilateral facet arthropathy without significant canal or foraminal stenosis. C3-C4: Posterior disc osteophyte complex and left greater than right facet and uncovertebral hypertrophy with moderate left and mild right foraminal stenosis. Moderate canal stenosis. C4-C5: Small posterior disc osteophyte complex and mild bilateral facet hypertrophy without significant canal or foraminal stenosis. C5-C6: Left eccentric posterior disc osteophyte complex which contacts and flattens the ventral cord with mild canal stenosis. Moderate left and mild right foraminal stenosis. C6-C7: Posterior disc osteophyte complex and bilateral uncovertebral hypertrophy. Moderate to severe bilateral foraminal stenosis. Moderate canal stenosis. C7-T1: No significant disc protrusion, foraminal stenosis, or canal stenosis. IMPRESSION: 1. No specific evidence of metastatic disease on this noncontrast study. Severe right facet arthropathy at C7-T1, which may explain the uptake seen on the recent bone scan. 2. Moderate to severe bilateral foraminal stenosis at C6-C7, moderate foraminal  stenosis on the left at C3-C4 and C5-C6, and multilevel mild foraminal stenosis. 3. Moderate canal stenosis at C3-C4 and C6-C7 and mild canal stenosis at C5-C6. 4. Approximately 2-3 mm of retrolisthesis of C3 on C4, C5 on C6 and C6 on C7. Electronically Signed   By: Margaretha Sheffield MD   On: 08/11/2020 07:55   MR Abdomen W Wo Contrast  Result Date: 08/11/2020 CLINICAL DATA:  Follow-up liver lesion.  History of prostate cancer. EXAM: MRI ABDOMEN WITHOUT AND WITH CONTRAST TECHNIQUE: Multiplanar multisequence MR  imaging of the abdomen was performed both before and after the administration of intravenous contrast. CONTRAST:  8.4mL GADAVIST GADOBUTROL 1 MMOL/ML IV SOLN COMPARISON:  CT AP 07/30/2020 FINDINGS: Lower chest: No acute findings. Hepatobiliary: Within the right hepatic lobe there is a T2 hyperintense and T1 hypointense lesion measuring 1.6 cm, image 12/5. This demonstrates early peripheral nodular enhancement with delayed fill-in compatible with a benign hemangioma. There is a smaller lesion within the posterior right hepatic lobe measuring 7 mm, image 12/5. This also exhibits early peripheral enhancement with delayed fill-in compatible with a benign hemangioma. No additional liver lesions identified. Normal appearance of the gallbladder. No biliary dilatation. Pancreas: No mass, inflammatory changes, or other parenchymal abnormality identified. Spleen:  Within normal limits in size and appearance. Adrenals/Urinary Tract: Normal adrenal glands. No kidney mass or hydronephrosis. Stomach/Bowel: Visualized portions within the abdomen are unremarkable. Vascular/Lymphatic: No pathologically enlarged lymph nodes identified. No abdominal aortic aneurysm demonstrated. Other:  None. Musculoskeletal: No suspicious bone lesions identified. IMPRESSION: Two benign hemangiomas are identified within the right hepatic lobe. No suspicious liver lesions identified. Electronically Signed   By: Kerby Moors M.D.   On:  08/11/2020 10:37   CUP PACEART REMOTE DEVICE CHECK  Result Date: 08/13/2020 ILR summary report received. Battery status OK. Normal device function. No new symptom, tachy, brady, or pause episodes. No new AF episodes. Monthly summary reports and ROV/PRN Kathy Breach, RN, CCDS, [employer]     IMPRESSION/PLAN: This visit was conducted via MyChart to spare the patient unnecessary potential exposure in the healthcare setting during the current COVID-19 pandemic. 1. 65 y.o. gentleman with Stage T2b/3a adenocarcinoma of the prostate with Gleason Score of 4+3, and PSA of 5.71. We discussed the patient's workup and outlined the nature of prostate cancer in this setting. The patient's T stage, Gleason's score, and PSA put him into the intermediate risk group. Accordingly, he is eligible for a variety of potential treatment options including brachytherapy, 5.5 weeks of external radiation, or prostatectomy. We discussed the available radiation techniques, and focused on the details and logistics and delivery. We discussed and outlined the risks, benefits, short and long-term effects associated with radiotherapy and compared and contrasted these with prostatectomy. He had questions regarding preservation of sexual function, which I answered to the best of my ability.  The patient focused most of his questions and interest in robotic-assisted laparoscopic radical prostatectomy.  We discussed some of the potential advantages of surgery including surgical staging, the availability of salvage radiotherapy to the prostatic fossa, and the confidence associated with immediate biochemical response. We discussed some of the potential proven indications for postoperative radiotherapy including positive margins, extracapsular extension, and seminal vesicle involvement. We also talked about some of the other potential findings leading to a recommendation for radiotherapy including a non-zero postoperative PSA and  positive lymph nodes.   At the end of the conversation the patient is still leaning towards moving forward with prostatectomy. This is scheduled for Thursday, 09/10/20, with Dr. Alinda Money. We enjoyed meeting with this nice gentleman, and we look forward to following along in his care.   Given current concerns for patient exposure during the COVID-19 pandemic, this encounter was conducted via video-enabled MyChart visit. The patient has given verbal consent for this type of encounter. The time spent during this encounter was 60 minutes. The attendants for this meeting include Tyler Pita MD and patient Lonnie Palmer and his wife During the encounter, Tyler Pita MD was located at [facility]  [place] [facility].  Patient Lonnie Palmer his wife was located at home.   I personally spent 60 minutes in this encounter including chart review, reviewing radiological studies, meeting face-to-face with the patient, entering orders and completing documentation.    ------------------------------------------------   Tyler Pita, MD [place]: [phone]  Fax: [phone] [email or web address]  Skype  [email or web address]   This document serves as a record of services personally performed by Tyler Pita, MD. It was created on his behalf by Wilburn Mylar, a trained medical scribe. The creation of this record is based on the scribe's personal observations and the provider's statements to them. This document has been checked and approved by the attending provider.

## 2020-09-09 ENCOUNTER — Encounter (HOSPITAL_COMMUNITY): Payer: Self-pay | Admitting: Urology

## 2020-09-09 ENCOUNTER — Ambulatory Visit: Payer: 59 | Admitting: Radiation Oncology

## 2020-09-09 NOTE — H&P (Signed)
Office Visit Report     08/11/2020   --------------------------------------------------------------------------------   Lonnie Palmer  MRN: [id number]  DOB: [birth year], 65 year old Male  SSN:    PRIMARY CARE:    REFERRING:  Irine Seal, MD  PROVIDER:  Raynelle Bring, M.D.  LOCATION:  [facility] [phone]     --------------------------------------------------------------------------------   CC/HPI: CC: Prostate Cancer   Physician requesting consult: Dr. Irine Seal  PCP: Dr. Donald Prose   Lonnie Palmer is a 65 year old gentleman who was found to have an elevated PSA of 5.71 and a 1.5 cm left apical nodule by Dr. Jeffie Pollock with concern for possible extraprostatic extension. He underwent a TRUS biopsy of the prostate on 07/23/20 that confirmed Gleason 4+3=7 adenocarcinoma of the prostate with 4 out of 12 biopsy cores positive for malignancy.   Family history: None.   Imaging studies:  Bone scan (07/30/20): Scattered degenerative changes with a non-specific focus of the right lateral inferior cervical spine.  CT scan abdomen/pelvis (07/30/20): 1.5 cm enhancing hepatic mass, no evidence of lymphadenopathy  MRI abdomen (08/10/20): His MRI confirmed 2 benign hemangiomas in the right hepatic lobe. No suspicious lesions were noted.   PMH: He has a history of atrial fibrillation (on Eliquis and s/p ablation), CAD, hyperlipidemia, and hypertension. He has chronic pain and takes narcotics prn for this.  PSH: No abdominal surgeries.   TNM stage: cT3a N0 Mx (left apical 1.5 cm nodule with probable EPE on the left)  PSA: 5.71  Gleason score: 4+3=7 (GG 2)  Biopsy (07/23/20): 4/12 cores positive  Left: L lateral apex (20%, 4+3=7, PNI), L apex (70%, 3+4=7), L lateral mid (80%, 3+4=7, PNI), L mid (70%, 4+3=7)  Right: Benign  Prostate volume: 38.0 cc   Nomogram  OC disease: 45%  EPE: 51%  SVI: 9%  LNI: 12%  PFS (5 year, 10 year): 59%, 43%   Urinary function: IPSS is 1.   Erectile function: SHIM score is 23.     ALLERGIES: None   MEDICATIONS: Lisinopril  Metoprolol Succinate 50 mg tablet, extended release 24 hr  Viagra 100 mg tablet  Co Q10  Eliquis  Zetia  Zocor 20 mg tablet     GU PSH: Vasectomy       PSH Notes: laminectomy 1984/1988     NON-GU PSH: Hernia Repair     GU PMH: None   NON-GU PMH: Atrial Fibrillation Hypercholesterolemia Hypertension    FAMILY HISTORY: 1 son - Son father deceased - Father mother deceased - Mother   SOCIAL HISTORY: Marital Status: Married Preferred Language: English; Ethnicity: Not Hispanic Or Latino Current Smoking Status: Patient does not smoke anymore.   Tobacco Use Assessment Completed: Used Tobacco in last 30 days? Has never drank.     REVIEW OF SYSTEMS:    GU Review Male:   Patient reports get up at night to urinate and trouble starting your streams. Patient denies frequent urination, hard to postpone urination, burning/ pain with urination, leakage of urine, stream starts and stops, and have to strain to urinate .  Gastrointestinal (Lower):   Patient denies diarrhea and constipation.  Gastrointestinal (Upper):   Patient denies nausea and vomiting.  Constitutional:   Patient denies fever, night sweats, weight loss, and fatigue.  Skin:   Patient denies skin rash/ lesion and itching.  Eyes:   Patient denies blurred vision and double vision.  Ears/ Nose/ Throat:   Patient denies sore throat and sinus problems.  Hematologic/Lymphatic:  Patient denies swollen glands and easy bruising.  Cardiovascular:   Patient denies leg swelling and chest pains.  Respiratory:   Patient denies cough and shortness of breath.  Endocrine:   Patient denies excessive thirst.  Musculoskeletal:   Patient reports back pain and joint pain.   Neurological:   Patient denies headaches and dizziness.  Psychologic:   Patient reports anxiety. Patient denies depression.   VITAL SIGNS:      08/11/2020 04:14 PM  Weight  175 lb / 79.38 kg  Height 73 in / 185.42 cm  BP 127/75 mmHg  Pulse 83 /min  Temperature 97.3 F / 36.2 C  BMI 23.1 kg/m   GU PHYSICAL EXAMINATION:    Prostate: Prostate about 40 grams. He has a large 1.5-2 cm nodule on the left side of the prostate that extends from the apex back to the base. Although there is not definite extra prostatic extension, his exam is highly concerning for this probability.   MULTI-SYSTEM PHYSICAL EXAMINATION:    Constitutional: Well-nourished. No physical deformities. Normally developed. Good grooming.  Respiratory: No labored breathing, no use of accessory muscles. clear bilaterally.  Cardiovascular: Normal temperature, normal extremity pulses, no swelling, no varicosities. Regular rate and rhythm.  Gastrointestinal: No mass, no tenderness, no rigidity, non obese abdomen. Well-healed right lower quadrant incision scar.     Complexity of Data:  Lab Test Review:   PSA  Records Review:   Pathology Reports, Previous Patient Records  X-Ray Review: C.T. Abdomen/Pelvis: Reviewed Films.  MRI Abdomen: Reviewed Films.  Bone Scan: Reviewed Films.    Notes:                     CLINICAL DATA: Prostate cancer, Gleason score 7 (4+3), T2b/3a, new  diagnosis   EXAM:  NUCLEAR MEDICINE WHOLE BODY BONE SCAN   TECHNIQUE:  Whole body anterior and posterior images were obtained approximately  3 hours after intravenous injection of radiopharmaceutical.   RADIOPHARMACEUTICALS: 21 mCi Technetium-57m MDP IV   COMPARISON: None   Correlation: CT abdomen and pelvis 07/30/2020   FINDINGS:  Uptake at sternoclavicular joints, AC joints, bilaterally in lower  lumbar spine, likely degenerative.   CT demonstrates BILATERAL facet degenerative changes L4-L5  corresponding to scintigraphic findings.   Uptake at RIGHT lateral aspect of lower cervical spine, nonspecific,  could be due to degenerative changes but imaging correlation  recommended to exclude metastatic disease.    No additional sites of abnormal osseous tracer accumulation are  identified.   Expected urinary tract and soft tissue distribution of tracer.   IMPRESSION:  Scattered areas of degenerative type uptake within additional  nonspecific focus of uptake at the RIGHT lateral aspect of the  inferior cervical spine, recommend imaging correlation.    Electronically Signed  By: Lavonia Dana M.D.  On: 07/30/2020 17:09   CLINICAL DATA: Prostate carcinoma. Staging.   EXAM:  CT ABDOMEN AND PELVIS WITH CONTRAST   TECHNIQUE:  Multidetector CT imaging of the abdomen and pelvis was performed  using the standard protocol following bolus administration of  intravenous contrast.   CONTRAST: 160mL OMNIPAQUE IOHEXOL 300 MG/ML SOLN   COMPARISON: None.   FINDINGS:  Lower Chest: No acute findings.   Hepatobiliary: 1.5 cm heterogeneous enhancing lesion is seen at the  junction of the right and left lobes focal fatty infiltration noted  adjacent to the falciform ligament. Gallbladder is unremarkable. No  evidence of biliary ductal dilatation.   Pancreas: No mass or inflammatory changes.  Spleen: Within normal limits in size and appearance.   Adrenals/Urinary Tract: No masses identified. No evidence of  ureteral calculi or hydronephrosis. Unremarkable unopacified urinary  bladder.   Stomach/Bowel: No evidence of obstruction, inflammatory process or  abnormal fluid collections. Diverticulosis is seen mainly involving  the sigmoid colon, however there is no evidence of diverticulitis.   Vascular/Lymphatic: No pathologically enlarged lymph nodes. No acute  vascular findings.   Reproductive: No mass or other significant abnormality.   Other: None.   Musculoskeletal: No suspicious bone lesions identified.   IMPRESSION:  1.5 cm heterogeneous enhancing hepatic mass, which is indeterminate.  Recommend abdomen MRI without and with contrast for further  characterization.   No evidence of  lymphadenopathy or bone metastases.   Colonic diverticulosis. No radiographic evidence of diverticulitis.    Electronically Signed  By: Marlaine Hind M.D.  On: 07/30/2020 12:08   CLINICAL DATA: Prostate cancer. Potential lesion seen on nuclear  medicine bone scan.   EXAM:  MRI CERVICAL SPINE WITHOUT CONTRAST   TECHNIQUE:  Multiplanar, multisequence Lonnie imaging of the cervical spine was  performed. No intravenous contrast was administered.   COMPARISON: None.   FINDINGS:  Alignment: Approximately 2-3 mm of retrolisthesis of C3 on C4, C5 on  C6 and C6 on C7.   Vertebrae: No focal marrow edema to suggest acute fracture or  discitis/osteomyelitis. No suspicious bone lesions. Sclerosis  surrounding the right C6-C7 facet joint, compatible with severe  facet arthropathy.   Cord: Normal cord signal.   Posterior Fossa, vertebral arteries, paraspinal tissues: Visualized  vertebral artery flow voids are maintained. Unremarkable visualized  posterior fossa limited sagittal sesamoid.   Disc levels:   C2-C3: Mild bilateral facet arthropathy without significant canal or  foraminal stenosis.   C3-C4: Posterior disc osteophyte complex and left greater than right  facet and uncovertebral hypertrophy with moderate left and mild  right foraminal stenosis. Moderate canal stenosis.   C4-C5: Small posterior disc osteophyte complex and mild bilateral  facet hypertrophy without significant canal or foraminal stenosis.   C5-C6: Left eccentric posterior disc osteophyte complex which  contacts and flattens the ventral cord with mild canal stenosis.  Moderate left and mild right foraminal stenosis.   C6-C7: Posterior disc osteophyte complex and bilateral uncovertebral  hypertrophy. Moderate to severe bilateral foraminal stenosis.  Moderate canal stenosis.   C7-T1: No significant disc protrusion, foraminal stenosis, or canal  stenosis.   IMPRESSION:  1. No specific evidence of metastatic  disease on this noncontrast  study. Severe right facet arthropathy at C7-T1, which may explain  the uptake seen on the recent bone scan.  2. Moderate to severe bilateral foraminal stenosis at C6-C7,  moderate foraminal stenosis on the left at C3-C4 and C5-C6, and  multilevel mild foraminal stenosis.  3. Moderate canal stenosis at C3-C4 and C6-C7 and mild canal  stenosis at C5-C6.  4. Approximately 2-3 mm of retrolisthesis of C3 on C4, C5 on C6 and  C6 on C7.    Electronically Signed  By: Margaretha Sheffield MD  On: 08/11/2020 07:55   MRI ABDOMEN WITHOUT AND WITH CONTRAST   TECHNIQUE:  Multiplanar multisequence Lonnie imaging of the abdomen was performed  both before and after the administration of intravenous contrast.   CONTRAST: 8.63mL GADAVIST GADOBUTROL 1 MMOL/ML IV SOLN   COMPARISON: CT AP 07/30/2020   FINDINGS:  Lower chest: No acute findings.   Hepatobiliary: Within the right hepatic lobe there is a T2  hyperintense and T1 hypointense lesion  measuring 1.6 cm, image 12/5.  This demonstrates early peripheral nodular enhancement with delayed  fill-in compatible with a benign hemangioma. There is a smaller  lesion within the posterior right hepatic lobe measuring 7 mm, image  12/5. This also exhibits early peripheral enhancement with delayed  fill-in compatible with a benign hemangioma. No additional liver  lesions identified. Normal appearance of the gallbladder. No biliary  dilatation.   Pancreas: No mass, inflammatory changes, or other parenchymal  abnormality identified.   Spleen: Within normal limits in size and appearance.   Adrenals/Urinary Tract: Normal adrenal glands. No kidney mass or  hydronephrosis.   Stomach/Bowel: Visualized portions within the abdomen are  unremarkable.   Vascular/Lymphatic: No pathologically enlarged lymph nodes  identified. No abdominal aortic aneurysm demonstrated.   Other: None.   Musculoskeletal: No suspicious bone lesions  identified.   IMPRESSION:  Two benign hemangiomas are identified within the right hepatic lobe.  No suspicious liver lesions identified.    Electronically Signed  By: Kerby Moors M.D.  On: 08/11/2020 10:37   PROCEDURES:          Urinalysis Dipstick Dipstick Cont'd  Color: Yellow Bilirubin: Neg mg/dL  Appearance: Clear Ketones: Neg mg/dL  Specific Gravity: 1.025 Blood: Neg ery/uL  pH: <=5.0 Protein: Neg mg/dL  Glucose: Neg mg/dL Urobilinogen: 0.2 mg/dL    Nitrites: Neg    Leukocyte Esterase: Neg leu/uL    ASSESSMENT:      ICD-10 Details  1 GU:   Prostate Cancer - C61   2 NON-GU:   Coronary Artery Disease - I25.9    PLAN:           Schedule Return Visit/Planned Activity: Next Available Appointment - PT Referral             Note: Please schedule patient for preoperative PT prior to radical prostatectomy.    Return Visit/Planned Activity: Other See Visit Notes             Note: Please schedule radiation oncology consultation with Dr. Tammi Klippel  Return Visit/Planned Activity: Other See Visit Notes             Note: Will call to schedule surgery.          Document Letter(s):  Created for Patient: Clinical Summary         Notes:   1. Locally advanced prostate cancer: I had a detailed discussion with Lonnie. Fitzsimmons and his wife today. We reviewed options for curative treatment of his locally advanced prostate cancer. Considering his age and life expectancy as well as his disease parameters, I did recommend either primary surgical therapy possibly in the context of multimodality treatment vs. the option of proceeding with long-term androgen deprivation and external beam radiation therapy. The patient was counseled about the natural history of prostate cancer and the standard treatment options that are available for prostate cancer. It was explained to him how his age and life expectancy, clinical stage, Gleason score, and PSA affect his prognosis, the decision to proceed with  additional staging studies, as well as how that information influences recommended treatment strategies. We discussed the roles for active surveillance, radiation therapy, surgical therapy, androgen deprivation, as well as ablative therapy options for the treatment of prostate cancer as appropriate to his individual cancer situation. We discussed the risks and benefits of these options with regard to their impact on cancer control and also in terms of potential adverse events, complications, and impact on quality of life particularly related to urinary and  sexual function. The patient was encouraged to ask questions throughout the discussion today and all questions were answered to his stated satisfaction. In addition, the patient was provided with and/or directed to appropriate resources and literature for further education about prostate cancer and treatment options. We discussed surgical therapy for prostate cancer including the different available surgical approaches. We discussed, in detail, the risks and expectations of surgery with regard to cancer control, urinary control, and erectile function as well as the expected postoperative recovery process. Additional risks of surgery including but not limited to bleeding, infection, hernia formation, nerve damage, lymphocele formation, bowel/rectal injury potentially necessitating colostomy, damage to the urinary tract resulting in urine leakage, urethral stricture, and the cardiopulmonary risks such as myocardial infarction, stroke, death, venothromboembolism, etc. were explained. The risk of open surgical conversion for robotic/laparoscopic prostatectomy was also discussed.   After considering his options, he is strongly leaning toward primary surgical therapy. However, I did recommend any is amenable to proceeding with radiation oncology consultation regardless. Tentatively, he would like to be scheduled for surgery and will be scheduled for a unilateral right  nerve-sparing robot assisted laparoscopic radical prostatectomy and bilateral pelvic lymphadenectomy. Obviously, we will changes approach should he change his mind about radiation therapy although he appears to be leaning away from androgen deprivation therapy as part of his treatment if possible. Considering his cardiac history, we will obtain cardiac clearance from Dr. Audie Box.   Cc: Dr. Irine Seal  Dr. Tyler Pita  Dr. Donald Prose  Dr. Eleonore Chiquito           E & M CODES: We spent 87 minutes dedicated to evaluation and management time, including face to face interaction, discussions on coordination of care, documentation, result review, and discussion with others as applicable.     * Signed by Raynelle Bring, M.D. on 08/11/20 at 6:34 PM (EDT*

## 2020-09-10 ENCOUNTER — Ambulatory Visit (HOSPITAL_COMMUNITY): Payer: 59 | Admitting: Emergency Medicine

## 2020-09-10 ENCOUNTER — Ambulatory Visit (HOSPITAL_COMMUNITY): Payer: 59 | Admitting: Anesthesiology

## 2020-09-10 ENCOUNTER — Encounter (HOSPITAL_COMMUNITY): Payer: Self-pay | Admitting: Urology

## 2020-09-10 ENCOUNTER — Encounter (HOSPITAL_COMMUNITY)
Admission: RE | Disposition: A | Discharge status: Home / Self Care | Payer: Self-pay | Source: Home / Self Care | Attending: Urology

## 2020-09-10 ENCOUNTER — Other Ambulatory Visit: Payer: Self-pay

## 2020-09-10 ENCOUNTER — Observation Stay (HOSPITAL_COMMUNITY)
Admission: RE | Admit: 2020-09-10 | Discharge: 2020-09-12 | Disposition: A | Discharge status: Home / Self Care | Payer: 59 | Attending: Urology | Admitting: Urology

## 2020-09-10 DIAGNOSIS — Z7901 Long term (current) use of anticoagulants: Secondary | ICD-10-CM | POA: Insufficient documentation

## 2020-09-10 DIAGNOSIS — D360 Benign neoplasm of lymph nodes: Secondary | ICD-10-CM | POA: Diagnosis not present

## 2020-09-10 DIAGNOSIS — Z79899 Other long term (current) drug therapy: Secondary | ICD-10-CM | POA: Diagnosis not present

## 2020-09-10 DIAGNOSIS — C61 Malignant neoplasm of prostate: Secondary | ICD-10-CM | POA: Diagnosis not present

## 2020-09-10 DIAGNOSIS — I2510 Atherosclerotic heart disease of native coronary artery without angina pectoris: Secondary | ICD-10-CM | POA: Insufficient documentation

## 2020-09-10 DIAGNOSIS — I10 Essential (primary) hypertension: Secondary | ICD-10-CM | POA: Insufficient documentation

## 2020-09-10 DIAGNOSIS — I4891 Unspecified atrial fibrillation: Secondary | ICD-10-CM | POA: Insufficient documentation

## 2020-09-10 HISTORY — PX: LYMPHADENECTOMY: SHX5960

## 2020-09-10 HISTORY — PX: ROBOT ASSISTED LAPAROSCOPIC RADICAL PROSTATECTOMY: SHX5141

## 2020-09-10 LAB — APTT: aPTT: 25 seconds (ref 24–36)

## 2020-09-10 LAB — TYPE AND SCREEN
ABO/RH(D): B POS
Antibody Screen: NEGATIVE

## 2020-09-10 LAB — PROTIME-INR
INR: 1 (ref 0.8–1.2)
Prothrombin Time: 13 seconds (ref 11.4–15.2)

## 2020-09-10 LAB — ABO/RH: ABO/RH(D): B POS

## 2020-09-10 LAB — HEMOGLOBIN AND HEMATOCRIT, BLOOD
HCT: 45.3 % (ref 39.0–52.0)
Hemoglobin: 15.2 g/dL (ref 13.0–17.0)

## 2020-09-10 SURGERY — XI ROBOTIC ASSISTED LAPAROSCOPIC RADICAL PROSTATECTOMY LEVEL 2
Anesthesia: General | Site: Prostate

## 2020-09-10 MED ORDER — METOPROLOL SUCCINATE ER 50 MG PO TB24
50.0000 mg | ORAL_TABLET | Freq: Every day | ORAL | Status: DC
  Administered 2020-09-11: 50 mg via ORAL
  Filled 2020-09-10 (×2): qty 1

## 2020-09-10 MED ORDER — PROPOFOL 10 MG/ML IV BOLUS
INTRAVENOUS | Status: AC
  Filled 2020-09-10: qty 40

## 2020-09-10 MED ORDER — FENTANYL CITRATE (PF) 100 MCG/2ML IJ SOLN
100.0000 ug | INTRAMUSCULAR | Status: DC | PRN
  Administered 2020-09-10: 100 ug via INTRAVENOUS

## 2020-09-10 MED ORDER — BUPIVACAINE-EPINEPHRINE 0.25% -1:200000 IJ SOLN
INTRAMUSCULAR | Status: AC
  Filled 2020-09-10: qty 1

## 2020-09-10 MED ORDER — BELLADONNA ALKALOIDS-OPIUM 16.2-60 MG RE SUPP: 1.0000 | Freq: Four times a day (QID) | RECTAL | Status: DC | PRN

## 2020-09-10 MED ORDER — BACITRACIN-NEOMYCIN-POLYMYXIN 400-5-5000 EX OINT: 1.0000 "application " | TOPICAL_OINTMENT | Freq: Three times a day (TID) | CUTANEOUS | Status: DC | PRN

## 2020-09-10 MED ORDER — FENTANYL CITRATE (PF) 100 MCG/2ML IJ SOLN
INTRAMUSCULAR | Status: AC
  Administered 2020-09-10: 100 ug via INTRAVENOUS
  Filled 2020-09-10: qty 2

## 2020-09-10 MED ORDER — SODIUM CHLORIDE 0.9 % IV SOLN
2.0000 g | Freq: Once | INTRAVENOUS | Status: AC
  Administered 2020-09-10: 2 g via INTRAVENOUS
  Filled 2020-09-10: qty 2

## 2020-09-10 MED ORDER — SIMVASTATIN 20 MG PO TABS
20.0000 mg | ORAL_TABLET | Freq: Every day | ORAL | Status: DC
  Administered 2020-09-10 – 2020-09-11 (×2): 20 mg via ORAL
  Filled 2020-09-10 (×2): qty 1

## 2020-09-10 MED ORDER — ROCURONIUM BROMIDE 10 MG/ML (PF) SYRINGE
PREFILLED_SYRINGE | INTRAVENOUS | Status: AC
  Filled 2020-09-10: qty 10

## 2020-09-10 MED ORDER — HYDROCODONE-ACETAMINOPHEN 5-325 MG PO TABS: 1.0000 | ORAL_TABLET | Freq: Four times a day (QID) | ORAL | 0 refills | Status: AC | PRN

## 2020-09-10 MED ORDER — CHLORHEXIDINE GLUCONATE 0.12 % MT SOLN
15.0000 mL | Freq: Once | OROMUCOSAL | Status: AC
  Administered 2020-09-10: 15 mL via OROMUCOSAL

## 2020-09-10 MED ORDER — HYDROMORPHONE HCL 1 MG/ML IJ SOLN
0.2500 mg | INTRAMUSCULAR | Status: DC | PRN
  Administered 2020-09-10 (×4): 0.5 mg via INTRAVENOUS

## 2020-09-10 MED ORDER — MORPHINE SULFATE (PF) 2 MG/ML IV SOLN
2.0000 mg | INTRAVENOUS | Status: DC | PRN
  Administered 2020-09-10 (×2): 2 mg via INTRAVENOUS
  Filled 2020-09-10 (×2): qty 1

## 2020-09-10 MED ORDER — ROCURONIUM BROMIDE 10 MG/ML (PF) SYRINGE
PREFILLED_SYRINGE | INTRAVENOUS | Status: DC | PRN
  Administered 2020-09-10: 5 mg via INTRAVENOUS
  Administered 2020-09-10: 15 mg via INTRAVENOUS
  Administered 2020-09-10: 100 mg via INTRAVENOUS

## 2020-09-10 MED ORDER — EZETIMIBE 10 MG PO TABS
10.0000 mg | ORAL_TABLET | Freq: Every day | ORAL | Status: DC
  Administered 2020-09-10 – 2020-09-11 (×2): 10 mg via ORAL
  Filled 2020-09-10 (×2): qty 1

## 2020-09-10 MED ORDER — DEXAMETHASONE SODIUM PHOSPHATE 10 MG/ML IJ SOLN
INTRAMUSCULAR | Status: DC | PRN
  Administered 2020-09-10: 10 mg via INTRAVENOUS

## 2020-09-10 MED ORDER — CHLORHEXIDINE GLUCONATE CLOTH 2 % EX PADS
6.0000 | MEDICATED_PAD | Freq: Every day | CUTANEOUS | Status: DC
  Administered 2020-09-10 – 2020-09-12 (×3): 6 via TOPICAL

## 2020-09-10 MED ORDER — HYDROMORPHONE HCL 1 MG/ML IJ SOLN
INTRAMUSCULAR | Status: AC
  Filled 2020-09-10: qty 1

## 2020-09-10 MED ORDER — CYCLOBENZAPRINE HCL 10 MG PO TABS
10.0000 mg | ORAL_TABLET | Freq: Once | ORAL | Status: AC
  Administered 2020-09-10: 10 mg via ORAL
  Filled 2020-09-10: qty 1

## 2020-09-10 MED ORDER — LACTATED RINGERS IV SOLN
INTRAVENOUS | Status: DC | PRN
  Administered 2020-09-10: 1001 mL

## 2020-09-10 MED ORDER — ONDANSETRON HCL 4 MG/2ML IJ SOLN
INTRAMUSCULAR | Status: DC | PRN
  Administered 2020-09-10: 4 mg via INTRAVENOUS

## 2020-09-10 MED ORDER — OXYCODONE HCL 5 MG PO TABS
5.0000 mg | ORAL_TABLET | Freq: Once | ORAL | Status: AC | PRN
  Administered 2020-09-10: 5 mg via ORAL

## 2020-09-10 MED ORDER — OXYCODONE HCL 5 MG/5ML PO SOLN: 5.0000 mg | Freq: Once | ORAL | Status: AC | PRN

## 2020-09-10 MED ORDER — ACETAMINOPHEN 325 MG PO TABS: 650.0000 mg | ORAL_TABLET | ORAL | Status: DC | PRN

## 2020-09-10 MED ORDER — PHENYLEPHRINE HCL (PRESSORS) 10 MG/ML IV SOLN
INTRAVENOUS | Status: AC
  Filled 2020-09-10: qty 1

## 2020-09-10 MED ORDER — ZOLPIDEM TARTRATE 5 MG PO TABS: 5.0000 mg | ORAL_TABLET | Freq: Every evening | ORAL | Status: DC | PRN

## 2020-09-10 MED ORDER — HYDROCODONE-ACETAMINOPHEN 5-325 MG PO TABS
1.0000 | ORAL_TABLET | ORAL | Status: DC | PRN
  Administered 2020-09-10 – 2020-09-12 (×5): 1 via ORAL
  Filled 2020-09-10 (×6): qty 1

## 2020-09-10 MED ORDER — CEFAZOLIN SODIUM-DEXTROSE 1-4 GM/50ML-% IV SOLN
1.0000 g | Freq: Three times a day (TID) | INTRAVENOUS | Status: AC
  Administered 2020-09-10 (×2): 1 g via INTRAVENOUS
  Filled 2020-09-10 (×2): qty 50

## 2020-09-10 MED ORDER — PHENYLEPHRINE 40 MCG/ML (10ML) SYRINGE FOR IV PUSH (FOR BLOOD PRESSURE SUPPORT)
PREFILLED_SYRINGE | INTRAVENOUS | Status: DC | PRN
  Administered 2020-09-10: 80 ug via INTRAVENOUS
  Administered 2020-09-10: 120 ug via INTRAVENOUS
  Administered 2020-09-10: 80 ug via INTRAVENOUS
  Administered 2020-09-10: 120 ug via INTRAVENOUS

## 2020-09-10 MED ORDER — CYCLOBENZAPRINE HCL 10 MG PO TABS
10.0000 mg | ORAL_TABLET | Freq: Every day | ORAL | Status: DC
  Administered 2020-09-10 – 2020-09-11 (×2): 10 mg via ORAL
  Filled 2020-09-10 (×2): qty 1

## 2020-09-10 MED ORDER — SULFAMETHOXAZOLE-TRIMETHOPRIM 800-160 MG PO TABS: 1.0000 | ORAL_TABLET | Freq: Two times a day (BID) | ORAL | 0 refills | Status: DC

## 2020-09-10 MED ORDER — ORAL CARE MOUTH RINSE: 15.0000 mL | Freq: Once | OROMUCOSAL | Status: AC

## 2020-09-10 MED ORDER — MAGNESIUM CITRATE PO SOLN: 1.0000 | Freq: Once | ORAL | Status: DC

## 2020-09-10 MED ORDER — KETOROLAC TROMETHAMINE 30 MG/ML IJ SOLN
INTRAMUSCULAR | Status: AC
  Filled 2020-09-10: qty 1

## 2020-09-10 MED ORDER — ONDANSETRON HCL 4 MG/2ML IJ SOLN
4.0000 mg | INTRAMUSCULAR | Status: DC | PRN
  Administered 2020-09-11 (×2): 4 mg via INTRAVENOUS
  Filled 2020-09-10 (×2): qty 2

## 2020-09-10 MED ORDER — HYDROMORPHONE HCL 1 MG/ML IJ SOLN
INTRAMUSCULAR | Status: DC | PRN
  Administered 2020-09-10: 1 mg via INTRAVENOUS

## 2020-09-10 MED ORDER — PROPOFOL 10 MG/ML IV BOLUS
INTRAVENOUS | Status: DC | PRN
  Administered 2020-09-10: 130 mg via INTRAVENOUS

## 2020-09-10 MED ORDER — DOCUSATE SODIUM 100 MG PO CAPS
100.0000 mg | ORAL_CAPSULE | Freq: Two times a day (BID) | ORAL | Status: DC
  Administered 2020-09-10 – 2020-09-12 (×5): 100 mg via ORAL
  Filled 2020-09-10 (×5): qty 1

## 2020-09-10 MED ORDER — EPHEDRINE 5 MG/ML INJ
INTRAVENOUS | Status: AC
  Filled 2020-09-10: qty 10

## 2020-09-10 MED ORDER — LACTATED RINGERS IV SOLN: INTRAVENOUS | Status: DC

## 2020-09-10 MED ORDER — MIDAZOLAM HCL 2 MG/2ML IJ SOLN
INTRAMUSCULAR | Status: AC
  Filled 2020-09-10: qty 2

## 2020-09-10 MED ORDER — LIDOCAINE 2% (20 MG/ML) 5 ML SYRINGE
INTRAMUSCULAR | Status: DC | PRN
  Administered 2020-09-10: 80 mg via INTRAVENOUS

## 2020-09-10 MED ORDER — LISINOPRIL-HYDROCHLOROTHIAZIDE 10-12.5 MG PO TABS: 1.0000 | ORAL_TABLET | Freq: Every day | ORAL | Status: DC

## 2020-09-10 MED ORDER — HYDROMORPHONE HCL 2 MG/ML IJ SOLN
INTRAMUSCULAR | Status: AC
  Filled 2020-09-10: qty 1

## 2020-09-10 MED ORDER — SUGAMMADEX SODIUM 200 MG/2ML IV SOLN
INTRAVENOUS | Status: DC | PRN
  Administered 2020-09-10: 200 mg via INTRAVENOUS

## 2020-09-10 MED ORDER — HEPARIN SODIUM (PORCINE) 1000 UNIT/ML IJ SOLN
INTRAMUSCULAR | Status: AC
  Filled 2020-09-10: qty 1

## 2020-09-10 MED ORDER — KETOROLAC TROMETHAMINE 30 MG/ML IJ SOLN
30.0000 mg | Freq: Once | INTRAMUSCULAR | Status: AC | PRN
  Administered 2020-09-10: 30 mg via INTRAVENOUS

## 2020-09-10 MED ORDER — SODIUM CHLORIDE 0.9 % IR SOLN
Status: DC | PRN
  Administered 2020-09-10: 1000 mL via INTRAVESICAL

## 2020-09-10 MED ORDER — SODIUM CHLORIDE 0.9 % IV BOLUS
1000.0000 mL | Freq: Once | INTRAVENOUS | Status: AC
  Administered 2020-09-10: 1000 mL via INTRAVENOUS

## 2020-09-10 MED ORDER — FENTANYL CITRATE (PF) 100 MCG/2ML IJ SOLN
INTRAMUSCULAR | Status: DC | PRN
  Administered 2020-09-10 (×5): 50 ug via INTRAVENOUS

## 2020-09-10 MED ORDER — FLEET ENEMA 7-19 GM/118ML RE ENEM: 1.0000 | ENEMA | Freq: Once | RECTAL | Status: DC

## 2020-09-10 MED ORDER — MIDAZOLAM HCL 5 MG/5ML IJ SOLN
INTRAMUSCULAR | Status: DC | PRN
  Administered 2020-09-10: 2 mg via INTRAVENOUS

## 2020-09-10 MED ORDER — ONDANSETRON HCL 4 MG/2ML IJ SOLN: 4.0000 mg | Freq: Once | INTRAMUSCULAR | Status: DC | PRN

## 2020-09-10 MED ORDER — BUPIVACAINE-EPINEPHRINE 0.25% -1:200000 IJ SOLN
INTRAMUSCULAR | Status: DC | PRN
  Administered 2020-09-10: 10 mL
  Administered 2020-09-10: 40 mL

## 2020-09-10 MED ORDER — KCL IN DEXTROSE-NACL 20-5-0.45 MEQ/L-%-% IV SOLN
INTRAVENOUS | Status: DC
  Filled 2020-09-10 (×3): qty 1000

## 2020-09-10 MED ORDER — FENTANYL CITRATE (PF) 100 MCG/2ML IJ SOLN
INTRAMUSCULAR | Status: AC
  Filled 2020-09-10: qty 2

## 2020-09-10 MED ORDER — LISINOPRIL 10 MG PO TABS
10.0000 mg | ORAL_TABLET | Freq: Every day | ORAL | Status: DC
  Administered 2020-09-10 – 2020-09-11 (×2): 10 mg via ORAL
  Filled 2020-09-10 (×3): qty 1

## 2020-09-10 MED ORDER — KETOROLAC TROMETHAMINE 15 MG/ML IJ SOLN
15.0000 mg | Freq: Four times a day (QID) | INTRAMUSCULAR | Status: AC
  Administered 2020-09-10 – 2020-09-11 (×6): 15 mg via INTRAVENOUS
  Filled 2020-09-10 (×6): qty 1

## 2020-09-10 MED ORDER — EPHEDRINE SULFATE-NACL 50-0.9 MG/10ML-% IV SOSY
PREFILLED_SYRINGE | INTRAVENOUS | Status: DC | PRN
  Administered 2020-09-10 (×3): 5 mg via INTRAVENOUS
  Administered 2020-09-10: 10 mg via INTRAVENOUS
  Administered 2020-09-10: 5 mg via INTRAVENOUS

## 2020-09-10 MED ORDER — MEPERIDINE HCL 50 MG/ML IJ SOLN: 6.2500 mg | Freq: Once | INTRAMUSCULAR | Status: AC

## 2020-09-10 MED ORDER — DIPHENHYDRAMINE HCL 50 MG/ML IJ SOLN: 12.5000 mg | Freq: Four times a day (QID) | INTRAMUSCULAR | Status: DC | PRN

## 2020-09-10 MED ORDER — LIDOCAINE 2% (20 MG/ML) 5 ML SYRINGE
INTRAMUSCULAR | Status: AC
  Filled 2020-09-10: qty 15

## 2020-09-10 MED ORDER — DIPHENHYDRAMINE HCL 12.5 MG/5ML PO ELIX: 12.5000 mg | ORAL_SOLUTION | Freq: Four times a day (QID) | ORAL | Status: DC | PRN

## 2020-09-10 MED ORDER — FENTANYL CITRATE (PF) 250 MCG/5ML IJ SOLN
INTRAMUSCULAR | Status: AC
  Filled 2020-09-10: qty 5

## 2020-09-10 MED ORDER — MEPERIDINE HCL 50 MG/ML IJ SOLN
INTRAMUSCULAR | Status: AC
  Administered 2020-09-10: 6.25 mg via INTRAVENOUS
  Filled 2020-09-10: qty 1

## 2020-09-10 MED ORDER — OXYCODONE HCL 5 MG PO TABS
ORAL_TABLET | ORAL | Status: AC
  Filled 2020-09-10: qty 1

## 2020-09-10 MED ORDER — PHENYLEPHRINE HCL-NACL 10-0.9 MG/250ML-% IV SOLN
INTRAVENOUS | Status: DC | PRN
  Administered 2020-09-10: 25 ug/min via INTRAVENOUS

## 2020-09-10 MED ORDER — DOCUSATE SODIUM 100 MG PO CAPS: 100.0000 mg | ORAL_CAPSULE | Freq: Two times a day (BID) | ORAL | Status: DC

## 2020-09-10 MED ORDER — HYDROCHLOROTHIAZIDE 12.5 MG PO CAPS
12.5000 mg | ORAL_CAPSULE | Freq: Every day | ORAL | Status: DC
  Administered 2020-09-10 – 2020-09-11 (×2): 12.5 mg via ORAL
  Filled 2020-09-10 (×3): qty 1

## 2020-09-10 SURGICAL SUPPLY — 69 items
ADH SKN CLS APL DERMABOND .7 (GAUZE/BANDAGES/DRESSINGS) ×2
APL PRP STRL LF DISP 70% ISPRP (MISCELLANEOUS) ×2
APL SWBSTK 6 STRL LF DISP (MISCELLANEOUS) ×4
APPLICATOR COTTON TIP 6 STRL (MISCELLANEOUS) ×2 IMPLANT
APPLICATOR COTTON TIP 6IN STRL (MISCELLANEOUS) ×6
BAG COUNTER SPONGE SURGICOUNT (BAG) IMPLANT
BAG SPNG CNTER NS LX DISP (BAG)
CATH FOLEY 2WAY SLVR 18FR 30CC (CATHETERS) ×3 IMPLANT
CATH ROBINSON RED A/P 16FR (CATHETERS) ×3 IMPLANT
CATH ROBINSON RED A/P 8FR (CATHETERS) ×3 IMPLANT
CATH TIEMANN FOLEY 18FR 5CC (CATHETERS) ×3 IMPLANT
CHLORAPREP W/TINT 26 (MISCELLANEOUS) ×3 IMPLANT
CLIP VESOLOCK LG 6/CT PURPLE (CLIP) ×6 IMPLANT
COVER SURGICAL LIGHT HANDLE (MISCELLANEOUS) ×3 IMPLANT
COVER TIP SHEARS 8 DVNC (MISCELLANEOUS) ×2 IMPLANT
COVER TIP SHEARS 8MM DA VINCI (MISCELLANEOUS) ×3
CUTTER ECHEON FLEX ENDO 45 340 (ENDOMECHANICALS) ×3 IMPLANT
DECANTER SPIKE VIAL GLASS SM (MISCELLANEOUS) ×3 IMPLANT
DERMABOND ADVANCED (GAUZE/BANDAGES/DRESSINGS) ×1
DERMABOND ADVANCED .7 DNX12 (GAUZE/BANDAGES/DRESSINGS) ×2 IMPLANT
DRAIN CHANNEL RND F F (WOUND CARE) IMPLANT
DRAPE ARM DVNC X/XI (DISPOSABLE) ×8 IMPLANT
DRAPE COLUMN DVNC XI (DISPOSABLE) ×2 IMPLANT
DRAPE DA VINCI XI ARM (DISPOSABLE) ×12
DRAPE DA VINCI XI COLUMN (DISPOSABLE) ×3
DRAPE SURG IRRIG POUCH 19X23 (DRAPES) ×3 IMPLANT
DRSG TEGADERM 4X4.75 (GAUZE/BANDAGES/DRESSINGS) ×3 IMPLANT
ELECT PENCIL ROCKER SW 15FT (MISCELLANEOUS) ×3 IMPLANT
ELECT REM PT RETURN 15FT ADLT (MISCELLANEOUS) ×3 IMPLANT
GAUZE SPONGE 4X4 12PLY STRL (GAUZE/BANDAGES/DRESSINGS) IMPLANT
GLOVE SURG ENC MOIS LTX SZ6.5 (GLOVE) ×3 IMPLANT
GLOVE SURG ENC TEXT LTX SZ7.5 (GLOVE) ×6 IMPLANT
GLOVE SURG UNDER POLY LF SZ6 (GLOVE) ×2 IMPLANT
GLOVE SURG UNDER POLY LF SZ7 (GLOVE) ×1 IMPLANT
GOWN STRL REUS W/TWL LRG LVL3 (GOWN DISPOSABLE) ×9 IMPLANT
HOLDER FOLEY CATH W/STRAP (MISCELLANEOUS) ×3 IMPLANT
IRRIG SUCT STRYKERFLOW 2 WTIP (MISCELLANEOUS) ×3
IRRIGATION SUCT STRKRFLW 2 WTP (MISCELLANEOUS) ×2 IMPLANT
IV LACTATED RINGERS 1000ML (IV SOLUTION) ×3 IMPLANT
KIT TURNOVER KIT A (KITS) ×3 IMPLANT
NDL SAFETY ECLIPSE 18X1.5 (NEEDLE) ×2 IMPLANT
NEEDLE HYPO 18GX1.5 SHARP (NEEDLE) ×3
PACK ROBOT UROLOGY CUSTOM (CUSTOM PROCEDURE TRAY) ×3 IMPLANT
PENCIL SMOKE EVACUATOR (MISCELLANEOUS) IMPLANT
RELOAD STAPLE 45 4.1 GRN THCK (STAPLE) ×2 IMPLANT
SEAL CANN UNIV 5-8 DVNC XI (MISCELLANEOUS) ×8 IMPLANT
SEAL XI 5MM-8MM UNIVERSAL (MISCELLANEOUS) ×12
SET TUBE SMOKE EVAC HIGH FLOW (TUBING) ×3 IMPLANT
SOL ANTI FOG 6CC (MISCELLANEOUS) IMPLANT
SOL PREP PROV IODINE SCRUB 4OZ (MISCELLANEOUS) ×1 IMPLANT
SOLUTION ANTI FOG 6CC (MISCELLANEOUS) ×1
SOLUTION ELECTROLUBE (MISCELLANEOUS) ×3 IMPLANT
STAPLE RELOAD 45 GRN (STAPLE) ×2 IMPLANT
STAPLE RELOAD 45MM GREEN (STAPLE) ×3
SUT ETHILON 3 0 PS 1 (SUTURE) ×3 IMPLANT
SUT MNCRL 3 0 RB1 (SUTURE) ×2 IMPLANT
SUT MNCRL 3 0 VIOLET RB1 (SUTURE) ×2 IMPLANT
SUT MNCRL AB 4-0 PS2 18 (SUTURE) ×6 IMPLANT
SUT MONOCRYL 3 0 RB1 (SUTURE) ×6
SUT VIC AB 0 CT1 27 (SUTURE) ×3
SUT VIC AB 0 CT1 27XBRD ANTBC (SUTURE) ×2 IMPLANT
SUT VIC AB 0 UR5 27 (SUTURE) ×3 IMPLANT
SUT VIC AB 2-0 SH 27 (SUTURE) ×3
SUT VIC AB 2-0 SH 27X BRD (SUTURE) ×2 IMPLANT
SUT VICRYL 0 UR6 27IN ABS (SUTURE) ×6 IMPLANT
SYR 27GX1/2 1ML LL SAFETY (SYRINGE) ×3 IMPLANT
TOWEL OR NON WOVEN STRL DISP B (DISPOSABLE) ×2 IMPLANT
TROCAR XCEL NON-BLD 5MMX100MML (ENDOMECHANICALS) IMPLANT
WATER STERILE IRR 1000ML POUR (IV SOLUTION) ×3 IMPLANT

## 2020-09-10 NOTE — Progress Notes (Signed)
Patient ID: Lonnie Palmer, male   DOB: 10/26/1955, 65 y.o.   MRN: [id number]  Post-op note  Subjective: The patient is doing well.  No complaints.  Objective: Vital signs in last 24 hours: Temp:  [97.8 F (36.6 C)-98.3 F (36.8 C)] 97.8 F (36.6 C) (07/14 1243) Pulse Rate:  [72-83] 82 (07/14 1243) Resp:  [10-18] 16 (07/14 1243) BP: (131-159)/(71-94) 131/75 (07/14 1243) SpO2:  [98 %-100 %] 100 % (07/14 1218) Weight:  [79.3 kg] 79.3 kg (07/14 1243)  Intake/Output from previous day: No intake/output data recorded. Intake/Output this shift: Total I/O In: 2240 [P.O.:240; I.V.:1000; IV Piggyback:1000] Out: 185 [Drains:35; Blood:150]  Physical Exam:  Lonnie: Alert and oriented. Abdomen: Soft, Nondistended. Incisions: Clean and dry. GU: Urine clearing.  Lab Results: Recent Labs    09/10/20 1034  HGB 15.2  HCT 45.3    Assessment/Plan: POD#0   1) Continue to monitor, ambulate, IS   Lonnie Palmer. MD   LOS: 0 days   Lonnie Palmer 09/10/2020, 3:25 PM

## 2020-09-10 NOTE — Addendum Note (Signed)
Addendum  created 09/10/20 1238 by Lavina Hamman, CRNA   Intraprocedure Meds edited

## 2020-09-10 NOTE — Interval H&P Note (Signed)
History and Physical Interval Note:  09/10/2020 7:09 AM  Lonnie Palmer  has presented today for surgery, with the diagnosis of PROSTATE CANCER.  The various methods of treatment have been discussed with the patient and family. After consideration of risks, benefits and other options for treatment, the patient has consented to  Procedure(s): XI ROBOTIC ASSISTED LAPAROSCOPIC RADICAL PROSTATECTOMY LEVEL 2 (N/A) LYMPHADENECTOMY, PELVIC (Bilateral) as a surgical intervention.  The patient's history has been reviewed, patient examined, no change in status, stable for surgery.  I have reviewed the patient's chart and labs.  Questions were answered to the patient's satisfaction.     [employer]

## 2020-09-10 NOTE — Discharge Instructions (Addendum)
Activity:  You are encouraged to ambulate frequently (about every hour during waking hours) to help prevent blood clots from forming in your legs or lungs.  However, you should not engage in any heavy lifting (> 10-15 lbs), strenuous activity, or straining. Diet: You should continue a clear liquid diet until passing gas from below.  Once this occurs, you may advance your diet to a soft diet that would be easy to digest (i.e soups, scrambled eggs, mashed potatoes, etc.) for 24 hours just as you would if getting over a bad stomach flu.  If tolerating this diet well for 24 hours, you may then begin eating regular food.  It will be normal to have some amount of bloating, nausea, and abdominal discomfort intermittently. Prescriptions:  You will be provided a prescription for pain medication to take as needed.  If your pain is not severe enough to require the prescription pain medication, you may take Tylenol instead.  You should also take an over the counter stool softener (Colace 100 mg twice daily) to avoid straining with bowel movements as the pain medication may constipate you. Finally, you will also be provided a prescription for an antibiotic to begin the day prior to your return visit in the office for catheter removal. Catheter care: You will be taught how to take care of the catheter by the nursing staff prior to discharge from the hospital.  You may use both a leg bag and the larger bedside bag but it is recommended to at least use the bigger bedside bag at nighttime as the leg bag is small and will fill up overnight and also does not drain as well when lying flat. You may periodically feel a strong urge to void with the catheter in place.  This is a bladder spasm and most often can occur when having a bowel movement or when you are moving around. It is typically self-limited and usually will stop after a few minutes.  You may use some Vaseline or Neosporin around the tip of the catheter to reduce friction  at the tip of the penis. Incisions: You may remove your dressing bandages the 2nd day after surgery.  You most likely will have a few small staples in each of the incisions and once the bandages are removed, the incisions may stay open to air.  You may start showering (not soaking or bathing in water) 48 hours after surgery and the incisions simply need to be patted dry after the shower.  No additional care is needed. What to call us about: You should call the office [phone]) if you develop fever > 101, persistent vomiting, or the catheter stops draining. Also, feel free to call with any other questions you may have and remember the handout that was provided to you as a reference preoperatively which answers many of the common questions that arise after surgery. You may resume aspirin, advil, aleve, vitamins, and supplements 7 days after surgery.   Restart Eliquis as discussed with Dr. Alinda Money.

## 2020-09-10 NOTE — Op Note (Signed)
Preoperative diagnosis: Clinically localized adenocarcinoma of the prostate (clinical stage T3a N0 M0)  Postoperative diagnosis: Clinically localized adenocarcinoma of the prostate (clinical stage T3a N0 M0)  Procedure:  Robotic assisted laparoscopic radical prostatectomy (right nerve sparing) Bilateral robotic assisted laparoscopic pelvic lymphadenectomy  Surgeon: Pryor Curia. M.D.  Assistant(s): Debbrah Alar, PA-C  An assistant was required for this surgical procedure.  The duties of the assistant included but were not limited to suctioning, passing suture, camera manipulation, retraction. This procedure would not be able to be performed without an Environmental consultant.   Anesthesia: General  Complications: None  EBL: 150 mL  IVF:  1000 mL crystalloid  Specimens: Prostate and seminal vesicles Right pelvic lymph nodes Left pelvic lymph nodes  Disposition of specimens: Pathology  Drains: 20 Fr coude catheter # 19 Blake pelvic drain  Indication: Lonnie Palmer is a 65 y.o. patient with clinically localized prostate cancer.  After a thorough review of the management options for treatment of prostate cancer, he elected to proceed with surgical therapy and the above procedure(s).  We have discussed the potential benefits and risks of the procedure, side effects of the proposed treatment, the likelihood of the patient achieving the goals of the procedure, and any potential problems that might occur during the procedure or recuperation. Informed consent has been obtained.  Description of procedure:  The patient was taken to the operating room and a general anesthetic was administered. He was given preoperative antibiotics, placed in the dorsal lithotomy position, and prepped and draped in the usual sterile fashion. Next a preoperative timeout was performed. A urethral catheter was placed into the bladder and a site was selected near the umbilicus for placement of the camera port.  This was placed using a standard open Hassan technique which allowed entry into the peritoneal cavity under direct vision and without difficulty. An 8 mm port was placed and a pneumoperitoneum established. The camera was then used to inspect the abdomen and there was no evidence of any intra-abdominal injuries or other abnormalities. The remaining abdominal ports were then placed. 8 mm robotic ports were placed in the right lower quadrant, left lower quadrant, and far left lateral abdominal wall. A 5 mm port was placed in the right upper quadrant and a 12 mm port was placed in the right lateral abdominal wall for laparoscopic assistance. All ports were placed under direct vision without difficulty. The surgical cart was then docked.   Utilizing the cautery scissors, the bladder was reflected posteriorly allowing entry into the space of Retzius and identification of the endopelvic fascia and prostate. The periprostatic fat was then removed from the prostate allowing full exposure of the endopelvic fascia. The endopelvic fascia was then incised from the apex back to the base of the prostate bilaterally and the underlying levator muscle fibers were swept laterally off the prostate thereby isolating the dorsal venous complex. The dorsal vein was then stapled and divided with a 45 mm Flex Echelon stapler. Attention then turned to the bladder neck which was divided anteriorly thereby allowing entry into the bladder and exposure of the urethral catheter. The catheter balloon was deflated and the catheter was brought into the operative field and used to retract the prostate anteriorly. The posterior bladder neck was then examined and was divided allowing further dissection between the bladder and prostate posteriorly until the vasa deferentia and seminal vessels were identified. The vasa deferentia were isolated, divided, and lifted anteriorly. The seminal vesicles were dissected down to their tips  with care to control  the seminal vascular arterial blood supply. These structures were then lifted anteriorly and the space between Denonvillier's fascia and the anterior rectum was developed with a combination of sharp and blunt dissection. This isolated the vascular pedicles of the prostate.  The lateral prostatic fascia on the right side of the prostate was then sharply incised allowing release of the neurovascular bundle. The vascular pedicle of the prostate on the right side was then ligated with Weck clips between the prostate and neurovascular bundle and divided with sharp cold scissor dissection resulting in neurovascular bundle preservation. On the left side, a wide non nerve sparing dissection was performed with Weck clips used to ligate the vascular pedicle of the prostate. The neurovascular bundle on the right side was then separated off the apex of the prostate and urethra.  The urethra was then sharply transected allowing the prostate specimen to be disarticulated. The pelvis was copiously irrigated and hemostasis was ensured. There was no evidence for rectal injury.  Attention then turned to the right pelvic sidewall. The fibrofatty tissue between the external iliac vein, confluence of the iliac vessels, hypogastric artery, and Cooper's ligament was dissected free from the pelvic sidewall with care to preserve the obturator nerve. Weck clips were used for lymphostasis and hemostasis. An identical procedure was performed on the contralateral side and the lymphatic packets were removed for permanent pathologic analysis.  Attention then turned to the urethral anastomosis. A 2-0 Vicryl slip knot was placed between Denonvillier's fascia, the posterior bladder neck, and the posterior urethra to reapproximate these structures. A double-armed 3-0 Monocryl suture was then used to perform a 360 running tension-free anastomosis between the bladder neck and urethra. A new urethral catheter was then placed into the bladder  and irrigated. There were no blood clots within the bladder and the anastomosis appeared to be watertight. A #19 Blake drain was then brought through the left lateral 8 mm port site and positioned appropriately within the pelvis. It was secured to the skin with a nylon suture. The surgical cart was then undocked. The right lateral 12 mm port site was closed at the fascial level with a 0 Vicryl suture placed laparoscopically. All remaining ports were then removed under direct vision. The prostate specimen was removed intact within the Endopouch retrieval bag via the periumbilical camera port site. This fascial opening was closed with two running 0 Vicryl sutures. 0.25% Marcaine was then injected into all port sites and all incisions were reapproximated at the skin level with 4-0 Monocryl subcuticular sutures. Dermabond was applied. The patient appeared to tolerate the procedure well and without complications. The patient was able to be extubated and transferred to the recovery unit in satisfactory condition.   Pryor Curia MD

## 2020-09-10 NOTE — Transfer of Care (Signed)
Immediate Anesthesia Transfer of Care Note  Patient: Lonnie Palmer  Procedure(s) Performed: Procedure(s): XI ROBOTIC ASSISTED LAPAROSCOPIC RADICAL PROSTATECTOMY LEVEL 2 (N/A) LYMPHADENECTOMY, PELVIC (Bilateral)  Patient Location: PACU  Anesthesia Type:General  Level of Consciousness:  sedated, patient cooperative and responds to stimulation  Airway & Oxygen Therapy:Patient Spontanous Breathing and Patient connected to face mask oxgen  Post-op Assessment:  Report given to PACU RN and Post -op Vital signs reviewed and stable  Post vital signs:  Reviewed and stable  Last Vitals:  Vitals:   09/10/20 0555  BP: 132/88  Pulse: 83  Resp: 18  Temp: 36.8 C  SpO2: 46%    Complications: No apparent anesthesia complications

## 2020-09-10 NOTE — Anesthesia Procedure Notes (Signed)
Procedure Name: Intubation Date/Time: 09/10/2020 7:57 AM Performed by: Lavina Hamman, CRNA Pre-anesthesia Checklist: Patient identified, Emergency Drugs available, Suction available, Patient being monitored and Timeout performed Patient Re-evaluated:Patient Re-evaluated prior to induction Oxygen Delivery Method: Circle system utilized Preoxygenation: Pre-oxygenation with 100% oxygen Induction Type: IV induction Ventilation: Mask ventilation without difficulty Laryngoscope Size: 4 and Glidescope Grade View: Grade I Tube type: Oral Tube size: 7.0 mm Number of attempts: 1 Airway Equipment and Method: Stylet Placement Confirmation: ETT inserted through vocal cords under direct vision, positive ETCO2, CO2 detector and breath sounds checked- equal and bilateral Secured at: 22 cm Tube secured with: Tape Dental Injury: Teeth and Oropharynx as per pre-operative assessment  Difficulty Due To: Difficulty was anticipated, Difficult Airway- due to reduced neck mobility, Difficult Airway- due to limited oral opening and Difficult Airway- due to dentition Comments: ATOI.  Head and neck confirmed as comfortable position prior to induction.   Small mouth opening, easy mask, G1 view Glidescope 4.

## 2020-09-10 NOTE — Anesthesia Postprocedure Evaluation (Signed)
Anesthesia Post Note  Patient: Lonnie Palmer  Procedure(s) Performed: XI ROBOTIC ASSISTED LAPAROSCOPIC RADICAL PROSTATECTOMY LEVEL 2 (Prostate) LYMPHADENECTOMY, PELVIC (Bilateral: Abdomen)     Patient location during evaluation: PACU Anesthesia Type: General Level of consciousness: awake and alert, oriented and patient cooperative Pain management: pain level controlled Vital Signs Assessment: post-procedure vital signs reviewed and stable Respiratory status: spontaneous breathing, nonlabored ventilation and respiratory function stable Cardiovascular status: blood pressure returned to baseline and stable Postop Assessment: no apparent nausea or vomiting Anesthetic complications: no   No notable events documented.  Last Vitals:  Vitals:   09/10/20 1030 09/10/20 1045  BP: 131/81 (!) 141/75  Pulse: 73 81  Resp: 15 10  Temp:    SpO2: 100% 100%    Last Pain:  Vitals:   09/10/20 1045  TempSrc:   PainSc: [name]

## 2020-09-11 ENCOUNTER — Encounter (HOSPITAL_COMMUNITY): Payer: Self-pay | Admitting: Urology

## 2020-09-11 DIAGNOSIS — C61 Malignant neoplasm of prostate: Secondary | ICD-10-CM | POA: Diagnosis not present

## 2020-09-11 LAB — HEMOGLOBIN AND HEMATOCRIT, BLOOD
HCT: 39.6 % (ref 39.0–52.0)
HCT: 40.4 % (ref 39.0–52.0)
Hemoglobin: 13.2 g/dL (ref 13.0–17.0)
Hemoglobin: 13.3 g/dL (ref 13.0–17.0)

## 2020-09-11 MED ORDER — BISACODYL 10 MG RE SUPP
10.0000 mg | Freq: Once | RECTAL | Status: AC
  Administered 2020-09-11: 10 mg via RECTAL
  Filled 2020-09-11: qty 1

## 2020-09-11 NOTE — Discharge Summary (Signed)
  Date of admission: 09/10/2020  Date of discharge: 09/12/2020  Admission diagnosis: Prostate Cancer  Discharge diagnosis: Prostate Cancer  History and Physical: For full details, please see admission history and physical. Briefly, Lonnie Palmer is a 65 y.o. gentleman with localized prostate cancer.  After discussing management/treatment options, he elected to proceed with surgical treatment.  Hospital Course: Lonnie Palmer was taken to the operating room on 09/10/2020 and underwent a robotic assisted laparoscopic radical prostatectomy. He tolerated this procedure well and without complications. Postoperatively, he was able to be transferred to a regular hospital room following recovery from anesthesia.  He was able to begin ambulating the night of surgery. He remained hemodynamically stable overnight.  He had excellent urine output with appropriately minimal output from his pelvic drain and his pelvic drain was removed on POD #1.  He was transitioned to oral pain medication, but did continue to have significant nausea on POD # 1.  This gradually improved as he began passing flatus. He tolerated a clear liquid diet, and had met all discharge criteria and was able to be discharged home the morning of POD#2.  Laboratory values:  Recent Labs    09/10/20 1034 09/11/20 0417 09/11/20 1245  HGB 15.2 13.2 13.3  HCT 45.3 39.6 40.4    Disposition: Home  Discharge instruction: He was instructed to be ambulatory but to refrain from heavy lifting, strenuous activity, or driving. He was instructed on urethral catheter care.  Discharge medications:   Allergies as of 09/11/2020       Reactions   Lexapro [escitalopram] Diarrhea        Medication List     STOP taking these medications    Coenzyme Q10 100 MG capsule   Eliquis 5 MG Tabs tablet Generic drug: apixaban   Omega-3 1000 MG Caps   sildenafil 100 MG tablet Commonly known as: VIAGRA   VITRUM 50+ SENIOR MULTI PO        TAKE these medications    cyclobenzaprine 10 MG tablet Commonly known as: FLEXERIL Take 10 mg by mouth at bedtime.   docusate sodium 100 MG capsule Commonly known as: COLACE Take 1 capsule (100 mg total) by mouth 2 (two) times daily.   ezetimibe 10 MG tablet Commonly known as: ZETIA Take 10 mg by mouth at bedtime.   HYDROcodone-acetaminophen 5-325 MG tablet Commonly known as: NORCO/VICODIN Take 1-2 tablets by mouth every 6 (six) hours as needed for moderate pain. What changed: how much to take   lisinopril-hydrochlorothiazide 10-12.5 MG tablet Commonly known as: ZESTORETIC Take 1 tablet by mouth daily.   metoprolol succinate 50 MG 24 hr tablet Commonly known as: TOPROL-XL TAKE 1 TABLET BY MOUTH DAILY   simvastatin 20 MG tablet Commonly known as: ZOCOR Take 20 mg by mouth at bedtime.   sulfamethoxazole-trimethoprim 800-160 MG tablet Commonly known as: BACTRIM DS Take 1 tablet by mouth 2 (two) times daily. Start the day prior to foley removal appointment        Followup: He will followup in 1 week for catheter removal and to discuss his surgical pathology results.

## 2020-09-11 NOTE — Progress Notes (Signed)
Assumed care from previous RN. Agree with previous assessment. Pt resting, watching tv. Will continue to monitor pt.

## 2020-09-11 NOTE — Progress Notes (Signed)
Patient ID: Lonnie Palmer, male   DOB: May 31, 1955, 65 y.o.   MRN: [id number]  1 Day Post-Op Subjective: The patient is doing well.  No nausea or vomiting. Pain is adequately controlled.  Objective: Vital signs in last 24 hours: Temp:  [97.5 F (36.4 C)-98.8 F (37.1 C)] 98.4 F (36.9 C) (07/15 0601) Pulse Rate:  [69-82] 69 (07/15 0601) Resp:  [10-18] 18 (07/15 0601) BP: (100-159)/(47-94) 120/64 (07/15 0601) SpO2:  [98 %-100 %] 100 % (07/15 0601) Weight:  [79.3 kg] 79.3 kg (07/14 1243)  Intake/Output from previous day: 07/14 0701 - 07/15 0700 In: 4948.6 [P.O.:480; I.V.:3418.6; IV Piggyback:1050] Out: 2996 [Urine:2700; Drains:146; Blood:150] Intake/Output this shift: No intake/output data recorded.  Physical Exam:  General: Alert and oriented. CV: RRR Lungs: Clear bilaterally. GI: Soft, Nondistended. Incisions: Clean, dry, and intact Urine: Clear Extremities: Nontender, no erythema, no edema.  Lab Results: Recent Labs    09/10/20 1034 09/11/20 0417  HGB 15.2 13.2  HCT 45.3 39.6      Assessment/Plan: POD# 1 s/p robotic prostatectomy.  1) SL IVF 2) Ambulate, Incentive spirometry 3) Transition to oral pain medication 4) Dulcolax suppository 5) D/C pelvic drain 6) Plan for likely discharge later today   Lonnie Palmer. MD   LOS: 0 days   Lonnie Palmer 09/11/2020, 7:41 AM

## 2020-09-11 NOTE — Plan of Care (Signed)
  Problem: Nutrition: Goal: Adequate nutrition will be maintained Outcome: Progressing   Problem: Nutrition: Goal: Adequate nutrition will be maintained Outcome: Progressing

## 2020-09-11 NOTE — Progress Notes (Signed)
Patient ID: Lonnie Palmer, male   DOB: [birth year], 66 y.o.   MRN: [id number]   Pt had nausea early today postponing his discharge.  Currently, he is feeling better but still with only modest po intake.  Now passing flatus.  Will plan to observe overnight and discharge in the AM tomorrow.

## 2020-09-12 DIAGNOSIS — C61 Malignant neoplasm of prostate: Secondary | ICD-10-CM | POA: Diagnosis not present

## 2020-09-12 NOTE — Progress Notes (Signed)
Urology Inpatient Progress Report     Intv/Subj: Patient is feeling much improved this morning.  He had small bowel movement overnight.  He is feeling much more confident about going home today.   Active Problems:   Prostate cancer ([place])  Current Facility-Administered Medications  Medication Dose Route Frequency Provider Last Rate Last Admin   acetaminophen (TYLENOL) tablet 650 mg  650 mg Oral Q4H PRN Raynelle Bring, MD       belladonna-opium (B&O) suppository 16.2-60mg   1 suppository Rectal Q6H PRN Raynelle Bring, MD       Chlorhexidine Gluconate Cloth 2 % PADS 6 each  6 each Topical Daily Raynelle Bring, MD   6 each at 09/11/20 0936   cyclobenzaprine (FLEXERIL) tablet 10 mg  10 mg Oral Melonie Florida, MD   10 mg at 09/11/20 2104   diphenhydrAMINE (BENADRYL) injection 12.5-25 mg  12.5-25 mg Intravenous Q6H PRN Raynelle Bring, MD       Or   diphenhydrAMINE (BENADRYL) 12.5 MG/5ML elixir 12.5-25 mg  12.5-25 mg Oral Q6H PRN Raynelle Bring, MD       docusate sodium (COLACE) capsule 100 mg  100 mg Oral BID Raynelle Bring, MD   100 mg at 09/11/20 2104   ezetimibe (ZETIA) tablet 10 mg  10 mg Oral Melonie Florida, MD   10 mg at 09/11/20 2104   lisinopril (ZESTRIL) tablet 10 mg  10 mg Oral Daily Raynelle Bring, MD   10 mg at 09/11/20 0815   And   hydrochlorothiazide (MICROZIDE) capsule 12.5 mg  12.5 mg Oral Daily Raynelle Bring, MD   12.5 mg at 09/11/20 0814   HYDROcodone-acetaminophen (NORCO/VICODIN) 5-325 MG per tablet 1 tablet  1 tablet Oral Q4H PRN Raynelle Bring, MD   1 tablet at 09/11/20 2104   metoprolol succinate (TOPROL-XL) 24 hr tablet 50 mg  50 mg Oral Daily Raynelle Bring, MD   50 mg at 09/11/20 0254   neomycin-bacitracin-polymyxin (NEOSPORIN) ointment packet 1 application  1 application Topical TID PRN Raynelle Bring, MD       ondansetron [facility]) injection 4 mg  4 mg Intravenous Q4H PRN Raynelle Bring, MD   4 mg at 09/11/20 1634   simvastatin (ZOCOR) tablet 20 mg  20 mg Oral  QHS Raynelle Bring, MD   20 mg at 09/11/20 2104   zolpidem (AMBIEN) tablet 5 mg  5 mg Oral QHS PRN,MR X 1 Raynelle Bring, MD         Objective: Vital: Vitals:   09/11/20 0601 09/11/20 1252 09/11/20 2011 09/12/20 0605  BP: 120/64 121/76 128/70 (!) 103/56  Pulse: 69 60 60 65  Resp: 18 19 16 16   Temp: 98.4 F (36.9 C) (!) 97.5 F (36.4 C) 98.5 F (36.9 C) 98.3 F (36.8 C)  TempSrc: Oral Oral Oral Oral  SpO2: 100% 100% 100% 96%  Weight:      Height:       I/Os: I/O last 3 completed shifts: In: 5428.6 [P.O.:960; I.V.:3418.6; IV Piggyback:1050] Out: 2706 [Urine:3700; Drains:146; Blood:150]  Physical Exam:  General: Patient is in no apparent distress; sitting on side of bed eating breakfast Lungs: Normal respiratory effort, chest expands symmetrically. GI: Incisions are c/d/i.  The abdomen is soft and nontender without mass. JP site dressing c/d/I, JP has been removed Foley: draining clear yellow urine  Ext: lower extremities symmetric  Lab Results: Recent Labs    09/10/20 1034 09/11/20 0417 09/11/20 1245  HGB 15.2 13.2 13.3  HCT 45.3 39.6 40.4  No results for input(s): NA, K, CL, CO2, GLUCOSE, BUN, CREATININE, CALCIUM in the last 72 hours. Recent Labs    09/10/20 0615  INR 1.0   No results for input(s): LABURIN in the last 72 hours. Results for orders placed or performed during the hospital encounter of 09/08/20  SARS CORONAVIRUS 2 (TAT 6-24 HRS) Nasopharyngeal Nasopharyngeal Swab     Status: None   Collection Time: 09/08/20 11:28 AM   Specimen: Nasopharyngeal Swab  Result Value Ref Range Status   SARS Coronavirus 2 NEGATIVE NEGATIVE Final    Comment: (NOTE) SARS-CoV-2 target nucleic acids are NOT DETECTED.  The SARS-CoV-2 RNA is generally detectable in upper and lower respiratory specimens during the acute phase of infection. Negative results do not preclude SARS-CoV-2 infection, do not rule out co-infections with other pathogens, and should not be used as  the sole basis for treatment or other patient management decisions. Negative results must be combined with clinical observations, patient history, and epidemiological information. The expected result is Negative.  Fact Sheet for Patients: SugarRoll.be  Fact Sheet for Healthcare Providers: https://www.woods-mathews.com/  This test is not yet approved or cleared by the [place] FDA and  has been authorized for detection and/or diagnosis of SARS-CoV-2 by FDA under an Emergency Use Authorization (EUA). This EUA will remain  in effect (meaning this test can be used) for the duration of the COVID-19 declaration under Se ction 564(b)(1) of the Act, 21 U.S.C. section 360bbb-3(b)(1), unless the authorization is terminated or revoked sooner.  Performed at [facility], [place] [address], [place], [place] [address]     Studies/Results: No results found.  Assessment: 65 year old man POD2# s/p robotic prostatectomy.  -  Tolerating p.o. intake -  + Flatus and ambulation -   pain control -   d/c home today with foley    Jacalyn Lefevre, MD Urology 09/12/2020, 6:52 AM

## 2020-09-14 ENCOUNTER — Ambulatory Visit (INDEPENDENT_AMBULATORY_CARE_PROVIDER_SITE_OTHER): Payer: 59

## 2020-09-14 DIAGNOSIS — I480 Paroxysmal atrial fibrillation: Secondary | ICD-10-CM

## 2020-09-15 LAB — CUP PACEART REMOTE DEVICE CHECK
Date Time Interrogation Session: 20220717155307
Implantable Pulse Generator Implant Date: 20210923

## 2020-09-16 LAB — SURGICAL PATHOLOGY

## 2020-10-07 NOTE — Progress Notes (Signed)
Carelink Summary Report / Loop Recorder 

## 2020-10-19 ENCOUNTER — Ambulatory Visit (INDEPENDENT_AMBULATORY_CARE_PROVIDER_SITE_OTHER): Payer: 59

## 2020-10-19 DIAGNOSIS — I480 Paroxysmal atrial fibrillation: Secondary | ICD-10-CM

## 2020-10-20 LAB — CUP PACEART REMOTE DEVICE CHECK
Date Time Interrogation Session: 20220819155437
Implantable Pulse Generator Implant Date: 20210923

## 2020-11-04 NOTE — Progress Notes (Signed)
Carelink Summary Report / Loop Recorder 

## 2020-11-23 ENCOUNTER — Ambulatory Visit (INDEPENDENT_AMBULATORY_CARE_PROVIDER_SITE_OTHER): Payer: 59

## 2020-11-23 DIAGNOSIS — I480 Paroxysmal atrial fibrillation: Secondary | ICD-10-CM | POA: Diagnosis not present

## 2020-11-24 LAB — CUP PACEART REMOTE DEVICE CHECK
Date Time Interrogation Session: 20220927081241
Implantable Pulse Generator Implant Date: 20210923

## 2020-12-01 NOTE — Progress Notes (Signed)
Carelink Summary Report / Loop Recorder 

## 2020-12-16 NOTE — Progress Notes (Signed)
Cardiology Office Note:    Date:  12/17/2020  NAME:  Lonnie Palmer    MRN: [id number] DOB:  [birth year]   PCP:  Donald Prose, MD  Cardiologist:  Evalina Field, MD  Electrophysiologist:  Vickie Epley, MD   Referring MD: Donald Prose, MD   Chief Complaint  Patient presents with   Follow-up    History of Present Illness:    Lonnie Palmer is a 65 y.o. male with a hx of AF/AFL s/p ablation, CAD, HTN, HLD who presents for follow-up.  He reports he is doing well.  Most recent LDL cholesterol is at goal.  He is on simvastatin and Zetia.  Labs were reviewed in the office.  Everything looks great.  He denies any chest pain symptoms.  He is exercising.  He recently underwent prostate surgery.  Not have prostate cancer.  He is status post prostatectomy.  Seems to be recovering from all this.  EKG shows sinus rhythm.  No evidence of A. fib or flutter on recent monitor interrogation.  Overall doing well.  All of his risk factors are at goal.  Problem List 1. HTN 2. HLD -Total cholesterol 122, HDL 54, LDL 44, triglycerides 144 3. PACs -23.3% PAC burden 4.  Atrial flutter/fibrillation  -11/15/2019 -CHADSVASC=2 (HTN, CAD) -s/p AFL/AF ablation 01/03/2020 5. CAD -CAC score 385 (82nd percentile)  Past Medical History: Past Medical History:  Diagnosis Date   Atrial fibrillation ([place])    Cancer ([place])    prostate   Coronary artery disease    Hyperlipidemia    Hypertension     Past Surgical History: Past Surgical History:  Procedure Laterality Date   APPENDECTOMY     ATRIAL FIBRILLATION ABLATION N/A 01/03/2020   Procedure: ATRIAL FIBRILLATION ABLATION;  Surgeon: Vickie Epley, MD;  Location: [facility];  Service: Cardiovascular;  Laterality: N/A;   HERNIA REPAIR     LOOP RECORDER INSERTION N/A 11/21/2019   Procedure: LOOP RECORDER INSERTION;  Surgeon: Vickie Epley, MD;  Location: [facility];  Service: Cardiovascular;  Laterality: N/A;   LUMBAR  DISC SURGERY     LYMPHADENECTOMY Bilateral 09/10/2020   Procedure: LYMPHADENECTOMY, PELVIC;  Surgeon: Raynelle Bring, MD;  Location: [facility];  Service: Urology;  Laterality: Bilateral;   ROBOT ASSISTED LAPAROSCOPIC RADICAL PROSTATECTOMY N/A 09/10/2020   Procedure: XI ROBOTIC ASSISTED LAPAROSCOPIC RADICAL PROSTATECTOMY LEVEL 2;  Surgeon: Raynelle Bring, MD;  Location: [facility];  Service: Urology;  Laterality: N/A;   VASECTOMY     WISDOM TOOTH EXTRACTION      Current Medications: Current Meds  Medication Sig   apixaban (ELIQUIS) 5 MG TABS tablet Take 5 mg by mouth 2 (two) times daily.   aspirin EC 81 MG tablet Take 1 tablet (81 mg total) by mouth daily. Swallow whole.   Coenzyme Q10 50 MG CAPS    cyclobenzaprine (FLEXERIL) 10 MG tablet Take 10 mg by mouth at bedtime.    docusate sodium (COLACE) 100 MG capsule Take 1 capsule (100 mg total) by mouth 2 (two) times daily.   ezetimibe (ZETIA) 10 MG tablet Take 10 mg by mouth at bedtime.    HYDROcodone-acetaminophen (NORCO/VICODIN) 5-325 MG tablet Take 1-2 tablets by mouth every 6 (six) hours as needed for moderate pain.   lisinopril-hydrochlorothiazide (ZESTORETIC) 10-12.5 MG tablet Take 1 tablet by mouth daily.   metoprolol succinate (TOPROL-XL) 50 MG 24 hr tablet TAKE 1 TABLET BY MOUTH DAILY   simvastatin (ZOCOR) 20 MG tablet Take  20 mg by mouth at bedtime.    sulfamethoxazole-trimethoprim (BACTRIM DS) 800-160 MG tablet Take 1 tablet by mouth 2 (two) times daily. Start the day prior to foley removal appointment   [DISCONTINUED] Omega 3-6-9 CAPS      Allergies:    Lexapro [escitalopram]   Social History: Social History   Socioeconomic History   Marital status: Married    Spouse name: Not on file   Number of children: 1   Years of education: Not on file   Highest education level: Not on file  Occupational History   Occupation: Contractor  Tobacco Use   Smoking status: Former    Packs/day: 0.50    Years: 6.00    Pack  years: 3.00    Types: Cigarettes    Quit date: 1987    Years since quitting: 35.8   Smokeless tobacco: Never  Vaping Use   Vaping Use: Never used  Substance and Sexual Activity   Alcohol use: Never   Drug use: Never   Sexual activity: Not on file  Other Topics Concern   Not on file  Social History Narrative   Not on file   Social Determinants of Health   Financial Resource Strain: Not on file  Food Insecurity: Not on file  Transportation Needs: Not on file  Physical Activity: Not on file  Stress: Not on file  Social Connections: Not on file     Family History: The patient's family history includes Heart attack in his maternal grandfather.  ROS:   All other ROS reviewed and negative. Pertinent positives noted in the HPI.    EKGs/Labs/Other Studies Reviewed:   The following studies were personally reviewed by me today:  EKG:  EKG is ordered today.  The ekg ordered today demonstrates normal sinus rhythm, incomplete right bundle branch block, and was personally reviewed by me.  TTE 09/12/2019  1. Left ventricular ejection fraction, by estimation, is 60 to 65%. The  left ventricle has normal function. The left ventricle has no regional  wall motion abnormalities. There is mild left ventricular hypertrophy.  Left ventricular diastolic parameters  are consistent with Grade I diastolic dysfunction (impaired relaxation).   2. Right ventricular systolic function is normal. The right ventricular  size is normal.   3. The mitral valve is grossly normal. Trivial mitral valve  regurgitation.   4. The aortic valve is tricuspid. Aortic valve regurgitation is not  visualized.   5. The inferior vena cava is normal in size with greater than 50%  respiratory variability, suggesting right atrial pressure of 3 mmHg.    Recent Labs: 09/02/2020: BUN 15; Creatinine, Ser 0.98; Platelets 222; Potassium 4.6; Sodium 139 09/11/2020: Hemoglobin 13.3   Recent Lipid Panel No results found for:  CHOL, TRIG, HDL, CHOLHDL, VLDL, LDLCALC, LDLDIRECT  Physical Exam:   VS:  BP 128/64   Ht 6\' 1"  (1.854 m)   Wt 175 lb 3.2 oz (79.5 kg)   BMI 23.11 kg/m    Wt Readings from Last 3 Encounters:  12/17/20 175 lb 3.2 oz (79.5 kg)  09/10/20 174 lb 12.8 oz (79.3 kg)  09/02/20 174 lb 12.8 oz (79.3 kg)    General: Well nourished, well developed, in no acute distress Head: Atraumatic, normal size  Eyes: PEERLA, EOMI  Neck: Supple, no JVD Endocrine: No thryomegaly Cardiac: Normal S1, S2; RRR; no murmurs, rubs, or gallops Lungs: Clear to auscultation bilaterally, no wheezing, rhonchi or rales  Abd: Soft, nontender, no hepatomegaly  Ext:  No edema, pulses 2+ Musculoskeletal: No deformities, BUE and BLE strength normal and equal Skin: Warm and dry, no rashes   Neuro: Alert and oriented to person, place, time, and situation, CNII-XII grossly intact, no focal deficits  Psych: Normal mood and affect   ASSESSMENT:   Lonnie Palmer is a 65 y.o. male who presents for the following: 1. Paroxysmal atrial fibrillation (HCC)   2. Typical atrial flutter ([place])   3. Coronary artery disease involving native coronary artery of native heart without angina pectoris   4. Agatston coronary artery calcium score between 100 and 400   5. Mixed hyperlipidemia     PLAN:   1. Paroxysmal atrial fibrillation (HCC) 2. Typical atrial flutter (HCC) -Status post atrial fibrillation and atrial flutter ablation.  Doing well since that time.  No further recurrence.  CHA2DS2-VASc score is 2.  He will continue Eliquis 5 mg twice daily moving forward.  Echo was normal.  Seems to be doing well.  No further recurrence.  3. Coronary artery disease involving native coronary artery of native heart without angina pectoris 4. Agatston coronary artery calcium score between 100 and 400 5. Mixed hyperlipidemia -Elevated calcium score in the 82nd percentile.  No symptoms of angina.  I recommend starting aspirin 81 mg daily regimen.   His most recent LDL cholesterol was 44.  He takes simvastatin 20 mg daily as well as Zetia 10 mg daily.  Would recommend he continue this regimen.  LDL cholesterol is at goal.  I recommended regular exercise.  EKG continues to remain unchanged.  He overall is doing well.  Disposition: Return in about 1 year (around 12/17/2021).  Medication Adjustments/Labs and Tests Ordered: Current medicines are reviewed at length with the patient today.  Concerns regarding medicines are outlined above.  No orders of the defined types were placed in this encounter.  Meds ordered this encounter  Medications   aspirin EC 81 MG tablet    Sig: Take 1 tablet (81 mg total) by mouth daily. Swallow whole.    Dispense:  90 tablet    Refill:  3     Patient Instructions  Medication Instructions:  Stop Omega 3 Start Aspirin 81 mg daily (this is over the counter)   *If you need a refill on your cardiac medications before your next appointment, please call your pharmacy*   Follow-Up: At [facility], you and your health needs are our priority.  As part of our continuing mission to provide you with exceptional heart care, we have created designated Provider Care Teams.  These Care Teams include your primary Cardiologist (physician) and Advanced Practice Providers (APPs -  Physician Assistants and Nurse Practitioners) who all work together to provide you with the care you need, when you need it.  We recommend signing up for the patient portal called "MyChart".  Sign up information is provided on this After Visit Summary.  MyChart is used to connect with patients for Virtual Visits (Telemedicine).  Patients are able to view lab/test results, encounter notes, upcoming appointments, etc.  Non-urgent messages can be sent to your provider as well.   To learn more about what you can do with MyChart, go to [email or web address].    Your next appointment:   12 month(s)  The format for your next appointment:   In  Person  Provider:   Eleonore Chiquito, MD      Time Spent with Patient: I have spent a total of 35 minutes with patient reviewing hospital notes, telemetry, EKGs,  labs and examining the patient as well as establishing an assessment and plan that was discussed with the patient.  > 50% of time was spent in direct patient care.  Signed, Addison Naegeli. Audie Box, MD, [place]  [address] [place], [place] [address] [phone]  12/17/2020 9:14 AM

## 2020-12-17 ENCOUNTER — Ambulatory Visit (INDEPENDENT_AMBULATORY_CARE_PROVIDER_SITE_OTHER): Payer: 59 | Admitting: Cardiovascular Disease

## 2020-12-17 ENCOUNTER — Other Ambulatory Visit: Payer: Self-pay

## 2020-12-17 ENCOUNTER — Encounter (HOSPITAL_BASED_OUTPATIENT_CLINIC_OR_DEPARTMENT_OTHER): Payer: Self-pay | Admitting: Cardiovascular Disease

## 2020-12-17 VITALS — BP 128/64 | Ht 73.0 in | Wt 175.2 lb

## 2020-12-17 DIAGNOSIS — E782 Mixed hyperlipidemia: Secondary | ICD-10-CM

## 2020-12-17 DIAGNOSIS — R931 Abnormal findings on diagnostic imaging of heart and coronary circulation: Secondary | ICD-10-CM

## 2020-12-17 DIAGNOSIS — I480 Paroxysmal atrial fibrillation: Secondary | ICD-10-CM

## 2020-12-17 DIAGNOSIS — I483 Typical atrial flutter: Secondary | ICD-10-CM | POA: Diagnosis not present

## 2020-12-17 DIAGNOSIS — I2510 Atherosclerotic heart disease of native coronary artery without angina pectoris: Secondary | ICD-10-CM | POA: Diagnosis not present

## 2020-12-17 MED ORDER — ASPIRIN EC 81 MG PO TBEC: 81.0000 mg | DELAYED_RELEASE_TABLET | Freq: Every day | ORAL | 3 refills | Status: AC

## 2020-12-17 NOTE — Addendum Note (Signed)
Addended by: Ernie Hew D on: 12/17/2020 03:51 PM   Modules accepted: Orders

## 2020-12-17 NOTE — Patient Instructions (Signed)
Medication Instructions:  Stop Omega 3 Start Aspirin 81 mg daily (this is over the counter)   *If you need a refill on your cardiac medications before your next appointment, please call your pharmacy*   Follow-Up: At [facility], you and your health needs are our priority.  As part of our continuing mission to provide you with exceptional heart care, we have created designated Provider Care Teams.  These Care Teams include your primary Cardiologist (physician) and Advanced Practice Providers (APPs -  Physician Assistants and Nurse Practitioners) who all work together to provide you with the care you need, when you need it.  We recommend signing up for the patient portal called "MyChart".  Sign up information is provided on this After Visit Summary.  MyChart is used to connect with patients for Virtual Visits (Telemedicine).  Patients are able to view lab/test results, encounter notes, upcoming appointments, etc.  Non-urgent messages can be sent to your provider as well.   To learn more about what you can do with MyChart, go to [email or web address].    Your next appointment:   12 month(s)  The format for your next appointment:   In Person  Provider:   Eleonore Chiquito, MD

## 2020-12-18 ENCOUNTER — Encounter: Payer: Self-pay | Admitting: Urology

## 2020-12-22 LAB — CUP PACEART REMOTE DEVICE CHECK
Date Time Interrogation Session: 20221024155351
Implantable Pulse Generator Implant Date: 20210923

## 2020-12-28 ENCOUNTER — Ambulatory Visit (INDEPENDENT_AMBULATORY_CARE_PROVIDER_SITE_OTHER): Payer: 59

## 2020-12-28 DIAGNOSIS — I480 Paroxysmal atrial fibrillation: Secondary | ICD-10-CM

## 2020-12-29 IMAGING — CT CT HEART MORPH/PULM VEIN W/ CM & W/O CA SCORE
2 of 7 series · 11 of 20 positions shown, 13 images · non-contrast
Comparison: None.
COMPARISON: None.

Addendum:
EXAM:
OVER-READ INTERPRETATION  CT CHEST

The following report is an over-read performed by radiologist Dr.
Kubura Uma [REDACTED] on 12/30/2019. This
over-read does not include interpretation of cardiac or coronary
anatomy or pathology. The coronary CTA interpretation by the
cardiologist is attached.
CLINICAL DATA: Atrial fibrillation scheduled for an ablation.
Cardiac CT/CTA
TECHNIQUE: A 120 kV prospective scan was triggered in the ascending thoracic
aorta at 140 HU's. Gantry rotation speed was 250 msecs and
collimation was .6 mm. No beta blockade and no NTG was given. The 3D
data set was reconstructed for best systolic and diastolic phases
along with delayed images of the HENRY ALEX Images analyzed on a dedicated
work station using MPR, MIP and VRT modes. The patient received 80
cc of contrast.

[Series 9: 0-95% · axial · 0.39mm/px · z∈[-224,-128]mm · 5 of 2870 slices shown]
[im 479/2870  vessel]
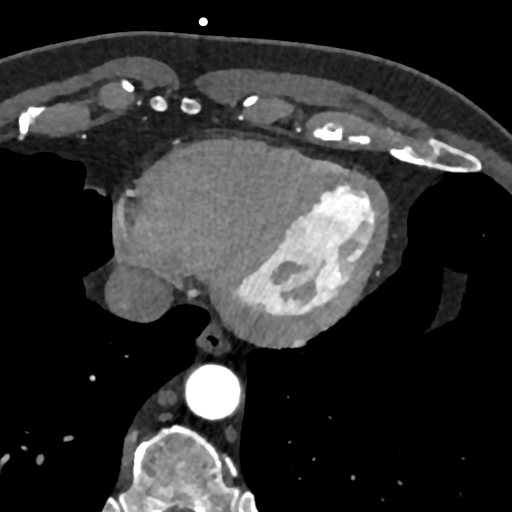
[im 957/2870  vessel]
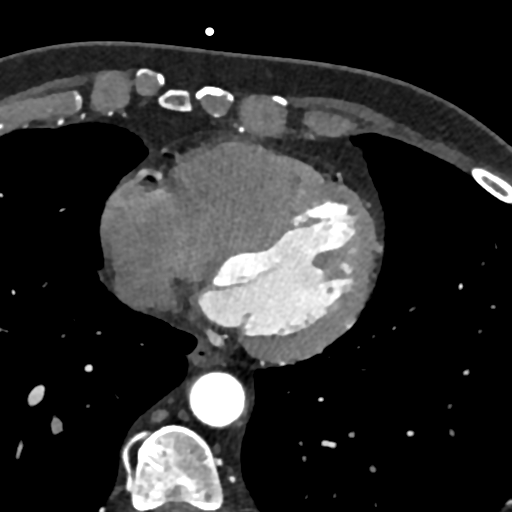
[im 1435/2870  vessel]
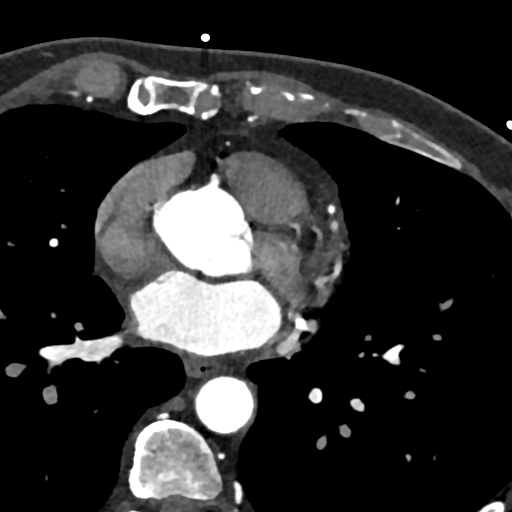
[im 1913/2870  vessel]
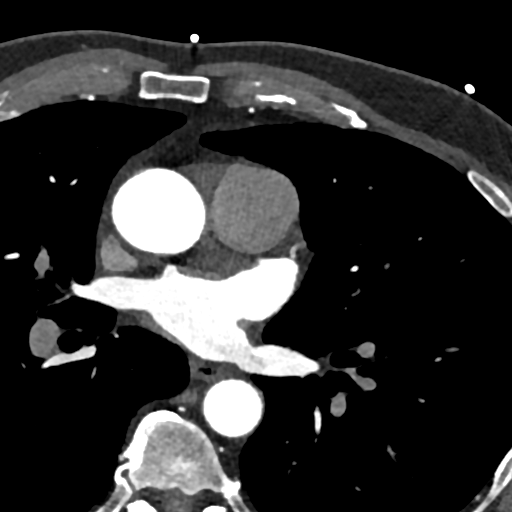
[im 2391/2870  vessel]
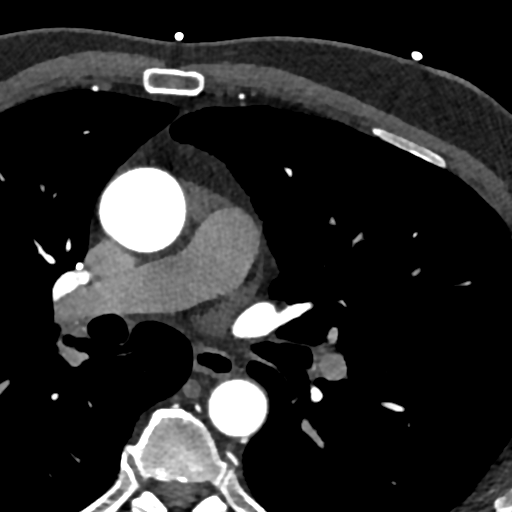

[Series 14: 5-95% · axial · 0.79mm/px · z∈[-227,-125]mm · 6 of 2870 slices shown, 8 images]
[im 410/2870  vessel]
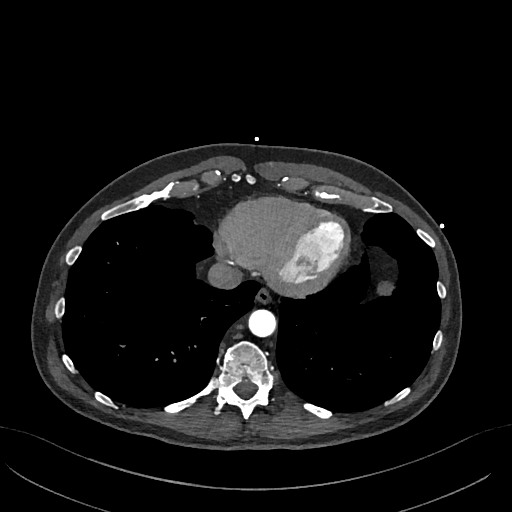
[im 410/2870  lung]
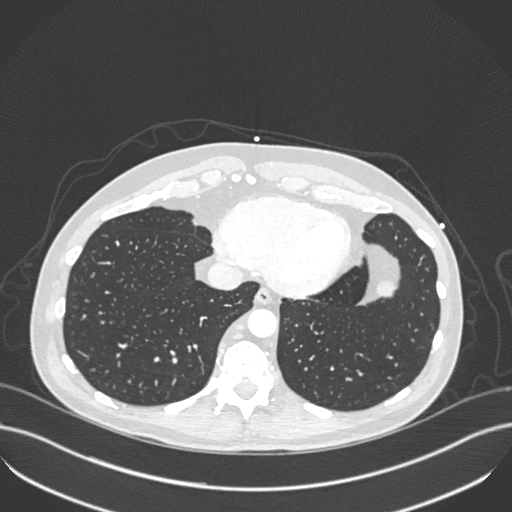
[im 820/2870  vessel]
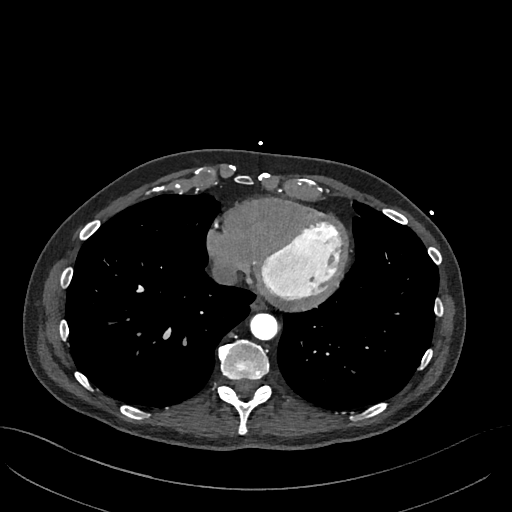
[im 1230/2870  vessel]
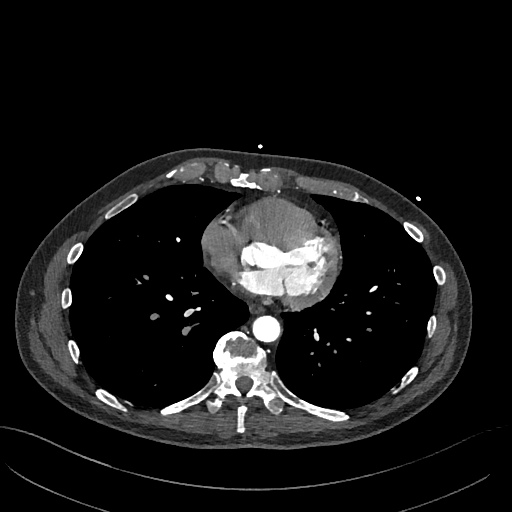
[im 1640/2870  vessel]
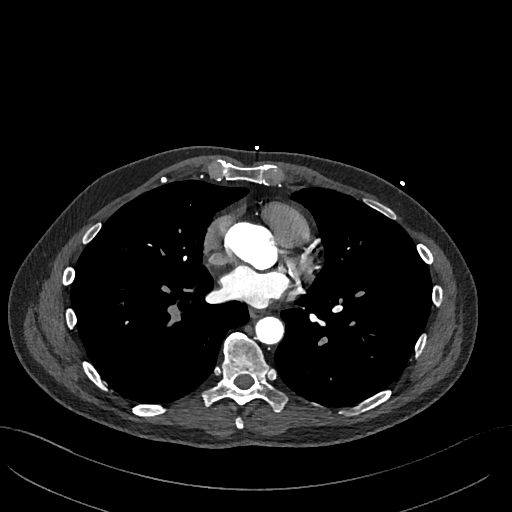
[im 2050/2870  vessel]
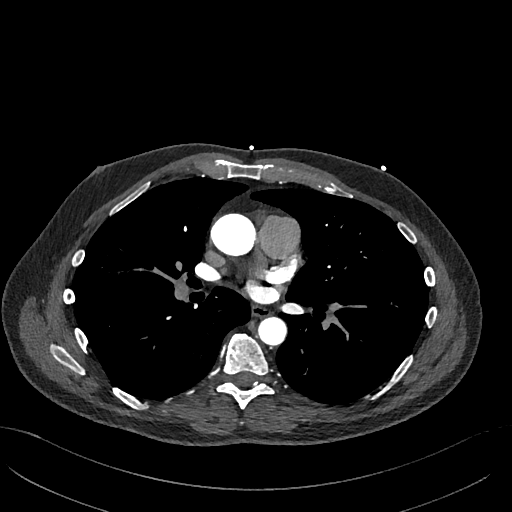
[im 2050/2870  lung]
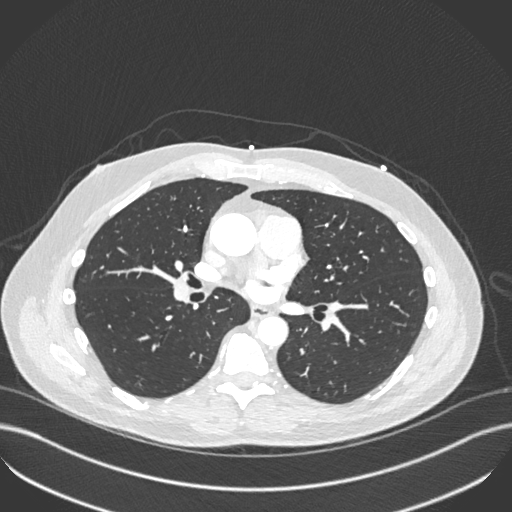
[im 2460/2870  vessel]
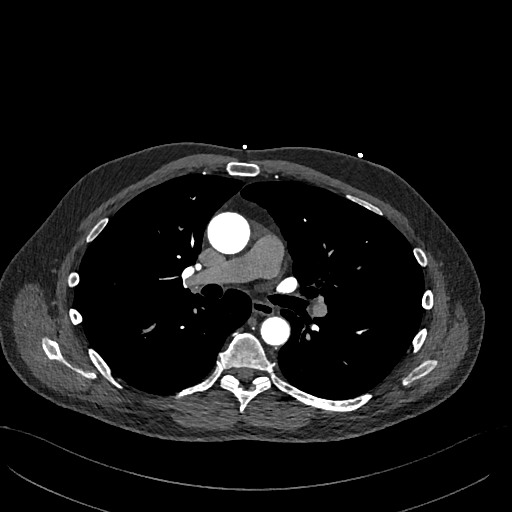

[11 of 20 positions shown; findings below may reference images not displayed]

FINDINGS: Vascular: No significant vascular findings. Normal heart size. No
pericardial effusion.

Mediastinum/Nodes: Visualized mediastinum and hilar regions
demonstrate no lymphadenopathy or masses.

Lungs/Pleura: Visualized lungs show no evidence of pulmonary edema,
consolidation, pneumothorax, nodule or pleural fluid.

Upper Abdomen: No acute abnormality.

Musculoskeletal: No chest wall mass or suspicious bone lesions
identified.
IMPRESSION: No significant incidental findings.
FINDINGS: Image quality: excellent.

Noise artifact is: Limited.

Pulmonary Veins: There is normal pulmonary vein drainage into the
left atrium (2 on the right and 2 on the left) with ostial
measurements as follows:

RUPV: Ostium 19 mm x 12 mm  area 180 mm2

RLPV:  Ostium 18 mm x 12 mm  area 171 mm2

LUPV:  Ostium 19 mm x 14 mm area 213 mm2

LLPV:  Ostium 16 mm x 10 mm  area 125 mm2

Left Atrium: The left atrial size is normal. There is a small PFO.
The left atrial appendage is large windsock morphology with 1 lobe
and ostial size 25.2 mm x 18.6 mm and length 31 mm. There is no
thrombus in the left atrial appendage on contrast or delayed
imaging. The esophagus runs in the left atrial midline and is in
close proximity to the left inferior pulmonary vein ostium.

Coronary Arteries: CAC score of 385, which is 82nd percentile for
age- and sex-matched controls. Normal coronary origin. Right
dominance. The study was performed without use of NTG and is
insufficient for plaque evaluation. The following assessment was
made of the proximal segments:

Left main: The left main is a large caliber vessel with a normal
take off from the left coronary cusp that bifurcates trifurcates
into a LAD, LCX, and ramus intermedius. There is no plaque or
stenosis.

Left anterior descending artery: The proximal LAD contains minimal
calcified plaque (<25%) in the proximal segment.

Ramus intermedius: The proximal segments contains minimal mixed
density plaque (<25%).

Left circumflex artery: The LCX is non-dominant with minimal
calcified plaque in the proximal segment (<25%).

Right coronary artery: The RCA is dominant with normal take off from
the right coronary cusp with no evidence of plaque or stenosis in
the proximal segment.

Right Atrium: Right atrial size is within normal limits.

Right Ventricle: The right ventricular cavity is within normal
limits.

Left Ventricle: The ventricular cavity size is within normal limits.
There are no stigmata of prior infarction. There is no abnormal
filling defect. Normal left ventricular function, LVEF=57%. No
regional wall motion.

Pericardium: Normal thickness with no significant effusion or
calcium present.

Pulmonary Artery: Normal caliber without proximal filling defect.

Cardiac valves: The aortic valve is trileaflet without significant
calcification. The mitral valve is normal structure without
significant calcification.

Aorta: Normal caliber with no significant disease.

Extra-cardiac findings: See attached radiology report for
non-cardiac structures.
IMPRESSION: 1. There is normal pulmonary vein drainage into the left atrium with
ostial measurements above.

2. There is no thrombus in the left atrial appendage.

3. The esophagus runs in the left atrial midline and is in close
proximity to the left inferior pulmonary vein ostium.

4. There is a small PFO.

5. Normal coronary origin. Right dominance.

6. CAC score of 385, which is 82nd percentile for age- and
sex-matched controls.

7. Minimal CAD (<25%) in the proximal LAD/RI/LCX.

*** End of Addendum ***
EXAM:
OVER-READ INTERPRETATION  CT CHEST

The following report is an over-read performed by radiologist Dr.
Kubura Uma [REDACTED] on 12/30/2019. This
over-read does not include interpretation of cardiac or coronary
anatomy or pathology. The coronary CTA interpretation by the
cardiologist is attached.
FINDINGS: Vascular: No significant vascular findings. Normal heart size. No
pericardial effusion.

Mediastinum/Nodes: Visualized mediastinum and hilar regions
demonstrate no lymphadenopathy or masses.

Lungs/Pleura: Visualized lungs show no evidence of pulmonary edema,
consolidation, pneumothorax, nodule or pleural fluid.

Upper Abdomen: No acute abnormality.

Musculoskeletal: No chest wall mass or suspicious bone lesions
identified.
IMPRESSION: No significant incidental findings.

## 2021-01-04 NOTE — Progress Notes (Signed)
Carelink Summary Report / Loop Recorder 

## 2021-01-07 ENCOUNTER — Encounter (HOSPITAL_BASED_OUTPATIENT_CLINIC_OR_DEPARTMENT_OTHER): Payer: Self-pay

## 2021-01-07 MED ORDER — APIXABAN 5 MG PO TABS: 5.0000 mg | ORAL_TABLET | Freq: Two times a day (BID) | ORAL | 3 refills | Status: DC

## 2021-01-07 MED ORDER — METOPROLOL SUCCINATE ER 50 MG PO TB24: 50.0000 mg | ORAL_TABLET | Freq: Every day | ORAL | 3 refills | Status: DC

## 2021-01-12 DIAGNOSIS — M62838 Other muscle spasm: Secondary | ICD-10-CM | POA: Diagnosis not present

## 2021-01-12 DIAGNOSIS — M6281 Muscle weakness (generalized): Secondary | ICD-10-CM | POA: Diagnosis not present

## 2021-01-24 LAB — CUP PACEART REMOTE DEVICE CHECK
Date Time Interrogation Session: 20221126155040
Implantable Pulse Generator Implant Date: 20210923

## 2021-01-25 ENCOUNTER — Encounter: Payer: Self-pay | Admitting: Cardiology

## 2021-02-01 ENCOUNTER — Ambulatory Visit (INDEPENDENT_AMBULATORY_CARE_PROVIDER_SITE_OTHER): Payer: Medicare Other

## 2021-02-01 DIAGNOSIS — I480 Paroxysmal atrial fibrillation: Secondary | ICD-10-CM

## 2021-02-10 NOTE — Progress Notes (Signed)
Carelink Summary Report / Loop Recorder 

## 2021-03-03 ENCOUNTER — Encounter: Payer: Self-pay | Admitting: Cardiology

## 2021-03-03 ENCOUNTER — Ambulatory Visit: Payer: Medicare Other | Admitting: Cardiology

## 2021-03-03 ENCOUNTER — Other Ambulatory Visit: Payer: Self-pay

## 2021-03-03 VITALS — BP 116/62 | HR 71 | Ht 73.0 in | Wt 178.4 lb

## 2021-03-03 DIAGNOSIS — I2510 Atherosclerotic heart disease of native coronary artery without angina pectoris: Secondary | ICD-10-CM

## 2021-03-03 DIAGNOSIS — I10 Essential (primary) hypertension: Secondary | ICD-10-CM

## 2021-03-03 DIAGNOSIS — M6281 Muscle weakness (generalized): Secondary | ICD-10-CM | POA: Diagnosis not present

## 2021-03-03 DIAGNOSIS — I480 Paroxysmal atrial fibrillation: Secondary | ICD-10-CM | POA: Diagnosis not present

## 2021-03-03 DIAGNOSIS — M62838 Other muscle spasm: Secondary | ICD-10-CM | POA: Diagnosis not present

## 2021-03-03 DIAGNOSIS — I483 Typical atrial flutter: Secondary | ICD-10-CM | POA: Diagnosis not present

## 2021-03-03 NOTE — Patient Instructions (Addendum)
Medication Instructions:  Your physician recommends that you continue on your current medications as directed. Please refer to the Current Medication list given to you today. *If you need a refill on your cardiac medications before your next appointment, please call your pharmacy*  Lab Work: None. If you have labs (blood work) drawn today and your tests are completely normal, you will receive your results only by: [facility] (if you have MyChart) OR A paper copy in the mail If you have any lab test that is abnormal or we need to change your treatment, we will call you to review the results.  Testing/Procedures: Your physician has requested that you have an echocardiogram. Echocardiography is a painless test that uses sound waves to create images of your heart. It provides your doctor with information about the size and shape of your heart and how well your hearts chambers and valves are working. This procedure takes approximately one hour. There are no restrictions for this procedure.   Follow-Up: At [facility], you and your health needs are our priority.  As part of our continuing mission to provide you with exceptional heart care, we have created designated Provider Care Teams.  These Care Teams include your primary Cardiologist (physician) and Advanced Practice Providers (APPs -  Physician Assistants and Nurse Practitioners) who all work together to provide you with the care you need, when you need it.  Your physician wants you to follow-up in: Lenice Llamas will be in touch after your tests are reviewed for follow up.   We recommend signing up for the patient portal called "MyChart".  Sign up information is provided on this After Visit Summary.  MyChart is used to connect with patients for Virtual Visits (Telemedicine).  Patients are able to view lab/test results, encounter notes, upcoming appointments, etc.  Non-urgent messages can be sent to your provider as well.   To learn more  about what you can do with MyChart, go to [email or web address].    Any Other Special Instructions Will Be Listed Below (If Applicable).

## 2021-03-03 NOTE — Progress Notes (Signed)
Electrophysiology Office Follow up Visit Note:    Date:  03/03/2021   ID:  Lonnie Palmer, DOB 24-Oct-1955, MRN [id number]  PCP:  Donald Prose, MD  [facility] Cardiologist:  Evalina Field, MD  [place] Electrophysiologist:  Vickie Epley, MD    Interval History:    Lonnie Palmer is a 66 y.o. male who presents for a follow up visit.  He underwent a successful A. fib ablation on January 03, 2020.  He has done well since the ablation without recurrence of his arrhythmia.  We monitor his rhythm using a loop recorder.  He continues to take Eliquis for stroke prophylaxis but is interested in avoiding long-term exposure to anticoagulation if at all possible.  He presents to discuss left atrial appendage occlusion.  He is with his wife today in clinic.  Since I last saw him he is undergone treatment for prostate cancer including a robotic prostatectomy.  His recent scans showed no evidence of disease.       Past Medical History:  Diagnosis Date   Atrial fibrillation ([place])    Cancer [facility])    prostate   Coronary artery disease    Hyperlipidemia    Hypertension     Past Surgical History:  Procedure Laterality Date   APPENDECTOMY     ATRIAL FIBRILLATION ABLATION N/A 01/03/2020   Procedure: ATRIAL FIBRILLATION ABLATION;  Surgeon: Vickie Epley, MD;  Location: [place] [facility];  Service: Cardiovascular;  Laterality: N/A;   HERNIA REPAIR     LOOP RECORDER INSERTION N/A 11/21/2019   Procedure: LOOP RECORDER INSERTION;  Surgeon: Vickie Epley, MD;  Location: [place] [facility];  Service: Cardiovascular;  Laterality: N/A;   LUMBAR DISC SURGERY     LYMPHADENECTOMY Bilateral 09/10/2020   Procedure: LYMPHADENECTOMY, PELVIC;  Surgeon: Raynelle Bring, MD;  Location: [facility];  Service: Urology;  Laterality: Bilateral;   ROBOT ASSISTED LAPAROSCOPIC RADICAL PROSTATECTOMY N/A 09/10/2020   Procedure: XI ROBOTIC ASSISTED LAPAROSCOPIC RADICAL PROSTATECTOMY LEVEL 2;   Surgeon: Raynelle Bring, MD;  Location: [facility];  Service: Urology;  Laterality: N/A;   VASECTOMY     WISDOM TOOTH EXTRACTION      Current Medications: Current Meds  Medication Sig   apixaban (ELIQUIS) 5 MG TABS tablet Take 1 tablet (5 mg total) by mouth 2 (two) times daily.   aspirin EC 81 MG tablet Take 1 tablet (81 mg total) by mouth daily. Swallow whole.   Coenzyme Q10 50 MG CAPS    cyclobenzaprine (FLEXERIL) 10 MG tablet Take 10 mg by mouth at bedtime.    docusate sodium (COLACE) 100 MG capsule Take 1 capsule (100 mg total) by mouth 2 (two) times daily.   ezetimibe (ZETIA) 10 MG tablet Take 10 mg by mouth at bedtime.    HYDROcodone-acetaminophen (NORCO/VICODIN) 5-325 MG tablet Take 1-2 tablets by mouth every 6 (six) hours as needed for moderate pain.   lisinopril-hydrochlorothiazide (ZESTORETIC) 10-12.5 MG tablet Take 1 tablet by mouth daily.   metoprolol succinate (TOPROL-XL) 50 MG 24 hr tablet Take 1 tablet (50 mg total) by mouth daily. Take with or immediately following a meal.   sildenafil (VIAGRA) 100 MG tablet Take by mouth.   simvastatin (ZOCOR) 20 MG tablet Take 20 mg by mouth at bedtime.      Allergies:   Lexapro [escitalopram]   Social History   Socioeconomic History   Marital status: Married    Spouse name: Not on file   Number of children: 1  Years of education: Not on file   Highest education level: Not on file  Occupational History   Occupation: Contractor  Tobacco Use   Smoking status: Former    Packs/day: 0.50    Years: 6.00    Pack years: 3.00    Types: Cigarettes    Quit date: 1987    Years since quitting: 36.0   Smokeless tobacco: Never  Vaping Use   Vaping Use: Never used  Substance and Sexual Activity   Alcohol use: Never   Drug use: Never   Sexual activity: Not on file  Other Topics Concern   Not on file  Social History Narrative   Not on file   Social Determinants of Health   Financial Resource Strain: Not on file  Food  Insecurity: Not on file  Transportation Needs: Not on file  Physical Activity: Not on file  Stress: Not on file  Social Connections: Not on file     Family History: The patient's family history includes Heart attack in his maternal grandfather.  ROS:   Please see the history of present illness.    All other systems reviewed and are negative.  EKGs/Labs/Other Studies Reviewed:    The following studies were reviewed today:  Loop recorder interrogations  December 30, 2019 CT PV protocol reviewed.  Left atrial appendage is large windsock morphology with a ostial size of 25 x 18 and a length of 31 mm.   EKG:  The ekg ordered today demonstrates sinus rhythm, right bundle branch block  Recent Labs: 09/02/2020: BUN 15; Creatinine, Ser 0.98; Platelets 222; Potassium 4.6; Sodium 139 09/11/2020: Hemoglobin 13.3  Recent Lipid Panel No results found for: CHOL, TRIG, HDL, CHOLHDL, VLDL, LDLCALC, LDLDIRECT  Physical Exam:    VS:  BP 116/62    Pulse 71    Ht 6\' 1"  (1.854 m)    Wt 178 lb 6.4 oz (80.9 kg)    SpO2 98%    BMI 23.54 kg/m     Wt Readings from Last 3 Encounters:  03/03/21 178 lb 6.4 oz (80.9 kg)  12/17/20 175 lb 3.2 oz (79.5 kg)  09/10/20 174 lb 12.8 oz (79.3 kg)     GEN:  Well nourished, well developed in no acute distress HEENT: Normal NECK: No JVD; No carotid bruits LYMPHATICS: No lymphadenopathy CARDIAC: RRR, no murmurs, rubs, gallops RESPIRATORY:  Clear to auscultation without rales, wheezing or rhonchi  ABDOMEN: Soft, non-tender, non-distended MUSCULOSKELETAL:  No edema; No deformity  SKIN: Warm and dry NEUROLOGIC:  Alert and oriented x 3 PSYCHIATRIC:  Normal affect        ASSESSMENT:    1. Paroxysmal atrial fibrillation (HCC)   2. Typical atrial flutter ([place])   3. Coronary artery disease involving native coronary artery of native heart without angina pectoris   4. Essential hypertension    PLAN:    In order of problems listed above:  #Paroxysmal  atrial fibrillation and flutter Now post successful ablation on January 03, 2020.  He is maintaining normal rhythm without recurrence on loop recorder monitoring.  He takes Eliquis for stroke prophylaxis and a CHA2DS2-VASc of at least 3.  He is interested in avoiding long-term exposure anticoagulation which I think is reasonable.  We discussed left atrial appendage occlusion during today's appointment.  I do think he would be a good candidate for the procedure.  We discussed the procedure in detail including the risks and recovery.  We discussed how the left atrial appendage occlusion procedure  does not completely eliminate the risk of stroke but does reduce the risk of stroke related to left atrial appendage cardioembolic event.  ----------  I have seen Lonnie Palmer in the office today who is being considered for a Watchman left atrial appendage closure device. I believe they will benefit from this procedure given their history of atrial fibrillation, CHA2DS2-VASc score of 3 and unadjusted ischemic stroke rate of 3.2% per year. The patient's chart has been reviewed and I feel that they would be a candidate for short term oral anticoagulation after Watchman implant.   It is my belief that after undergoing a LAA closure procedure, Lonnie Palmer will not need long term anticoagulation which eliminates anticoagulation side effects and major bleeding risk.   Procedural risks for the Watchman implant have been reviewed with the patient including a 0.5% risk of stroke, <1% risk of perforation and <1% risk of device embolization.    The published clinical data on the safety and effectiveness of WATCHMAN include but are not limited to the following: - Holmes DR, Mechele Claude, Sick P et al. for the PROTECT AF Investigators. Percutaneous closure of the left atrial appendage versus warfarin therapy for prevention of stroke in patients with atrial fibrillation: a randomised non-inferiority trial. Lancet  2009; 374: 534-42. Mechele Claude, Doshi SK, Abelardo Diesel D et al. on behalf of the PROTECT AF Investigators. Percutaneous Left Atrial Appendage Closure for Stroke Prophylaxis in Patients With Atrial Fibrillation 2.3-Year Follow-up of the PROTECT AF (Watchman Left Atrial Appendage System for Embolic Protection in Patients With Atrial Fibrillation) Trial. Circulation 2013; 127:720-729. - Alli O, Doshi S,  Kar S, Reddy VY, Sievert H et al. Quality of Life Assessment in the Randomized PROTECT AF (Percutaneous Closure of the Left Atrial Appendage Versus Warfarin Therapy for Prevention of Stroke in Patients With Atrial Fibrillation) Trial of Patients at Risk for Stroke With Nonvalvular Atrial Fibrillation. J Am Coll Cardiol 2013; 62:8315-1. Vertell Limber DR, Tarri Abernethy, Price M, Goodrich, Sievert H, Doshi S, Huber K, Reddy V. Prospective randomized evaluation of the Watchman left atrial appendage Device in patients with atrial fibrillation versus long-term warfarin therapy; the PREVAIL trial. Journal of the [employer] of Cardiology, Vol. 4, No. 1, 2014, 1-11. - Kar S, Doshi SK, Sadhu A, Horton R, Osorio J et al. Primary outcome evaluation of a next-generation left atrial appendage closure device: results from the PINNACLE FLX trial. Circulation 2021;143(18)1754-1762.    After today's visit with the patient which was dedicated solely for shared decision making visit regarding LAA closure device, the patient decided to proceed with the LAA appendage closure procedure scheduled to be done in the near future at [facility].   HAS-BLED score 2 Hypertension Yes  Abnormal renal and liver function (Dialysis, transplant, Cr >2.26 mg/dL /Cirrhosis or Bilirubin >2x Normal or AST/ALT/AP >3x Normal) No  Stroke No  Bleeding No  Labile INR (Unstable/high INR) No  Elderly (>65) Yes  Drugs or alcohol (? 8 drinks/week, anti-plt or NSAID) No   #Hypertension Controlled Continue current regimen Continue to  monitor blood pressures at home 1-2 times per week  #Coronary artery disease No ischemic symptoms today   Total time spent with patient today 45 minutes. This includes reviewing records, evaluating the patient and coordinating care.   Medication Adjustments/Labs and Tests Ordered: Current medicines are reviewed at length with the patient today.  Concerns regarding medicines are outlined above.  Orders Placed This Encounter  Procedures   EKG  12-Lead   ECHOCARDIOGRAM COMPLETE   No orders of the defined types were placed in this encounter.    Signed, Lars Mage, MD, [facility], [facility] 03/03/2021 8:45 PM    Electrophysiology [facility]

## 2021-03-08 ENCOUNTER — Ambulatory Visit (INDEPENDENT_AMBULATORY_CARE_PROVIDER_SITE_OTHER): Payer: Medicare Other

## 2021-03-08 DIAGNOSIS — I480 Paroxysmal atrial fibrillation: Secondary | ICD-10-CM

## 2021-03-08 LAB — CUP PACEART REMOTE DEVICE CHECK
Date Time Interrogation Session: 20230108230907
Implantable Pulse Generator Implant Date: 20210923

## 2021-03-09 ENCOUNTER — Telehealth: Payer: Self-pay

## 2021-03-09 NOTE — Telephone Encounter (Signed)
Per Dr. Quentin Ore, called patient to discuss Lonnie Palmer.  Will attempt to arrange procedure 04/01/2021. The patient will need shared decision making visit with Dr. Audie Box prior to visit. Tentatively holding appointment 03/24/21.   Per Dr. Quentin Ore, no further imaging is needed.   Left message for patient to call back.

## 2021-03-11 ENCOUNTER — Other Ambulatory Visit: Payer: Self-pay

## 2021-03-11 ENCOUNTER — Ambulatory Visit (HOSPITAL_COMMUNITY): Payer: Medicare Other | Attending: Cardiovascular Disease

## 2021-03-11 DIAGNOSIS — I480 Paroxysmal atrial fibrillation: Secondary | ICD-10-CM | POA: Diagnosis not present

## 2021-03-11 LAB — ECHOCARDIOGRAM COMPLETE
Area-P 1/2: 3.5 cm2
S' Lateral: 2.2 cm

## 2021-03-12 NOTE — Telephone Encounter (Signed)
Left message to call back  

## 2021-03-16 ENCOUNTER — Other Ambulatory Visit: Payer: Self-pay

## 2021-03-16 DIAGNOSIS — I480 Paroxysmal atrial fibrillation: Secondary | ICD-10-CM

## 2021-03-17 DIAGNOSIS — U071 COVID-19: Secondary | ICD-10-CM | POA: Diagnosis not present

## 2021-03-17 NOTE — Progress Notes (Signed)
Carelink Summary Report / Loop Recorder 

## 2021-04-12 ENCOUNTER — Ambulatory Visit (INDEPENDENT_AMBULATORY_CARE_PROVIDER_SITE_OTHER): Payer: Medicare Other

## 2021-04-12 DIAGNOSIS — I480 Paroxysmal atrial fibrillation: Secondary | ICD-10-CM

## 2021-04-13 LAB — CUP PACEART REMOTE DEVICE CHECK
Date Time Interrogation Session: 20230212230925
Implantable Pulse Generator Implant Date: 20210923

## 2021-04-16 NOTE — Progress Notes (Signed)
Carelink Summary Report / Loop Recorder 

## 2021-04-21 DIAGNOSIS — M6281 Muscle weakness (generalized): Secondary | ICD-10-CM | POA: Diagnosis not present

## 2021-04-21 DIAGNOSIS — M62838 Other muscle spasm: Secondary | ICD-10-CM | POA: Diagnosis not present

## 2021-05-07 DIAGNOSIS — R059 Cough, unspecified: Secondary | ICD-10-CM | POA: Diagnosis not present

## 2021-05-07 DIAGNOSIS — U071 COVID-19: Secondary | ICD-10-CM | POA: Diagnosis not present

## 2021-05-10 ENCOUNTER — Telehealth: Payer: Self-pay

## 2021-05-10 DIAGNOSIS — J019 Acute sinusitis, unspecified: Secondary | ICD-10-CM | POA: Diagnosis not present

## 2021-05-10 DIAGNOSIS — H6692 Otitis media, unspecified, left ear: Secondary | ICD-10-CM | POA: Diagnosis not present

## 2021-05-10 DIAGNOSIS — H00011 Hordeolum externum right upper eyelid: Secondary | ICD-10-CM | POA: Diagnosis not present

## 2021-05-10 NOTE — Telephone Encounter (Signed)
Attempted to contact patient, LVM to call back to get reschedule due to emergency we are canceling 03/17 clinic day. Left call back number.  ? ? ?

## 2021-05-11 ENCOUNTER — Encounter: Payer: Self-pay | Admitting: Cardiovascular Disease

## 2021-05-11 NOTE — Telephone Encounter (Signed)
-  Spoke to pt and rescheduled appointment for 3/21 with Dr. Marisue Ivan ?

## 2021-05-14 ENCOUNTER — Ambulatory Visit: Payer: Medicare Other | Admitting: Cardiovascular Disease

## 2021-05-16 NOTE — Progress Notes (Signed)
?Cardiology Office Note:   ?Date:  05/18/2021  ?NAME:  Lonnie Palmer    ?MRN: [id number] ?DOB:  17-Nov-1955  ? ?PCP:  Donald Prose, MD  ?Cardiologist:  Evalina Field, MD  ?Electrophysiologist:  Vickie Epley, MD  ? ?Referring MD: Donald Prose, MD  ? ?Chief Complaint  ?Patient presents with  ? Follow-up  ? ?History of Present Illness:   ?Lonnie Palmer is a 66 y.o. male with a hx of Afib/AFL, CAD, HTN, HLD who presents for follow-up.  He reports he is doing well.  He reports blood pressure may be a little elevated in the afternoons.  Values are between 130 and 140 in the evening.  I informed him this is okay.  Also may notice some palpitations that are brief at night.  They occur with laying down.  No A-fib captured on loop recorder.  He denies any chest pain or trouble breathing.  No limitations with current level of activity.  He is currently on Eliquis 5 mg twice daily.  He is also on aspirin 81 mg daily.  He presents for discussion of Watchman procedure.  I believe this is reasonable.  He wishes to avoid long-term anticoagulation.  No obvious bleeding.  He has recently had a prostatectomy.  Clearly if he needs radiation this will expose him to higher bleeding risk.  Currently I believe he is appropriate for watchman and likely low risk for the procedure.  We discussed CT scan as well as transesophageal echo after the procedure.  He is in agreement.  I suspect he will proceed with this. ? ?Problem List ?1. HTN ?2. HLD ?-Total cholesterol 122, HDL 54, LDL 44, triglycerides 144 ?3. PACs ?-23.3% PAC burden ?4.  Atrial flutter/fibrillation  ?-11/15/2019 ?-CHADSVASC=2 (HTN, CAD) ?-s/p AFL/AF ablation 01/03/2020 ?5. CAD ?-CAC score 385 (82nd percentile) ? ?Past Medical History: ?Past Medical History:  ?Diagnosis Date  ? Atrial fibrillation ([place])   ? Cancer [facility])   ? prostate  ? Coronary artery disease   ? Hyperlipidemia   ? Hypertension   ? ? ?Past Surgical History: ?Past Surgical History:  ?Procedure  Laterality Date  ? APPENDECTOMY    ? ATRIAL FIBRILLATION ABLATION N/A 01/03/2020  ? Procedure: ATRIAL FIBRILLATION ABLATION;  Surgeon: Vickie Epley, MD;  Location: [place] [facility];  Service: Cardiovascular;  Laterality: N/A;  ? HERNIA REPAIR    ? LOOP RECORDER INSERTION N/A 11/21/2019  ? Procedure: LOOP RECORDER INSERTION;  Surgeon: Vickie Epley, MD;  Location: [place] [facility];  Service: Cardiovascular;  Laterality: N/A;  ? LUMBAR DISC SURGERY    ? LYMPHADENECTOMY Bilateral 09/10/2020  ? Procedure: LYMPHADENECTOMY, PELVIC;  Surgeon: Raynelle Bring, MD;  Location: [facility];  Service: Urology;  Laterality: Bilateral;  ? ROBOT ASSISTED LAPAROSCOPIC RADICAL PROSTATECTOMY N/A 09/10/2020  ? Procedure: XI ROBOTIC ASSISTED LAPAROSCOPIC RADICAL PROSTATECTOMY LEVEL 2;  Surgeon: Raynelle Bring, MD;  Location: [facility];  Service: Urology;  Laterality: N/A;  ? VASECTOMY    ? WISDOM TOOTH EXTRACTION    ? ? ?Current Medications: ?Current Meds  ?Medication Sig  ? apixaban (ELIQUIS) 5 MG TABS tablet Take 1 tablet (5 mg total) by mouth 2 (two) times daily.  ? aspirin EC 81 MG tablet Take 1 tablet (81 mg total) by mouth daily. Swallow whole.  ? Coenzyme Q10 50 MG CAPS   ? cyclobenzaprine (FLEXERIL) 10 MG tablet Take 10 mg by mouth at bedtime.   ? docusate sodium (COLACE) 100 MG capsule Take 1  capsule (100 mg total) by mouth 2 (two) times daily.  ? ezetimibe (ZETIA) 10 MG tablet Take 10 mg by mouth at bedtime.   ? HYDROcodone-acetaminophen (NORCO/VICODIN) 5-325 MG tablet Take 1-2 tablets by mouth every 6 (six) hours as needed for moderate pain.  ? lisinopril-hydrochlorothiazide (ZESTORETIC) 10-12.5 MG tablet Take 1 tablet by mouth daily.  ? metoprolol succinate (TOPROL-XL) 50 MG 24 hr tablet Take 1 tablet (50 mg total) by mouth daily. Take with or immediately following a meal.  ? sildenafil (VIAGRA) 100 MG tablet Take by mouth.  ? simvastatin (ZOCOR) 20 MG tablet Take 20 mg by mouth at bedtime.   ?  ? ?Allergies:     ?Lexapro [escitalopram]  ? ?Social History: ?Social History  ? ?Socioeconomic History  ? Marital status: Married  ?  Spouse name: Not on file  ? Number of children: 1  ? Years of education: Not on file  ? Highest education level: Not on file  ?Occupational History  ? Occupation: Contractor  ?Tobacco Use  ? Smoking status: Former  ?  Packs/day: 0.50  ?  Years: 6.00  ?  Pack years: 3.00  ?  Types: Cigarettes  ?  Quit date: 80  ?  Years since quitting: 36.2  ? Smokeless tobacco: Never  ?Vaping Use  ? Vaping Use: Never used  ?Substance and Sexual Activity  ? Alcohol use: Never  ? Drug use: Never  ? Sexual activity: Not on file  ?Other Topics Concern  ? Not on file  ?Social History Narrative  ? Not on file  ? ?Social Determinants of Health  ? ?Financial Resource Strain: Not on file  ?Food Insecurity: Not on file  ?Transportation Needs: Not on file  ?Physical Activity: Not on file  ?Stress: Not on file  ?Social Connections: Not on file  ?  ? ?Family History: ?The patient's family history includes Heart attack in his maternal grandfather. ? ?ROS:   ?All other ROS reviewed and negative. Pertinent positives noted in the HPI.    ? ?EKGs/Labs/Other Studies Reviewed:   ?The following studies were personally reviewed by me today: ? ?EKG:  EKG is ordered today.  The ekg ordered today demonstrates normal sinus rhythm heart rate 80, left bundle branch block, no acute ischemic changes or evidence of infarction, and was personally reviewed by me.  ? ?Recent Labs: ?09/02/2020: BUN 15; Creatinine, Ser 0.98; Platelets 222; Potassium 4.6; Sodium 139 ?09/11/2020: Hemoglobin 13.3  ? ?Recent Lipid Panel ?No results found for: CHOL, TRIG, HDL, CHOLHDL, VLDL, LDLCALC, LDLDIRECT ? ?Physical Exam:   ?VS:  BP 110/72   Pulse 80   Ht '6\' 1"'$  (1.854 m)   Wt 178 lb 9.6 oz (81 kg)   SpO2 98%   BMI 23.56 kg/m?    ?Wt Readings from Last 3 Encounters:  ?05/18/21 178 lb 9.6 oz (81 kg)  ?03/03/21 178 lb 6.4 oz (80.9 kg)  ?12/17/20 175 lb  3.2 oz (79.5 kg)  ?  ?General: Well nourished, well developed, in no acute distress ?Head: Atraumatic, normal size  ?Eyes: PEERLA, EOMI  ?Neck: Supple, no JVD ?Endocrine: No thryomegaly ?Cardiac: Normal S1, S2; RRR; no murmurs, rubs, or gallops ?Lungs: Clear to auscultation bilaterally, no wheezing, rhonchi or rales  ?Abd: Soft, nontender, no hepatomegaly  ?Ext: No edema, pulses 2+ ?Musculoskeletal: No deformities, BUE and BLE strength normal and equal ?Skin: Warm and dry, no rashes   ?Neuro: Alert and oriented to person, place, time, and situation, CNII-XII grossly  intact, no focal deficits  ?Psych: Normal mood and affect  ? ?ASSESSMENT:   ?DURELLE ZEPEDA is a 66 y.o. male who presents for the following: ?1. Paroxysmal atrial fibrillation ([place])   ?2. Typical atrial flutter ([place])   ?3. Coronary artery disease involving native coronary artery of native heart without angina pectoris   ?4. Agatston coronary artery calcium score between 100 and 400   ?5. Mixed hyperlipidemia   ? ? ?PLAN:   ?1. Paroxysmal atrial fibrillation ([place]) ?2. Typical atrial flutter ([place]) ?-History of paroxysmal atrial fibrillation and flutter status post ablation.  No further recurrence on loop recorder.  Doing well. ?-Having some palpitations in the evening.  He will increase his metoprolol succinate to 50 mg twice daily.  He will let us know if this helps.  We can provide a refill based on that. ?-We discussed Watchman procedure.  He wishes to avoid long-term anticoagulation.  He also does have prostate cancer.  This does expose him to bleeding risk in the future.  I believe he is appropriate for the procedure.  If he wishes to proceed he can.  See shared decision discussion below. ? ?[place] ?Referral for Left Atrial Appendage Closure with Non-Valvular Atrial Fibrillation  ?AHNAF CAPONI is a 66 y.o. male is being referred to the [facility] Team for evaluation for Left Atrial Appendage Closure  with Watchman device for the management of stroke risk resulting form non-valvular atrial fibrillation. Base upon Mr. Weesner's history, he is felt to be a poor candidate for long-term anticoagulation because of increased bl

## 2021-05-17 DIAGNOSIS — J019 Acute sinusitis, unspecified: Secondary | ICD-10-CM | POA: Diagnosis not present

## 2021-05-17 DIAGNOSIS — M549 Dorsalgia, unspecified: Secondary | ICD-10-CM | POA: Diagnosis not present

## 2021-05-18 ENCOUNTER — Encounter: Payer: Self-pay | Admitting: Cardiovascular Disease

## 2021-05-18 ENCOUNTER — Other Ambulatory Visit: Payer: Self-pay

## 2021-05-18 ENCOUNTER — Ambulatory Visit: Payer: Medicare Other | Admitting: Cardiovascular Disease

## 2021-05-18 VITALS — BP 110/72 | HR 80 | Ht 73.0 in | Wt 178.6 lb

## 2021-05-18 DIAGNOSIS — I2510 Atherosclerotic heart disease of native coronary artery without angina pectoris: Secondary | ICD-10-CM

## 2021-05-18 DIAGNOSIS — I480 Paroxysmal atrial fibrillation: Secondary | ICD-10-CM | POA: Diagnosis not present

## 2021-05-18 DIAGNOSIS — E782 Mixed hyperlipidemia: Secondary | ICD-10-CM | POA: Diagnosis not present

## 2021-05-18 DIAGNOSIS — I483 Typical atrial flutter: Secondary | ICD-10-CM

## 2021-05-18 DIAGNOSIS — R931 Abnormal findings on diagnostic imaging of heart and coronary circulation: Secondary | ICD-10-CM

## 2021-05-18 NOTE — Patient Instructions (Signed)
Medication Instructions:  °The current medical regimen is effective;  continue present plan and medications. ° °*If you need a refill on your cardiac medications before your next appointment, please call your pharmacy* ° ° °Follow-Up: °At [facility], you and your health needs are our priority.  As part of our continuing mission to provide you with exceptional heart care, we have created designated Provider Care Teams.  These Care Teams include your primary Cardiologist (physician) and Advanced Practice Providers (APPs -  Physician Assistants and Nurse Practitioners) who all work together to provide you with the care you need, when you need it. ° °We recommend signing up for the patient portal called "MyChart".  Sign up information is provided on this After Visit Summary.  MyChart is used to connect with patients for Virtual Visits (Telemedicine).  Patients are able to view lab/test results, encounter notes, upcoming appointments, etc.  Non-urgent messages can be sent to your provider as well.   °To learn more about what you can do with MyChart, go to [email or web address].   ° °Your next appointment:   °12 month(s) ° °The format for your next appointment:   °In Person ° °Provider:   °[place] T O'Neal, MD   ° ° ° °

## 2021-05-19 ENCOUNTER — Ambulatory Visit (INDEPENDENT_AMBULATORY_CARE_PROVIDER_SITE_OTHER): Payer: Medicare Other

## 2021-05-19 DIAGNOSIS — I480 Paroxysmal atrial fibrillation: Secondary | ICD-10-CM

## 2021-05-21 ENCOUNTER — Encounter: Payer: Self-pay | Admitting: Cardiology

## 2021-05-21 DIAGNOSIS — I480 Paroxysmal atrial fibrillation: Secondary | ICD-10-CM

## 2021-05-21 LAB — CUP PACEART REMOTE DEVICE CHECK
Date Time Interrogation Session: 20230324072108
Implantable Pulse Generator Implant Date: 20210923

## 2021-05-24 ENCOUNTER — Other Ambulatory Visit: Payer: Self-pay

## 2021-05-24 ENCOUNTER — Other Ambulatory Visit: Payer: Medicare Other | Admitting: *Deleted

## 2021-05-24 DIAGNOSIS — I480 Paroxysmal atrial fibrillation: Secondary | ICD-10-CM

## 2021-05-24 LAB — BASIC METABOLIC PANEL
BUN/Creatinine Ratio: 17 (ref 10–24)
BUN: 19 mg/dL (ref 8–27)
CO2: 31 mmol/L — ABNORMAL HIGH (ref 20–29)
Calcium: 9.3 mg/dL (ref 8.6–10.2)
Chloride: 101 mmol/L (ref 96–106)
Creatinine, Ser: 1.12 mg/dL (ref 0.76–1.27)
Glucose: 112 mg/dL — ABNORMAL HIGH (ref 70–99)
Potassium: 4.2 mmol/L (ref 3.5–5.2)
Sodium: 140 mmol/L (ref 134–144)
eGFR: 73 mL/min/{1.73_m2} (ref >59)

## 2021-05-24 LAB — CBC WITH DIFFERENTIAL/PLATELET
Basophils Absolute: 0 10*3/uL (ref 0.0–0.2)
Basos: 0 %
EOS (ABSOLUTE): 0.1 10*3/uL (ref 0.0–0.4)
Eos: 1 %
Hematocrit: 46.1 % (ref 37.5–51.0)
Hemoglobin: 15.6 g/dL (ref 13.0–17.7)
Lymphocytes Absolute: 1.5 10*3/uL (ref 0.7–3.1)
Lymphs: 20 %
MCH: 31.2 pg (ref 26.6–33.0)
MCHC: 33.8 g/dL (ref 31.5–35.7)
MCV: 92 fL (ref 79–97)
Monocytes Absolute: 0.9 10*3/uL (ref 0.1–0.9)
Monocytes: 11 %
Neutrophils Absolute: 5.1 10*3/uL (ref 1.4–7.0)
Neutrophils: 68 %
Platelets: 223 10*3/uL (ref 150–450)
RBC: 5 x10E6/uL (ref 4.14–5.80)
RDW: 13.1 % (ref 11.6–15.4)
WBC: 7.5 10*3/uL (ref 3.4–10.8)

## 2021-05-26 ENCOUNTER — Telehealth: Payer: Self-pay

## 2021-05-26 NOTE — Telephone Encounter (Signed)
Spoke with the patient's son, DPR, and informed him of new arrival time of 85 for 0730 case. ? ?The patient called back immediately after getting off the phone with his son. Reviewed medications, instructions, and confirmed new arrival time of 0515 for 0730 case. ?He was grateful for call and agrees with plan.  ?

## 2021-05-26 NOTE — Anesthesia Preprocedure Evaluation (Addendum)
Anesthesia Evaluation  ?Patient identified by MRN, date of birth, ID band ?Patient awake ? ? ? ?Reviewed: ?Allergy & Precautions, H&P , NPO status , Patient's Chart, lab work & pertinent test results ? ?Airway ?Mallampati: III ? ?TM Distance: >3 FB ?Neck ROM: Full ? ? ? Dental ?no notable dental hx. ?(+) Teeth Intact, Dental Advisory Given ?  ?Pulmonary ?neg pulmonary ROS, former smoker,  ?  ?Pulmonary exam normal ?breath sounds clear to auscultation ? ? ? ? ? ? Cardiovascular ?Exercise Tolerance: Good ?hypertension, Pt. on medications and Pt. on home beta blockers ?+ dysrhythmias Atrial Fibrillation  ?Rhythm:Regular Rate:Normal ? ? ?  ?Neuro/Psych ?negative neurological ROS ? negative psych ROS  ? GI/Hepatic ?negative GI ROS, Neg liver ROS,   ?Endo/Other  ?negative endocrine ROS ? Renal/GU ?negative Renal ROS  ?negative genitourinary ?  ?Musculoskeletal ? ? Abdominal ?  ?Peds ? Hematology ?negative hematology ROS ?(+)   ?Anesthesia Other Findings ? ? Reproductive/Obstetrics ?negative OB ROS ? ?  ? ? ? ? ? ? ? ? ? ? ? ? ? ?  ?  ? ? ? ? ? ? ? ?Anesthesia Physical ?Anesthesia Plan ? ?ASA: 3 ? ?Anesthesia Plan: General  ? ?Post-op Pain Management: Tylenol PO (pre-op)*  ? ?Induction: Intravenous ? ?PONV Risk Score and Plan: 3 and Ondansetron, Dexamethasone and Midazolam ? ?Airway Management Planned: Oral ETT ? ?Additional Equipment: Arterial line ? ?Intra-op Plan:  ? ?Post-operative Plan: Extubation in OR ? ?Informed Consent: I have reviewed the patients History and Physical, chart, labs and discussed the procedure including the risks, benefits and alternatives for the proposed anesthesia with the patient or authorized representative who has indicated his/her understanding and acceptance.  ? ? ? ?Dental advisory given ? ?Plan Discussed with: CRNA ? ?Anesthesia Plan Comments:   ? ? ? ? ? ?Anesthesia Quick Evaluation ? ?

## 2021-05-26 NOTE — Telephone Encounter (Signed)
Left message for DPR (wife) to call back. ?

## 2021-05-26 NOTE — Telephone Encounter (Signed)
Called to confirm new arrival time for procedure tomorrow. ?The patient will now need to arrive at [facility] for 0730 procedure.  ? ?Left message to call back. ?

## 2021-05-27 ENCOUNTER — Encounter (HOSPITAL_COMMUNITY): Payer: Self-pay | Admitting: Cardiology

## 2021-05-27 ENCOUNTER — Inpatient Hospital Stay (HOSPITAL_COMMUNITY)
Admission: RE | Admit: 2021-05-27 | Discharge: 2021-05-28 | DRG: 274 | Disposition: A | Discharge status: Home / Self Care | Payer: Medicare Other | Attending: Cardiology | Admitting: Cardiology

## 2021-05-27 ENCOUNTER — Other Ambulatory Visit: Payer: Self-pay

## 2021-05-27 ENCOUNTER — Inpatient Hospital Stay (HOSPITAL_COMMUNITY): Payer: Medicare Other

## 2021-05-27 ENCOUNTER — Encounter (HOSPITAL_COMMUNITY)
Admission: RE | Disposition: A | Discharge status: Home / Self Care | Payer: Medicare Other | Source: Home / Self Care | Attending: Cardiology

## 2021-05-27 ENCOUNTER — Inpatient Hospital Stay (HOSPITAL_COMMUNITY): Payer: Medicare Other | Admitting: Certified Registered Nurse Anesthetist

## 2021-05-27 ENCOUNTER — Inpatient Hospital Stay (HOSPITAL_COMMUNITY)
Admission: RE | Admit: 2021-05-27 | Discharge: 2021-05-27 | Disposition: A | Discharge status: Home / Self Care | Payer: Medicare Other | Source: Home / Self Care | Attending: Cardiology | Admitting: Cardiology

## 2021-05-27 DIAGNOSIS — R9721 Rising PSA following treatment for malignant neoplasm of prostate: Secondary | ICD-10-CM | POA: Diagnosis present

## 2021-05-27 DIAGNOSIS — I088 Other rheumatic multiple valve diseases: Secondary | ICD-10-CM | POA: Diagnosis not present

## 2021-05-27 DIAGNOSIS — Z006 Encounter for examination for normal comparison and control in clinical research program: Secondary | ICD-10-CM

## 2021-05-27 DIAGNOSIS — Z95818 Presence of other cardiac implants and grafts: Secondary | ICD-10-CM

## 2021-05-27 DIAGNOSIS — I2510 Atherosclerotic heart disease of native coronary artery without angina pectoris: Secondary | ICD-10-CM | POA: Diagnosis present

## 2021-05-27 DIAGNOSIS — I480 Paroxysmal atrial fibrillation: Secondary | ICD-10-CM | POA: Diagnosis present

## 2021-05-27 DIAGNOSIS — Z7901 Long term (current) use of anticoagulants: Secondary | ICD-10-CM | POA: Diagnosis not present

## 2021-05-27 DIAGNOSIS — I10 Essential (primary) hypertension: Secondary | ICD-10-CM

## 2021-05-27 DIAGNOSIS — Z8249 Family history of ischemic heart disease and other diseases of the circulatory system: Secondary | ICD-10-CM | POA: Diagnosis not present

## 2021-05-27 DIAGNOSIS — I4891 Unspecified atrial fibrillation: Secondary | ICD-10-CM | POA: Diagnosis not present

## 2021-05-27 DIAGNOSIS — Z79899 Other long term (current) drug therapy: Secondary | ICD-10-CM | POA: Diagnosis not present

## 2021-05-27 DIAGNOSIS — C61 Malignant neoplasm of prostate: Secondary | ICD-10-CM | POA: Diagnosis present

## 2021-05-27 DIAGNOSIS — I4819 Other persistent atrial fibrillation: Secondary | ICD-10-CM | POA: Diagnosis not present

## 2021-05-27 DIAGNOSIS — I3139 Other pericardial effusion (noninflammatory): Secondary | ICD-10-CM | POA: Diagnosis not present

## 2021-05-27 DIAGNOSIS — Z87891 Personal history of nicotine dependence: Secondary | ICD-10-CM

## 2021-05-27 DIAGNOSIS — Z01818 Encounter for other preprocedural examination: Secondary | ICD-10-CM | POA: Diagnosis not present

## 2021-05-27 DIAGNOSIS — Z7982 Long term (current) use of aspirin: Secondary | ICD-10-CM | POA: Diagnosis not present

## 2021-05-27 DIAGNOSIS — E785 Hyperlipidemia, unspecified: Secondary | ICD-10-CM | POA: Diagnosis not present

## 2021-05-27 DIAGNOSIS — I483 Typical atrial flutter: Secondary | ICD-10-CM | POA: Diagnosis present

## 2021-05-27 HISTORY — PX: TEE WITHOUT CARDIOVERSION: SHX5443

## 2021-05-27 HISTORY — PX: LEFT ATRIAL APPENDAGE OCCLUSION: EP1229

## 2021-05-27 HISTORY — DX: Presence of other cardiac implants and grafts: Z95.818

## 2021-05-27 LAB — SURGICAL PCR SCREEN
MRSA, PCR: NEGATIVE
Staphylococcus aureus: NEGATIVE

## 2021-05-27 LAB — TYPE AND SCREEN
ABO/RH(D): B POS
Antibody Screen: NEGATIVE

## 2021-05-27 SURGERY — LEFT ATRIAL APPENDAGE OCCLUSION
Anesthesia: General

## 2021-05-27 MED ORDER — HEPARIN SODIUM (PORCINE) 1000 UNIT/ML IJ SOLN
INTRAMUSCULAR | Status: DC | PRN
  Administered 2021-05-27: 13000 [IU] via INTRAVENOUS

## 2021-05-27 MED ORDER — HEPARIN (PORCINE) IN NACL 1000-0.9 UT/500ML-% IV SOLN
INTRAVENOUS | Status: DC | PRN
  Administered 2021-05-27: 500 mL

## 2021-05-27 MED ORDER — PROPOFOL 10 MG/ML IV BOLUS
INTRAVENOUS | Status: DC | PRN
  Administered 2021-05-27: 110 mg via INTRAVENOUS

## 2021-05-27 MED ORDER — MIDAZOLAM HCL 5 MG/5ML IJ SOLN
INTRAMUSCULAR | Status: DC | PRN
  Administered 2021-05-27 (×2): 1 mg via INTRAVENOUS

## 2021-05-27 MED ORDER — LISINOPRIL 10 MG PO TABS
10.0000 mg | ORAL_TABLET | Freq: Every day | ORAL | Status: DC
  Administered 2021-05-27 – 2021-05-28 (×2): 10 mg via ORAL
  Filled 2021-05-27 (×2): qty 1

## 2021-05-27 MED ORDER — SODIUM CHLORIDE 0.9 % IV SOLN: INTRAVENOUS | Status: DC

## 2021-05-27 MED ORDER — EZETIMIBE 10 MG PO TABS
10.0000 mg | ORAL_TABLET | Freq: Every day | ORAL | Status: DC
  Administered 2021-05-27 – 2021-05-28 (×2): 10 mg via ORAL
  Filled 2021-05-27 (×2): qty 1

## 2021-05-27 MED ORDER — FENTANYL CITRATE (PF) 100 MCG/2ML IJ SOLN
INTRAMUSCULAR | Status: DC | PRN
  Administered 2021-05-27: 50 ug via INTRAVENOUS

## 2021-05-27 MED ORDER — LISINOPRIL-HYDROCHLOROTHIAZIDE 10-12.5 MG PO TABS: 1.0000 | ORAL_TABLET | Freq: Every day | ORAL | Status: DC

## 2021-05-27 MED ORDER — ONDANSETRON HCL 4 MG/2ML IJ SOLN
INTRAMUSCULAR | Status: DC | PRN
  Administered 2021-05-27: 4 mg via INTRAVENOUS

## 2021-05-27 MED ORDER — ROCURONIUM BROMIDE 10 MG/ML (PF) SYRINGE
PREFILLED_SYRINGE | INTRAVENOUS | Status: DC | PRN
  Administered 2021-05-27: 70 mg via INTRAVENOUS

## 2021-05-27 MED ORDER — CYCLOBENZAPRINE HCL 10 MG PO TABS
10.0000 mg | ORAL_TABLET | Freq: Every day | ORAL | Status: DC
  Administered 2021-05-27: 10 mg via ORAL
  Filled 2021-05-27: qty 1

## 2021-05-27 MED ORDER — DEXAMETHASONE SODIUM PHOSPHATE 10 MG/ML IJ SOLN
INTRAMUSCULAR | Status: DC | PRN
  Administered 2021-05-27: 5 mg via INTRAVENOUS

## 2021-05-27 MED ORDER — ONDANSETRON HCL 4 MG/2ML IJ SOLN: 4.0000 mg | Freq: Four times a day (QID) | INTRAMUSCULAR | Status: DC | PRN

## 2021-05-27 MED ORDER — CHLORHEXIDINE GLUCONATE 0.12 % MT SOLN
OROMUCOSAL | Status: AC
  Administered 2021-05-27: 15 mL
  Filled 2021-05-27: qty 15

## 2021-05-27 MED ORDER — SODIUM CHLORIDE 0.9% FLUSH
3.0000 mL | Freq: Two times a day (BID) | INTRAVENOUS | Status: DC
Start: 2021-05-27 — End: 2021-05-28
  Administered 2021-05-27 (×2): 3 mL via INTRAVENOUS

## 2021-05-27 MED ORDER — COENZYME Q10 50 MG PO CAPS: 100.0000 mg | ORAL_CAPSULE | Freq: Every day | ORAL | Status: DC

## 2021-05-27 MED ORDER — EPHEDRINE SULFATE-NACL 50-0.9 MG/10ML-% IV SOSY
PREFILLED_SYRINGE | INTRAVENOUS | Status: DC | PRN
  Administered 2021-05-27 (×2): 2.5 mg via INTRAVENOUS

## 2021-05-27 MED ORDER — HYDROCHLOROTHIAZIDE 12.5 MG PO TABS
12.5000 mg | ORAL_TABLET | Freq: Every day | ORAL | Status: DC
  Administered 2021-05-27 – 2021-05-28 (×2): 12.5 mg via ORAL
  Filled 2021-05-27 (×2): qty 1

## 2021-05-27 MED ORDER — PHENYLEPHRINE 40 MCG/ML (10ML) SYRINGE FOR IV PUSH (FOR BLOOD PRESSURE SUPPORT)
PREFILLED_SYRINGE | INTRAVENOUS | Status: DC | PRN
  Administered 2021-05-27 (×4): 40 ug via INTRAVENOUS

## 2021-05-27 MED ORDER — PROTAMINE SULFATE 10 MG/ML IV SOLN
INTRAVENOUS | Status: DC | PRN
  Administered 2021-05-27: 30 mg via INTRAVENOUS

## 2021-05-27 MED ORDER — ASPIRIN EC 81 MG PO TBEC
81.0000 mg | DELAYED_RELEASE_TABLET | Freq: Every day | ORAL | Status: DC
  Administered 2021-05-28: 81 mg via ORAL
  Filled 2021-05-27: qty 1

## 2021-05-27 MED ORDER — CEFAZOLIN SODIUM-DEXTROSE 2-4 GM/100ML-% IV SOLN
INTRAVENOUS | Status: AC
  Filled 2021-05-27: qty 100

## 2021-05-27 MED ORDER — LIDOCAINE 2% (20 MG/ML) 5 ML SYRINGE
INTRAMUSCULAR | Status: DC | PRN
  Administered 2021-05-27: 60 mg via INTRAVENOUS

## 2021-05-27 MED ORDER — EZETIMIBE-SIMVASTATIN 10-20 MG PO TABS: 1.0000 | ORAL_TABLET | Freq: Every day | ORAL | Status: DC

## 2021-05-27 MED ORDER — LACTATED RINGERS IV SOLN: INTRAVENOUS | Status: DC | PRN

## 2021-05-27 MED ORDER — PHENYLEPHRINE HCL-NACL 20-0.9 MG/250ML-% IV SOLN
INTRAVENOUS | Status: DC | PRN
  Administered 2021-05-27: 40 ug/min via INTRAVENOUS

## 2021-05-27 MED ORDER — IOHEXOL 350 MG/ML SOLN
INTRAVENOUS | Status: DC | PRN
  Administered 2021-05-27: 10 mL

## 2021-05-27 MED ORDER — SIMVASTATIN 20 MG PO TABS
20.0000 mg | ORAL_TABLET | Freq: Every day | ORAL | Status: DC
  Administered 2021-05-27: 20 mg via ORAL
  Filled 2021-05-27: qty 1

## 2021-05-27 MED ORDER — GUAIFENESIN-DM 100-10 MG/5ML PO SYRP
15.0000 mL | ORAL_SOLUTION | ORAL | Status: DC | PRN
  Administered 2021-05-27 – 2021-05-28 (×2): 15 mL via ORAL
  Filled 2021-05-27 (×2): qty 15

## 2021-05-27 MED ORDER — CEFAZOLIN SODIUM-DEXTROSE 2-4 GM/100ML-% IV SOLN
2.0000 g | INTRAVENOUS | Status: AC
  Administered 2021-05-27: 2 g via INTRAVENOUS

## 2021-05-27 MED ORDER — APIXABAN 5 MG PO TABS
5.0000 mg | ORAL_TABLET | Freq: Two times a day (BID) | ORAL | Status: DC
Start: 2021-05-27 — End: 2021-05-28
  Administered 2021-05-27 – 2021-05-28 (×3): 5 mg via ORAL
  Filled 2021-05-27 (×3): qty 1

## 2021-05-27 MED ORDER — ACETAMINOPHEN 500 MG PO TABS
1000.0000 mg | ORAL_TABLET | Freq: Once | ORAL | Status: AC
  Filled 2021-05-27: qty 2

## 2021-05-27 MED ORDER — HYDROCODONE-ACETAMINOPHEN 5-325 MG PO TABS
1.0000 | ORAL_TABLET | Freq: Four times a day (QID) | ORAL | Status: DC | PRN
  Administered 2021-05-27 – 2021-05-28 (×3): 1 via ORAL
  Filled 2021-05-27 (×3): qty 1

## 2021-05-27 MED ORDER — SODIUM CHLORIDE 0.9 % IV SOLN
250.0000 mL | INTRAVENOUS | Status: DC | PRN
Start: 2021-05-27 — End: 2021-05-28

## 2021-05-27 MED ORDER — METOPROLOL SUCCINATE ER 50 MG PO TB24
50.0000 mg | ORAL_TABLET | Freq: Every day | ORAL | Status: DC
  Administered 2021-05-28: 50 mg via ORAL
  Filled 2021-05-27: qty 1

## 2021-05-27 MED ORDER — ACETAMINOPHEN 500 MG PO TABS
ORAL_TABLET | ORAL | Status: AC
  Administered 2021-05-27: 1000 mg via ORAL
  Filled 2021-05-27: qty 2

## 2021-05-27 MED ORDER — DOCUSATE SODIUM 100 MG PO CAPS
100.0000 mg | ORAL_CAPSULE | Freq: Two times a day (BID) | ORAL | Status: DC
  Administered 2021-05-27 – 2021-05-28 (×3): 100 mg via ORAL
  Filled 2021-05-27 (×3): qty 1

## 2021-05-27 MED ORDER — ACETAMINOPHEN 325 MG PO TABS: 650.0000 mg | ORAL_TABLET | ORAL | Status: DC | PRN

## 2021-05-27 MED ORDER — HEPARIN (PORCINE) IN NACL 2000-0.9 UNIT/L-% IV SOLN
INTRAVENOUS | Status: DC | PRN
  Administered 2021-05-27: 1000 mL

## 2021-05-27 MED ORDER — SODIUM CHLORIDE 0.9% FLUSH
3.0000 mL | INTRAVENOUS | Status: DC | PRN
Start: 2021-05-27 — End: 2021-05-28

## 2021-05-27 MED ORDER — SUGAMMADEX SODIUM 200 MG/2ML IV SOLN
INTRAVENOUS | Status: DC | PRN
  Administered 2021-05-27: 200 mg via INTRAVENOUS

## 2021-05-27 SURGICAL SUPPLY — 18 items
CATH 5FR PIGTAIL DIAGNOSTIC (CATHETERS) ×1 IMPLANT
CLOSURE PERCLOSE PROSTYLE (VASCULAR PRODUCTS) ×2 IMPLANT
DEVICE WATCHMAN FLX PROC (KITS) IMPLANT
DILATOR VESSEL 38 20CM 11FR (INTRODUCER) ×1 IMPLANT
KIT HEART LEFT (KITS) ×2 IMPLANT
KIT SHEA VERSACROSS LAAC CONNE (KITS) ×1 IMPLANT
PACK CARDIAC CATHETERIZATION (CUSTOM PROCEDURE TRAY) ×2 IMPLANT
PAD DEFIB RADIO PHYSIO CONN (PAD) ×2 IMPLANT
SHEATH PERFORMER 16FR 30 (SHEATH) ×1 IMPLANT
SHEATH PINNACLE 8F 10CM (SHEATH) ×1 IMPLANT
SHEATH PROBE COVER 6X72 (BAG) ×3 IMPLANT
SYS WATCHMAN FXD DBL (SHEATH) ×2
SYSTEM WATCHMAN FXD DBL (SHEATH) IMPLANT
TRANSDUCER W/STOPCOCK (MISCELLANEOUS) ×2 IMPLANT
TUBING CIL FLEX 10 FLL-RA (TUBING) ×2 IMPLANT
WATCHMAN FLX 27 (Prosthesis & Implant Heart) ×1 IMPLANT
WATCHMAN FLX PROCEDURE DEVICE (KITS) ×2 IMPLANT
WATCHMAN PROCED TRUSEAL ACCESS (SHEATH) ×1 IMPLANT

## 2021-05-27 NOTE — H&P (Addendum)
?Electrophysiology Office Follow up Visit Note:   ?  ?Date:  05/27/2021  ?  ?ID:  Lonnie Palmer, DOB August 21, 1955, MRN [id number] ?  ?PCP:  Donald Prose, MD          ?[facility] Cardiologist:  Evalina Field, MD  ?[facility] Electrophysiologist:  Vickie Epley, MD  ?  ?  ?Interval History:   ?  ?Lonnie Palmer is a 66 y.o. male who presents for a follow up visit.  He underwent a successful A. fib ablation on January 03, 2020.  He has done well since the ablation without recurrence of his arrhythmia.  We monitor his rhythm using a loop recorder.  He continues to take Eliquis for stroke prophylaxis but is interested in avoiding long-term exposure to anticoagulation if at all possible.  He presents to discuss left atrial appendage occlusion.  He is with his wife today in clinic. ?  ?Since I last saw him he is undergone treatment for prostate cancer including a robotic prostatectomy.  His recent scans showed no evidence of disease. ?  ?Doing well today. No recurrence of arrhythmia.  ? ? ? ? ?  ?  ?Objective  ?  ?  ?    ?Past Medical History:  ?Diagnosis Date  ? Atrial fibrillation ([place])    ? Cancer [facility])    ?  prostate  ? Coronary artery disease    ? Hyperlipidemia    ? Hypertension    ?  ?  ?     ?Past Surgical History:  ?Procedure Laterality Date  ? APPENDECTOMY      ? ATRIAL FIBRILLATION ABLATION N/A 01/03/2020  ?  Procedure: ATRIAL FIBRILLATION ABLATION;  Surgeon: Vickie Epley, MD;  Location: [facility];  Service: Cardiovascular;  Laterality: N/A;  ? HERNIA REPAIR      ? LOOP RECORDER INSERTION N/A 11/21/2019  ?  Procedure: LOOP RECORDER INSERTION;  Surgeon: Vickie Epley, MD;  Location: [facility];  Service: Cardiovascular;  Laterality: N/A;  ? LUMBAR DISC SURGERY      ? LYMPHADENECTOMY Bilateral 09/10/2020  ?  Procedure: LYMPHADENECTOMY, PELVIC;  Surgeon: Raynelle Bring, MD;  Location: [facility];  Service: Urology;  Laterality: Bilateral;  ? ROBOT ASSISTED LAPAROSCOPIC  RADICAL PROSTATECTOMY N/A 09/10/2020  ?  Procedure: XI ROBOTIC ASSISTED LAPAROSCOPIC RADICAL PROSTATECTOMY LEVEL 2;  Surgeon: Raynelle Bring, MD;  Location: [facility];  Service: Urology;  Laterality: N/A;  ? VASECTOMY      ? WISDOM TOOTH EXTRACTION      ?  ?  ?Current Medications: ?Active Medications  ?    ?Current Meds  ?Medication Sig  ? apixaban (ELIQUIS) 5 MG TABS tablet Take 1 tablet (5 mg total) by mouth 2 (two) times daily.  ? aspirin EC 81 MG tablet Take 1 tablet (81 mg total) by mouth daily. Swallow whole.  ? Coenzyme Q10 50 MG CAPS    ? cyclobenzaprine (FLEXERIL) 10 MG tablet Take 10 mg by mouth at bedtime.   ? docusate sodium (COLACE) 100 MG capsule Take 1 capsule (100 mg total) by mouth 2 (two) times daily.  ? ezetimibe (ZETIA) 10 MG tablet Take 10 mg by mouth at bedtime.   ? HYDROcodone-acetaminophen (NORCO/VICODIN) 5-325 MG tablet Take 1-2 tablets by mouth every 6 (six) hours as needed for moderate pain.  ? lisinopril-hydrochlorothiazide (ZESTORETIC) 10-12.5 MG tablet Take 1 tablet by mouth daily.  ? metoprolol succinate (TOPROL-XL) 50 MG 24 hr tablet Take 1 tablet (  50 mg total) by mouth daily. Take with or immediately following a meal.  ? sildenafil (VIAGRA) 100 MG tablet Take by mouth.  ? simvastatin (ZOCOR) 20 MG tablet Take 20 mg by mouth at bedtime.   ?  ?  ?  ?Allergies:   Lexapro [escitalopram]  ?  ?Social History  ?  ?     ?Socioeconomic History  ? Marital status: Married  ?    Spouse name: Not on file  ? Number of children: 1  ? Years of education: Not on file  ? Highest education level: Not on file  ?Occupational History  ? Occupation: Contractor  ?Tobacco Use  ? Smoking status: Former  ?    Packs/day: 0.50  ?    Years: 6.00  ?    Pack years: 3.00  ?    Types: Cigarettes  ?    Quit date: 23  ?    Years since quitting: 36.0  ? Smokeless tobacco: Never  ?Vaping Use  ? Vaping Use: Never used  ?Substance and Sexual Activity  ? Alcohol use: Never  ? Drug use: Never  ? Sexual activity:  Not on file  ?Other Topics Concern  ? Not on file  ?Social History Narrative  ? Not on file  ?  ?Social Determinants of Health  ?  ?Financial Resource Strain: Not on file  ?Food Insecurity: Not on file  ?Transportation Needs: Not on file  ?Physical Activity: Not on file  ?Stress: Not on file  ?Social Connections: Not on file  ?  ?  ?Family History: ?The patient's family history includes Heart attack in his maternal grandfather. ?  ?ROS:   ?Please see the history of present illness.    ?All other systems reviewed and are negative. ?  ?EKGs/Labs/Other Studies Reviewed:   ?  ?The following studies were reviewed today: ?  ?Loop recorder interrogations ?  ?December 30, 2019 CT PV protocol reviewed.  Left atrial appendage is large windsock morphology with a ostial size of 25 x 18 and a length of 31 mm. ?  ?  ?EKG:  The ekg ordered today demonstrates sinus rhythm, right bundle branch block ?  ?Recent Labs: ?09/02/2020: BUN 15; Creatinine, Ser 0.98; Platelets 222; Potassium 4.6; Sodium 139 ?09/11/2020: Hemoglobin 13.3  ?Recent Lipid Panel ?Labs (Brief)  ?No results found for: CHOL, TRIG, HDL, CHOLHDL, VLDL, LDLCALC, LDLDIRECT  ? ?  ?Physical Exam:   ?  ?VS:  BP 149/81   Pulse 65   Ht '6\' 1"'$  (1.854 m)   Wt 178 lb 6.4 oz (80.9 kg)   SpO2 98%   BMI 23.54 kg/m?    ?  ?   ?Wt Readings from Last 3 Encounters:  ?03/03/21 178 lb 6.4 oz (80.9 kg)  ?12/17/20 175 lb 3.2 oz (79.5 kg)  ?09/10/20 174 lb 12.8 oz (79.3 kg)  ?  ?  ?GEN:  Well nourished, well developed in no acute distress ?HEENT: Normal ?NECK: No JVD; No carotid bruits ?LYMPHATICS: No lymphadenopathy ?CARDIAC: RRR, no murmurs, rubs, gallops ?RESPIRATORY:  Clear to auscultation without rales, wheezing or rhonchi  ?ABDOMEN: Soft, non-tender, non-distended ?MUSCULOSKELETAL:  No edema; No deformity  ?SKIN: Warm and dry ?NEUROLOGIC:  Alert and oriented x 3 ?PSYCHIATRIC:  Normal affect  ?  ?  ?  ?  ?  ?Assessment ?  ?  ?ASSESSMENT:   ?  ?1. Paroxysmal atrial fibrillation (HCC)    ?2. Typical atrial flutter ([place])   ?3. Coronary artery  disease involving native coronary artery of native heart without angina pectoris   ?4. Essential hypertension   ?  ?PLAN:   ?  ?In order of problems listed above: ?  ?#Paroxysmal atrial fibrillation and flutter ?Now post successful ablation on January 03, 2020.  He is maintaining normal rhythm without recurrence on loop recorder monitoring.  He takes Eliquis for stroke prophylaxis and a CHA2DS2-VASc of at least 3.  He is interested in avoiding long-term exposure anticoagulation which I think is reasonable.  We discussed left atrial appendage occlusion during today's appointment.  I do think he would be a good candidate for the procedure.  We discussed the procedure in detail including the risks and recovery.  We discussed how the left atrial appendage occlusion procedure does not completely eliminate the risk of stroke but does reduce the risk of stroke related to left atrial appendage cardioembolic event. ?   ?Procedural risks for the Watchman implant have been reviewed with the patient including a 0.5% risk of stroke, <1% risk of perforation and <1% risk of device embolization.  ?  ? Plan for [place]ora today. Procedure reviewed. ?  ?  ? ?Lars Mage, MD, [facility], [facility] ?05/27/2021 ?Electrophysiology ?[place] ?  ?  ? ? ? ?HAS-BLED score 2  ?Hypertension Yes  ?Abnormal renal and liver function (Dialysis, transplant, Cr >2.26 mg/dL /Cirrhosis or Bilirubin >2x Normal or AST/ALT/AP >3x Normal) No  ?Stroke No  ?Bleeding No  ?Labile INR (Unstable/high INR) No  ?Elderly (>65) Yes  ?Drugs or alcohol (>/=8 drinks/week, anti-plt or NSAID) No  ?   ?

## 2021-05-27 NOTE — Anesthesia Procedure Notes (Signed)
Arterial Line Insertion ?Start/End3/30/2023 7:15 AM ?Performed by: Janene Harvey, CRNA, CRNA ? Patient location: Pre-op. ?Preanesthetic checklist: patient identified, IV checked, surgical consent, pre-op evaluation and timeout performed ?Lidocaine 1% used for infiltration ?Right, radial was placed ?Catheter size: 20 G ?Hand hygiene performed  and maximum sterile barriers used  ?Allen's test indicative of satisfactory collateral circulation ?Attempts: 2 ?Procedure performed without using ultrasound guided technique. ?Following insertion, dressing applied and Biopatch. ?Post procedure assessment: unchanged ? ?Patient tolerated the procedure well with no immediate complications. ? ? ?

## 2021-05-27 NOTE — Anesthesia Postprocedure Evaluation (Signed)
Anesthesia Post Note ? ?Patient: Lonnie Palmer ? ?Procedure(s) Performed: LEFT ATRIAL APPENDAGE OCCLUSION ?TRANSESOPHAGEAL ECHOCARDIOGRAM (TEE) ? ?  ? ?Patient location during evaluation: Cath Lab ?Anesthesia Type: General ?Level of consciousness: awake and alert ?Pain management: pain level controlled ?Vital Signs Assessment: post-procedure vital signs reviewed and stable ?Respiratory status: spontaneous breathing, nonlabored ventilation and respiratory function stable ?Cardiovascular status: blood pressure returned to baseline and stable ?Postop Assessment: no apparent nausea or vomiting ?Anesthetic complications: no ? ? ?No notable events documented. ? ?Last Vitals:  ?Vitals:  ? 05/27/21 0907 05/27/21 1030  ?BP: 116/61 108/69  ?Pulse: 72 70  ?Resp: 18   ?Temp: (!) 26.8 ?C 36.7 ?C  ?SpO2:  98%  ?  ?Last Pain:  ?Vitals:  ? 05/27/21 1030  ?TempSrc: Oral  ?PainSc: 0-No pain  ? ? ?  ?  ?  ?  ?  ?  ? ?Fannie Gathright,W. EDMOND ? ? ? ? ?

## 2021-05-27 NOTE — Transfer of Care (Signed)
Immediate Anesthesia Transfer of Care Note ? ?Patient: Lonnie Palmer ? ?Procedure(s) Performed: LEFT ATRIAL APPENDAGE OCCLUSION ?TRANSESOPHAGEAL ECHOCARDIOGRAM (TEE) ? ?Patient Location: Cath Lab ? ?Anesthesia Type:General ? ?Level of Consciousness: drowsy and patient cooperative ? ?Airway & Oxygen Therapy: Patient Spontanous Breathing and Patient connected to nasal cannula oxygen ? ?Post-op Assessment: Report given to RN and Post -op Vital signs reviewed and stable ? ?Post vital signs: Reviewed and stable ? ?Last Vitals:  ?Vitals Value Taken Time  ?BP 108/68 05/27/21 0859  ?Temp    ?Pulse 73 05/27/21 0902  ?Resp 12 05/27/21 0902  ?SpO2 100 % 05/27/21 0902  ?Vitals shown include unvalidated device data. ? ?Last Pain:  ?Vitals:  ? 05/27/21 0733  ?TempSrc:   ?PainSc: 0-No pain  ?   ? ?  ? ?Complications: No notable events documented. ?

## 2021-05-27 NOTE — Progress Notes (Signed)
?   20 g, R radial arterial line was removed and manual pressure was held for 10 min. Sterile gauze was applied at the site. R radial pulse was 2+, with capillary refill of < 3 sec. No hematoma was present. ?

## 2021-05-27 NOTE — Progress Notes (Incomplete)
?  [facility]   ? ?Patient doing well s/p Watchman. He/She is hemodynamically stable. Groin sites stable. ECG with no high grade block. Arterial line discontinued and transferred to 4E. *** Plan to DC arterial line and transfer to 4E. *** Plan for early ambulation after bedrest completed and hopeful discharge over the next 24-48 hours.  ? ?Angelena Form PA-C  [facility]  ?Pager [phone]  ?

## 2021-05-27 NOTE — Anesthesia Procedure Notes (Signed)
Procedure Name: Intubation ?Date/Time: 05/27/2021 7:44 AM ?Performed by: Janene Harvey, CRNA ?Pre-anesthesia Checklist: Patient identified, Emergency Drugs available, Suction available and Patient being monitored ?Patient Re-evaluated:Patient Re-evaluated prior to induction ?Oxygen Delivery Method: Circle system utilized ?Preoxygenation: Pre-oxygenation with 100% oxygen ?Induction Type: IV induction ?Ventilation: Mask ventilation without difficulty and Oral airway inserted - appropriate to patient size ?Laryngoscope Size: Glidescope and 3 ?Grade View: Grade I ?Tube type: Oral ?Tube size: 7.5 mm ?Number of attempts: 1 ?Airway Equipment and Method: Stylet and Oral airway ?Placement Confirmation: ETT inserted through vocal cords under direct vision, positive ETCO2 and breath sounds checked- equal and bilateral ?Secured at: 23 cm ?Tube secured with: Tape ?Dental Injury: Teeth and Oropharynx as per pre-operative assessment  ?Difficulty Due To: Difficulty was anticipated and Difficult Airway- due to reduced neck mobility ?Comments: Pt with hx glidescope intubation. On exam limited ROM neck ? ? ? ? ?

## 2021-05-28 ENCOUNTER — Encounter: Payer: Self-pay | Admitting: Cardiology

## 2021-05-28 LAB — BASIC METABOLIC PANEL
Anion gap: 7 (ref 5–15)
BUN: 18 mg/dL (ref 8–23)
CO2: 27 mmol/L (ref 22–32)
Calcium: 8.8 mg/dL — ABNORMAL LOW (ref 8.9–10.3)
Chloride: 102 mmol/L (ref 98–111)
Creatinine, Ser: 1.1 mg/dL (ref 0.61–1.24)
GFR, Estimated: >60 mL/min (ref >60)
Glucose, Bld: 116 mg/dL — ABNORMAL HIGH (ref 70–99)
Potassium: 4.1 mmol/L (ref 3.5–5.1)
Sodium: 136 mmol/L (ref 135–145)

## 2021-05-28 LAB — POCT ACTIVATED CLOTTING TIME: Activated Clotting Time: 305 seconds

## 2021-05-28 MED ORDER — AMOXICILLIN 500 MG PO CAPS: 2000.0000 mg | ORAL_CAPSULE | ORAL | 0 refills | Status: DC

## 2021-05-28 NOTE — Discharge Summary (Signed)
?[facility]   ? ?Patient ID: Lonnie Palmer,  ?MRN: [id number], DOB/AGE: 1955/12/04 66 y.o. ? ?Admit date: 05/27/2021 ?Discharge date: 05/28/2021 ? ?Primary Care Physician: Donald Prose, MD  ?Primary Cardiologist: Evalina Field, MD  ?Electrophysiologist: Vickie Epley, MD ? ?Procedures This Admission:  ? ?CONCLUSIONS:  ?1.Successful implantation of a WATCHMAN left atrial appendage occlusive device    ?2. TEE demonstrating no LAA thrombus ?3. No early apparent complications.  ?  ?Post Implant Anticoagulation Strategy: ?Continue Eliquis '5mg'$  PO BID and Aspirin '81mg'$  PO daily. If 45-day follow up TEE meets criteria, plan to stop Eliquis and start Plavix '75mg'$  PO daily to complete 6 months of post implant therapy.  ? ?Brief HPI: ?Lonnie Palmer is a 66 y.o. male with a history of Afib/AFL, CAD, HTN, and HLD who was referred to Dr. Quentin Ore after discussing avoidance of long term anticoagulation. He underwent AF ablation 01/03/2020 without recurrent arrhythmia. He was felt to be a good candidate for Watchman after CT imaging was reviewed.  Plan was for implant 05/27/21 ? ?Hospital Course:  ?The patient was admitted and underwent LAAO placement with Watchman FLX 80m.  He was monitored on telemetry overnight which demonstrated NSR/ST. Groin site has remained stable without complication on the day of discharge.  The patient was seen and examined by Dr. LQuentin Oreand considered stable for discharge. Procedure site care and restrictions were reviewed with the patient. Follow up appointments with Structural Heart APP have been made for approximately one month after implant. A repeat TEE will be performed at approximately 45 days post procedure to ensure proper seal of the device and to ensure no device thrombus. SBE discussed and will be RX'ed with Amoxicillin 2g PO at least one hour prior to dental cleanings and procedures for the next 6 months.  ? ?Anticoagulation plan: Continue Eliquis '5mg'$  PO BID and  Aspirin '81mg'$  PO daily. If 45-day follow up TEE meets criteria, plan to stop Eliquis and start Plavix '75mg'$  PO daily to complete 6 months of post implant therapy.  ? ?Physical Exam: ?Vitals:  ? 05/27/21 2322 05/28/21 0341 05/28/21 0746 05/28/21 0813  ?BP: 111/72 (!) 103/55 112/67   ?Pulse: 65 64  91  ?Resp: '19 16 17   '$ ?Temp: 97.9 ?F (36.6 ?C) 98.2 ?F (36.8 ?C) 97.8 ?F (36.6 ?C)   ?TempSrc: Oral Oral Oral   ?SpO2: 95% 95%    ?Weight:      ?Height:      ? ?General: Well developed, well nourished, NAD ?Lungs:Clear to ausculation bilaterally. No wheezes, rales, or rhonchi. Breathing is unlabored. ?Cardiovascular: RRR with S1 S2. No murmurs ?Extremities: No edema. ?Neuro: Alert and oriented. No focal deficits. No facial asymmetry. MAE spontaneously. ?Psych: Responds to questions appropriately with normal affect.   ? ?Labs: ?  ?Lab Results  ?Component Value Date  ? WBC 7.5 05/24/2021  ? HGB 15.6 05/24/2021  ? HCT 46.1 05/24/2021  ? MCV 92 05/24/2021  ? PLT 223 05/24/2021  ?  ?Recent Labs  ?Lab 05/28/21 ?04801 ?NA 136  ?K 4.1  ?CL 102  ?CO2 27  ?BUN 18  ?CREATININE 1.10  ?CALCIUM 8.8*  ?GLUCOSE 116*  ? ? ?Discharge Medications:  ?Allergies as of 05/28/2021   ? ?   Reactions  ? Escitalopram Diarrhea  ? ?  ? ?  ?Medication List  ?  ? ?TAKE these medications   ? ?amoxicillin 500 MG capsule ?Commonly known as: AMOXIL ?Take 4 capsules (2,000 mg total) by  mouth as directed. Take 4 tablets 1 hour prior to dental work, including cleanings. ?  ?apixaban 5 MG Tabs tablet ?Commonly known as: Eliquis ?Take 1 tablet (5 mg total) by mouth 2 (two) times daily. ?  ?aspirin EC 81 MG tablet ?Take 1 tablet (81 mg total) by mouth daily. Swallow whole. ?  ?Centrum Silver Adult 50+ Tabs ?Take 1 tablet by mouth daily. ?  ?Coenzyme Q10 50 MG Caps ?Take 100 mg by mouth at bedtime. ?  ?cyclobenzaprine 10 MG tablet ?Commonly known as: FLEXERIL ?Take 10 mg by mouth at bedtime. ?  ?docusate sodium 100 MG capsule ?Commonly known as: COLACE ?Take 1 capsule  (100 mg total) by mouth 2 (two) times daily. ?  ?ezetimibe-simvastatin 10-20 MG tablet ?Commonly known as: VYTORIN ?Take 1 tablet by mouth at bedtime. ?  ?HYDROcodone-acetaminophen 5-325 MG tablet ?Commonly known as: NORCO/VICODIN ?Take 1-2 tablets by mouth every 6 (six) hours as needed for moderate pain. ?What changed: when to take this ?  ?lisinopril-hydrochlorothiazide 10-12.5 MG tablet ?Commonly known as: ZESTORETIC ?Take 1 tablet by mouth daily. ?  ?metoprolol succinate 50 MG 24 hr tablet ?Commonly known as: TOPROL-XL ?Take 1 tablet (50 mg total) by mouth daily. Take with or immediately following a meal. ?What changed:  ?how much to take ?when to take this ?additional instructions ?  ?sildenafil 100 MG tablet ?Commonly known as: VIAGRA ?Take 100 mg by mouth daily as needed for erectile dysfunction. ?  ? ?  ? ? ?Disposition: Home  ?Discharge Instructions   ? ? Call MD for:  difficulty breathing, headache or visual disturbances   Complete by: As directed ?  ? Call MD for:  extreme fatigue   Complete by: As directed ?  ? Call MD for:  hives   Complete by: As directed ?  ? Call MD for:  persistant dizziness or light-headedness   Complete by: As directed ?  ? Call MD for:  persistant nausea and vomiting   Complete by: As directed ?  ? Call MD for:  redness, tenderness, or signs of infection (pain, swelling, redness, odor or green/yellow discharge around incision site)   Complete by: As directed ?  ? Call MD for:  severe uncontrolled pain   Complete by: As directed ?  ? Call MD for:  temperature >100.4   Complete by: As directed ?  ? Diet - low sodium heart healthy   Complete by: As directed ?  ? Discharge instructions   Complete by: As directed ?  ? WATCHMAN? Procedure, Care After ? ?Procedure MD: Dr. Quentin Ore ?Watchman Clinical Coordinator: Lenice Llamas, RN ? ?This sheet gives you information about how to care for yourself after your procedure. Your health care provider may also give you more specific instructions. If  you have problems or questions, contact your health care provider. ? ?What can I expect after the procedure? ?After the procedure, it is common to have: ?Bruising around your puncture site. ?Tenderness around your puncture site. ?Tiredness (fatigue). ? ?Medication instructions ?It is very important to continue to take your blood thinner as directed by your doctor after the Watchman procedure. Call your procedure doctor's office with question or concerns. ?If you are on Coumadin (warfarin), you will have your INR checked the week after your procedure, with a goal INR of 2.0 - 3.0. ?Please follow your medication instructions on your discharge summary. Only take the medications listed on your discharge paperwork. ? ?You have been prescribed Amoxicillin 2g to be taken one  hour prior to dental cleanings and procedures only for the next 6 months until your cardiac tissue has healed over the device. This has been sent to your pharmacy.  ? ?Follow up ?You will be seen in 1 month after your procedure in the Atrial Fibrillation Clinic ?You will have another TEE (Transesophageal Echocardiogram) approximately 6 weeks after your procedure mark to check your device ?You will follow up the MD/APP who performed your procedure 6 months after your procedure ?The Watchman Clinical Coordinator will check in with you from time to time, including 1 and 2 years after your procedure.  ? ? ?Follow these instructions at home: ?Puncture site care  ?Follow instructions from your health care provider about how to take care of your puncture site. Make sure you: ?If present, leave stitches (sutures), skin glue, or adhesive strips in place.  ?If a large square bandage is present, this may be removed 24 hours after surgery.  ?Check your puncture site every day for signs of infection. Check for: ?Redness, swelling, or pain. ?Fluid or blood. If your puncture site starts to bleed, lie down on your back, apply firm pressure to the area, and contact  your health care provider. ?Warmth. ?Pus or a bad smell. ?Driving ?Do not drive yourself home if you received sedation ?Do not drive for at least 4 days after your procedure or however long your health care

## 2021-05-28 NOTE — Progress Notes (Signed)
RN removed PIVs and went over d/c summary w/ pt and pt's family. Belongings with pt's wife, who is transporting pt home. Volunteer transporting pt to private vehicle.  ?

## 2021-05-31 ENCOUNTER — Telehealth: Payer: Self-pay

## 2021-05-31 NOTE — Telephone Encounter (Signed)
Left message to call back  

## 2021-06-01 NOTE — Telephone Encounter (Signed)
Left message to call back  

## 2021-06-02 NOTE — Telephone Encounter (Signed)
Left message to call back. ? ?The patient is scheduled for Watchman post-visit 06/23/2021. ?

## 2021-06-02 NOTE — Progress Notes (Signed)
Carelink Summary Report / Loop Recorder 

## 2021-06-08 DIAGNOSIS — H2513 Age-related nuclear cataract, bilateral: Secondary | ICD-10-CM | POA: Diagnosis not present

## 2021-06-08 DIAGNOSIS — H43813 Vitreous degeneration, bilateral: Secondary | ICD-10-CM | POA: Diagnosis not present

## 2021-06-08 DIAGNOSIS — H35373 Puckering of macula, bilateral: Secondary | ICD-10-CM | POA: Diagnosis not present

## 2021-06-08 DIAGNOSIS — H33321 Round hole, right eye: Secondary | ICD-10-CM | POA: Diagnosis not present

## 2021-06-16 DIAGNOSIS — D6869 Other thrombophilia: Secondary | ICD-10-CM | POA: Diagnosis not present

## 2021-06-16 DIAGNOSIS — I4892 Unspecified atrial flutter: Secondary | ICD-10-CM | POA: Diagnosis not present

## 2021-06-16 DIAGNOSIS — R7301 Impaired fasting glucose: Secondary | ICD-10-CM | POA: Diagnosis not present

## 2021-06-18 ENCOUNTER — Encounter: Payer: Self-pay | Admitting: Cardiology

## 2021-06-18 DIAGNOSIS — M6281 Muscle weakness (generalized): Secondary | ICD-10-CM | POA: Diagnosis not present

## 2021-06-18 DIAGNOSIS — Z95818 Presence of other cardiac implants and grafts: Secondary | ICD-10-CM

## 2021-06-18 DIAGNOSIS — M62838 Other muscle spasm: Secondary | ICD-10-CM | POA: Diagnosis not present

## 2021-06-18 DIAGNOSIS — I480 Paroxysmal atrial fibrillation: Secondary | ICD-10-CM

## 2021-06-21 ENCOUNTER — Ambulatory Visit (INDEPENDENT_AMBULATORY_CARE_PROVIDER_SITE_OTHER): Payer: Medicare Other

## 2021-06-21 DIAGNOSIS — H43393 Other vitreous opacities, bilateral: Secondary | ICD-10-CM | POA: Diagnosis not present

## 2021-06-21 DIAGNOSIS — I480 Paroxysmal atrial fibrillation: Secondary | ICD-10-CM

## 2021-06-21 LAB — CUP PACEART REMOTE DEVICE CHECK
Date Time Interrogation Session: 20230423231429
Implantable Pulse Generator Implant Date: 20210923

## 2021-06-22 ENCOUNTER — Encounter: Payer: Self-pay | Admitting: Cardiovascular Disease

## 2021-06-22 NOTE — Progress Notes (Signed)
?[facility]   ?                                  ?Cardiology Office Note:   ? ?Date:  06/23/2021  ? ?ID:  Lonnie Palmer, DOB 01-Dec-1955, MRN [id number] ? ?PCP:  Donald Prose, MD  ?[facility] Cardiologist:  Evalina Field, MD  ?[facility] Electrophysiologist:  Vickie Epley, MD  ? ?Referring MD: Donald Prose, MD  ? ?Chief Complaint  ?Patient presents with  ? Follow-up  ?  Post Watchman; Pre TEE   ? ?History of Present Illness:   ? ?Lonnie Palmer is a 66 y.o. male with a hx of Afib/AFL, CAD, HTN, and HLD who was referred to Dr. Quentin Ore for possible watchman placement after discussing avoidance of long term anticoagulation. He underwent AF ablation 01/03/2020 without recurrent arrhythmia. He was felt to be a good candidate for Watchman. He underwent Watchman implant Watchman FLX 8m. A repeat TEE at the 45 day mark was planned however the patient preferred to have a CT scan instead due to cervical spine issues. SBE discussed and has been RX'ed with Amoxicillin 2g PO at least one hour prior to dental cleanings and procedures for the next 6 months.  ? Anticoagulation plan: Continue Eliquis '5mg'$  PO BID and Aspirin '81mg'$  PO daily. If 45-day follow up TEE meets criteria, plan to stop Eliquis and start Plavix '75mg'$  PO daily to complete 6 months of post implant therapy.  ? ?Today he reports that he has been doing very well with no reported concerns today. He has been tolerating his medications without bleeding in stool or urine. No chest pain, palpitations, LE edema, SOB, orthopnea, dizziness, or syncope. His Toprol was recently increased to '50mg'$  in AM and '25mg'$  in PM with better rate control. EKG today with HR at 68bpm.  ? ?Past Medical History:  ?Diagnosis Date  ? [place] ([facility]   ? [facility] ([facility]   ? prostate  ? Coronary artery disease   ? Hyperlipidemia   ? Hypertension   ? Presence of Watchman left atrial appendage closure device 05/27/2021  ? s/p Watchman FLX 225mwith Dr. LaQuentin Ore ? ? ?Past Surgical History:  ?Procedure Laterality Date  ? APPENDECTOMY    ? ATRIAL FIBRILLATION ABLATION N/A 01/03/2020  ? Procedure: ATRIAL FIBRILLATION ABLATION;  Surgeon: LaVickie EpleyMD;  [facility];  Service: Cardiovascular;  Laterality: N/A;  ? HERNIA REPAIR    ? LEFT ATRIAL APPENDAGE OCCLUSION N/A 05/27/2021  ? Procedure: LEFT ATRIAL APPENDAGE OCCLUSION;  Surgeon: LaVickie EpleyMD;  [facility];  Service: Cardiovascular;  Laterality: N/A;  ? LOOP RECORDER INSERTION N/A 11/21/2019  ? Procedure: LOOP RECORDER INSERTION;  Surgeon: LaVickie EpleyMD;  [facility];  Service: Cardiovascular;  Laterality: N/A;  ? LUMBAR DISC SURGERY    ? LYMPHADENECTOMY Bilateral 09/10/2020  ? Procedure: LYMPHADENECTOMY, PELVIC;  Surgeon: BoRaynelle BringMD;  Location: [facility];  Service: Urology;  Laterality: Bilateral;  ? ROBOT ASSISTED LAPAROSCOPIC RADICAL PROSTATECTOMY N/A 09/10/2020  ? Procedure: XI ROBOTIC ASSISTED LAPAROSCOPIC RADICAL PROSTATECTOMY LEVEL 2;  Surgeon: BoRaynelle BringMD;  Location: [facility];  Service: Urology;  Laterality: N/A;  ? TEE WITHOUT CARDIOVERSION N/A 05/27/2021  ? Procedure: TRANSESOPHAGEAL ECHOCARDIOGRAM (TEE);  Surgeon: LaVickie EpleyMD;  [facility];  Service: Cardiovascular;  Laterality:  N/A;  ? VASECTOMY    ? WISDOM TOOTH EXTRACTION    ? ? ?Current Medications: ?Current Meds  ?Medication Sig  ? amoxicillin (AMOXIL) 500 MG capsule Take 4 capsules (2,000 mg total) by mouth as directed. Take 4 tablets 1 hour prior to dental work, including cleanings.  ? apixaban (ELIQUIS) 5 MG TABS tablet Take 1 tablet (5 mg total) by mouth 2 (two) times daily.  ? aspirin EC 81 MG tablet Take 1 tablet (81 mg total) by mouth daily. Swallow whole.  ? Coenzyme Q10 50 MG CAPS Take 100 mg by mouth at bedtime.  ? cyclobenzaprine (FLEXERIL) 10 MG tablet Take 10 mg by mouth at bedtime.   ? docusate sodium (COLACE) 100 MG capsule Take 1  capsule (100 mg total) by mouth 2 (two) times daily.  ? doxycycline (VIBRAMYCIN) 100 MG capsule Take 100 mg by mouth daily.  ? ezetimibe-simvastatin (VYTORIN) 10-20 MG tablet Take 1 tablet by mouth at bedtime.  ? HYDROcodone-acetaminophen (NORCO/VICODIN) 5-325 MG tablet Take 1-2 tablets by mouth every 6 (six) hours as needed for moderate pain.  ? lisinopril-hydrochlorothiazide (ZESTORETIC) 10-12.5 MG tablet Take 1 tablet by mouth daily.  ? metoprolol succinate (TOPROL-XL) 50 MG 24 hr tablet Take 1 tablet (50 mg total) by mouth daily. Take with or immediately following a meal. (Patient taking differently: Take 12-50 mg by mouth See admin instructions. Take 50 mg in the morning and 25 mg at bedtime)  ? Multiple Vitamins-Minerals (CENTRUM SILVER ADULT 50+) TABS Take 1 tablet by mouth daily.  ? neomycin-polymyxin b-dexamethasone (MAXITROL) 3.5-10000-0.1 OINT daily.  ? sildenafil (VIAGRA) 100 MG tablet Take 100 mg by mouth daily as needed for erectile dysfunction.  ?  ? ?Allergies:   Escitalopram  ? ?Social History  ? ?Socioeconomic History  ? Marital status: Married  ?  Spouse name: Not on file  ? Number of children: 1  ? Years of education: Not on file  ? Highest education level: Not on file  ?Occupational History  ? Occupation: Contractor  ?Tobacco Use  ? Smoking status: Former  ?  Packs/day: 0.50  ?  Years: 6.00  ?  Pack years: 3.00  ?  Types: Cigarettes  ?  Quit date: 61  ?  Years since quitting: 36.3  ? Smokeless tobacco: Never  ?Vaping Use  ? Vaping Use: Never used  ?Substance and Sexual Activity  ? Alcohol use: Never  ? Drug use: Never  ? Sexual activity: Not on file  ?Other Topics Concern  ? Not on file  ?Social History Narrative  ? Not on file  ? ?Social Determinants of Health  ? ?Financial Resource Strain: Not on file  ?Food Insecurity: Not on file  ?Transportation Needs: Not on file  ?Physical Activity: Not on file  ?Stress: Not on file  ?Social Connections: Not on file  ?  ? ?Family  History: ?The patient's family history includes Heart attack in his maternal grandfather. ? ?ROS:   ?Please see the history of present illness.    ?All other systems reviewed and are negative. ? ?EKGs/Labs/Other Studies Reviewed:   ? ?The following studies were reviewed today: ? ?Watchman implant 05/27/21: ?  ?Procedures This Admission:  ?  ?CONCLUSIONS:  ?1.Successful implantation of a WATCHMAN left atrial appendage occlusive device    ?2. TEE demonstrating no LAA thrombus ?3. No early apparent complications.  ?  ?Post Implant Anticoagulation Strategy: ?Continue Eliquis '5mg'$  PO BID and Aspirin '81mg'$  PO daily. If 45-day follow up  TEE meets criteria, plan to stop Eliquis and start Plavix '75mg'$  PO daily to complete 6 months of post implant therapy.  ? ? ?EKG:  EKG is  ordered today.  The ekg ordered today demonstrates NSR with HR 68bpm ? ?Recent Labs: ?05/24/2021: Hemoglobin 15.6; Platelets 223 ?05/28/2021: BUN 18; Creatinine, Ser 1.10; Potassium 4.1; Sodium 136  ?Recent Lipid Panel ?No results found for: CHOL, TRIG, HDL, CHOLHDL, VLDL, LDLCALC, LDLDIRECT ? ?Physical Exam:   ? ?VS:  BP 111/73   Pulse 68   Ht '6\' 1"'$  (1.854 m)   Wt 177 lb (80.3 kg)   SpO2 98%   BMI 23.35 kg/m?    ? ?Wt Readings from Last 3 Encounters:  ?06/23/21 177 lb (80.3 kg)  ?05/27/21 179 lb (81.2 kg)  ?05/18/21 178 lb 9.6 oz (81 kg)  ?  ?General: Well developed, well nourished, NAD ?Lungs:Clear to ausculation bilaterally. No wheezes, rales, or rhonchi. Breathing is unlabored. ?Cardiovascular: RRR with S1 S2. No murmurs ?Extremities: No edema.  ?Neuro: Alert and oriented. No focal deficits. No facial asymmetry. MAE spontaneously. ?Psych: Responds to questions appropriately with normal affect.   ? ?ASSESSMENT/PLAN:   ? ?Paroxysmal atrial fibrillation: s/p ablation with Dr. Quentin Ore. He underwent Watchman implant with 71m device 05/27/21. Continue Eliquis '5mg'$  PO BID and Aspirin '81mg'$  PO daily. If 45-day follow up CT meets criteria, plan to stop Eliquis  and start Plavix '75mg'$  PO daily to complete 6 months of post implant therapy. SBE re-discussed today with understanding. Obtain BMET today. HR should be ok. Toprol recently increased to '75mg'$  total per day with HR impr

## 2021-06-23 ENCOUNTER — Ambulatory Visit: Payer: Medicare Other | Admitting: Cardiology

## 2021-06-23 VITALS — BP 111/73 | HR 68 | Ht 73.0 in | Wt 177.0 lb

## 2021-06-23 DIAGNOSIS — I2510 Atherosclerotic heart disease of native coronary artery without angina pectoris: Secondary | ICD-10-CM | POA: Diagnosis not present

## 2021-06-23 DIAGNOSIS — Z95818 Presence of other cardiac implants and grafts: Secondary | ICD-10-CM

## 2021-06-23 DIAGNOSIS — I480 Paroxysmal atrial fibrillation: Secondary | ICD-10-CM | POA: Diagnosis not present

## 2021-06-23 NOTE — Patient Instructions (Signed)
Medication Instructions:  ?Your physician recommends that you continue on your current medications as directed. Please refer to the Current Medication list given to you today.  ?*If you need a refill on your cardiac medications before your next appointment, please call your pharmacy* ? ? ?Lab Work: ?TODAY: BMET ?If you have labs (blood work) drawn today and your tests are completely normal, you will receive your results only by: ?MyChart Message (if you have MyChart) OR ?A paper copy in the mail ?If you have any lab test that is abnormal or we need to change your treatment, we will call you to review the results. ? ? ?Testing/Procedures: ?SEE INSTRUCTION LETTER FOR YOUR CT SCAN ? ? ?Follow-Up: ?At [facility], you and your health needs are our priority.  As part of our continuing mission to provide you with exceptional heart care, we have created designated Provider Care Teams.  These Care Teams include your primary Cardiologist (physician) and Advanced Practice Providers (APPs -  Physician Assistants and Nurse Practitioners) who all work together to provide you with the care you need, when you need it. ? ?We recommend signing up for the patient portal called "MyChart".  Sign up information is provided on this After Visit Summary.  MyChart is used to connect with patients for Virtual Visits (Telemedicine).  Patients are able to view lab/test results, encounter notes, upcoming appointments, etc.  Non-urgent messages can be sent to your provider as well.   ?To learn more about what you can do with MyChart, go to [email or web address].   ? ?Your next appointment:   ?KEEP SCHEDULED FOLLOW-UP ? ?Important Information About Sugar ? ? ? ? ?  ?

## 2021-06-24 LAB — BASIC METABOLIC PANEL
BUN/Creatinine Ratio: 16 (ref 10–24)
BUN: 18 mg/dL (ref 8–27)
CO2: 24 mmol/L (ref 20–29)
Calcium: 9.7 mg/dL (ref 8.6–10.2)
Chloride: 100 mmol/L (ref 96–106)
Creatinine, Ser: 1.16 mg/dL (ref 0.76–1.27)
Glucose: 94 mg/dL (ref 70–99)
Potassium: 4.7 mmol/L (ref 3.5–5.2)
Sodium: 143 mmol/L (ref 134–144)
eGFR: 70 mL/min/{1.73_m2} (ref >59)

## 2021-06-24 MED ORDER — METOPROLOL SUCCINATE ER 50 MG PO TB24
ORAL_TABLET | ORAL | 3 refills | Status: DC
Start: 2021-06-24 — End: 2022-06-16

## 2021-07-02 DIAGNOSIS — Z0000 Encounter for general adult medical examination without abnormal findings: Secondary | ICD-10-CM | POA: Diagnosis not present

## 2021-07-02 DIAGNOSIS — I10 Essential (primary) hypertension: Secondary | ICD-10-CM | POA: Diagnosis not present

## 2021-07-02 DIAGNOSIS — M549 Dorsalgia, unspecified: Secondary | ICD-10-CM | POA: Diagnosis not present

## 2021-07-02 DIAGNOSIS — I4892 Unspecified atrial flutter: Secondary | ICD-10-CM | POA: Diagnosis not present

## 2021-07-02 DIAGNOSIS — Z1389 Encounter for screening for other disorder: Secondary | ICD-10-CM | POA: Diagnosis not present

## 2021-07-02 DIAGNOSIS — D6869 Other thrombophilia: Secondary | ICD-10-CM | POA: Diagnosis not present

## 2021-07-02 DIAGNOSIS — E785 Hyperlipidemia, unspecified: Secondary | ICD-10-CM | POA: Diagnosis not present

## 2021-07-07 NOTE — Progress Notes (Signed)
Carelink Summary Report / Loop Recorder 

## 2021-07-08 ENCOUNTER — Telehealth (HOSPITAL_COMMUNITY): Payer: Self-pay | Admitting: Emergency Medicine

## 2021-07-08 NOTE — Telephone Encounter (Signed)
Attempted to call patient regarding upcoming cardiac CT appointment. °Left message on voicemail with name and callback number °Chenell Lozon RN Navigator Cardiac Imaging °[facility] °[phone] Office °[phone] Cell ° °

## 2021-07-09 ENCOUNTER — Ambulatory Visit (HOSPITAL_COMMUNITY)
Admission: RE | Admit: 2021-07-09 | Discharge: 2021-07-09 | Disposition: A | Discharge status: Home / Self Care | Payer: Medicare Other | Source: Ambulatory Visit | Attending: Cardiology | Admitting: Cardiology

## 2021-07-09 DIAGNOSIS — I480 Paroxysmal atrial fibrillation: Secondary | ICD-10-CM | POA: Diagnosis not present

## 2021-07-09 DIAGNOSIS — Z95818 Presence of other cardiac implants and grafts: Secondary | ICD-10-CM | POA: Diagnosis not present

## 2021-07-09 MED ORDER — IOHEXOL 350 MG/ML SOLN
100.0000 mL | Freq: Once | INTRAVENOUS | Status: AC | PRN
  Administered 2021-07-09: 100 mL via INTRAVENOUS

## 2021-07-12 MED ORDER — CLOPIDOGREL BISULFATE 75 MG PO TABS: 75.0000 mg | ORAL_TABLET | Freq: Every day | ORAL | 1 refills | Status: DC

## 2021-07-16 ENCOUNTER — Ambulatory Visit (HOSPITAL_COMMUNITY): Payer: Medicare Other

## 2021-07-16 ENCOUNTER — Ambulatory Visit (HOSPITAL_COMMUNITY): Admit: 2021-07-16 | Payer: Medicare Other | Admitting: Cardiovascular Disease

## 2021-07-16 ENCOUNTER — Encounter (HOSPITAL_COMMUNITY): Payer: Self-pay

## 2021-07-16 SURGERY — ECHOCARDIOGRAM, TRANSESOPHAGEAL
Anesthesia: Monitor Anesthesia Care

## 2021-07-18 ENCOUNTER — Encounter: Payer: Self-pay | Admitting: Cardiovascular Disease

## 2021-07-19 ENCOUNTER — Encounter: Payer: Self-pay | Admitting: Cardiology

## 2021-07-19 NOTE — Telephone Encounter (Signed)
Loop recorder transmission 07/19/21 0600AM no episodes, some parameters off, implanted for suspected AF

## 2021-07-21 ENCOUNTER — Ambulatory Visit: Payer: Medicare Other | Admitting: Cardiovascular Disease

## 2021-07-21 ENCOUNTER — Encounter: Payer: Self-pay | Admitting: Cardiovascular Disease

## 2021-07-21 VITALS — BP 116/66 | HR 71 | Ht 73.0 in | Wt 180.8 lb

## 2021-07-21 DIAGNOSIS — I483 Typical atrial flutter: Secondary | ICD-10-CM

## 2021-07-21 DIAGNOSIS — R0602 Shortness of breath: Secondary | ICD-10-CM | POA: Diagnosis not present

## 2021-07-21 DIAGNOSIS — I480 Paroxysmal atrial fibrillation: Secondary | ICD-10-CM | POA: Diagnosis not present

## 2021-07-21 DIAGNOSIS — Z95818 Presence of other cardiac implants and grafts: Secondary | ICD-10-CM

## 2021-07-21 DIAGNOSIS — M62838 Other muscle spasm: Secondary | ICD-10-CM | POA: Diagnosis not present

## 2021-07-21 DIAGNOSIS — M6281 Muscle weakness (generalized): Secondary | ICD-10-CM | POA: Diagnosis not present

## 2021-07-21 NOTE — Progress Notes (Signed)
Cardiology Office Note:   Date:  07/21/2021  NAME:  Lonnie Palmer    MRN: [id number] DOB:  May 25, 1955   PCP:  Donald Prose, MD  Cardiologist:  Evalina Field, MD  Electrophysiologist:  Vickie Epley, MD   Referring MD: Donald Prose, MD   Chief Complaint  Patient presents with   Shortness of Breath    History of Present Illness:   BABAK LUCUS is a 66 y.o. male with a hx of pAF s/p ablation, watchman, CAD, HTN, HLD who presents for follow-up.  He reports over the past 1 week he has had worsening shortness of breath with exertion.  He describes climbing flights of stairs at work.  Reportedly able to do this before it this past week.  He got to the top of the stairs and was short of breath.  He also played golf this weekend.  He reports walking the hills he was unable to do this very well.  He describes shortness of breath.  No weight gain.  No increased edema.  No chest tightness or pressure.  He reports that he has been able to climb the stairs for the past few months without issues.  He has been fairly inactive due to recent Watchman procedure as well as cardiac ablation.  He reports some of this could be related to deconditioning.  His blood pressure is well controlled.  No rapid heartbeat sensation.  EKG demonstrates sinus rhythm with right bundle branch block which is unchanged.  He has a loop recorder.  No A-fib has been found.  Symptoms are alarming.  Again no chest pain or trouble breathing with regular activity.  No chest tightness but the symptoms are mainly exertional shortness of breath.  Problem List 1. HTN 2. HLD -Total cholesterol 122, HDL 54, LDL 44, triglycerides 144 3. PACs -23.3% PAC burden 4.  Atrial flutter/fibrillation  -11/15/2019 -CHADSVASC=2 (HTN, CAD) -s/p AFL/AF ablation 01/03/2020 -27 mm Watchman FLX 05/27/2021 5. CAD -CAC score 385 (82nd percentile)  Past Medical History: Past Medical History:  Diagnosis Date   Atrial fibrillation ([place])     Cancer ([facility])    prostate   Coronary artery disease    Hyperlipidemia    Hypertension    Presence of Watchman left atrial appendage closure device 05/27/2021   s/p Watchman FLX 79m with Dr. LQuentin Ore   Past Surgical History: Past Surgical History:  Procedure Laterality Date   APPENDECTOMY     ATRIAL FIBRILLATION ABLATION N/A 01/03/2020   Procedure: ATRIAL FIBRILLATION ABLATION;  Surgeon: LVickie Epley MD;  [facility];  Service: Cardiovascular;  Laterality: N/A;   HERNIA REPAIR     LEFT ATRIAL APPENDAGE OCCLUSION N/A 05/27/2021   Procedure: LEFT ATRIAL APPENDAGE OCCLUSION;  Surgeon: LVickie Epley MD;  [place];  Service: Cardiovascular;  Laterality: N/A;   LOOP RECORDER INSERTION N/A 11/21/2019   Procedure: LOOP RECORDER INSERTION;  Surgeon: LVickie Epley MD;  [place];  Service: Cardiovascular;  Laterality: N/A;   LUMBAR DISC SURGERY     LYMPHADENECTOMY Bilateral 09/10/2020   Procedure: LYMPHADENECTOMY, PELVIC;  Surgeon: BRaynelle Bring MD;  [facility];  Service: Urology;  Laterality: Bilateral;   ROBOT ASSISTED LAPAROSCOPIC RADICAL PROSTATECTOMY N/A 09/10/2020   Procedure: XI ROBOTIC ASSISTED LAPAROSCOPIC RADICAL PROSTATECTOMY LEVEL 2;  Surgeon: BRaynelle Bring MD;  [facility];  Service: Urology;  Laterality: N/A;   TEE WITHOUT CARDIOVERSION N/A 05/27/2021   Procedure: TRANSESOPHAGEAL  ECHOCARDIOGRAM (TEE);  Surgeon: Vickie Epley, MD;  Location: [place] [facility] LAB;  Service: Cardiovascular;  Laterality: N/A;   VASECTOMY     WISDOM TOOTH EXTRACTION      Current Medications: Current Meds  Medication Sig   amoxicillin (AMOXIL) 500 MG capsule Take 4 capsules (2,000 mg total) by mouth as directed. Take 4 tablets 1 hour prior to dental work, including cleanings.   aspirin EC 81 MG tablet Take 1 tablet (81 mg total) by mouth daily. Swallow whole.   clopidogrel (PLAVIX) 75 MG tablet Take 1 tablet (75  mg total) by mouth daily.   Coenzyme Q10 50 MG CAPS Take 100 mg by mouth at bedtime.   cyclobenzaprine (FLEXERIL) 10 MG tablet Take 10 mg by mouth at bedtime.    docusate sodium (COLACE) 100 MG capsule Take 1 capsule (100 mg total) by mouth 2 (two) times daily.   ezetimibe-simvastatin (VYTORIN) 10-20 MG tablet Take 1 tablet by mouth at bedtime.   HYDROcodone-acetaminophen (NORCO/VICODIN) 5-325 MG tablet Take 1-2 tablets by mouth every 6 (six) hours as needed for moderate pain.   lisinopril-hydrochlorothiazide (ZESTORETIC) 10-12.5 MG tablet Take 1 tablet by mouth daily.   metoprolol succinate (TOPROL-XL) 50 MG 24 hr tablet Take 1 tablet in the morning and 1/2 tablet in the evening Take with or immediately following a meal.   Multiple Vitamins-Minerals (CENTRUM SILVER ADULT 50+) TABS Take 1 tablet by mouth daily.   sildenafil (VIAGRA) 100 MG tablet Take 100 mg by mouth daily as needed for erectile dysfunction.   UNABLE TO FIND Med Name: Trimix Injections for erectile dysfunction as needed.     Allergies:    Escitalopram   Social History: Social History   Socioeconomic History   Marital status: Married    Spouse name: Not on file   Number of children: 1   Years of education: Not on file   Highest education level: Not on file  Occupational History   Occupation: Contractor  Tobacco Use   Smoking status: Former    Packs/day: 0.50    Years: 6.00    Pack years: 3.00    Types: Cigarettes    Quit date: 1987    Years since quitting: 36.4   Smokeless tobacco: Never  Vaping Use   Vaping Use: Never used  Substance and Sexual Activity   Alcohol use: Never   Drug use: Never   Sexual activity: Not on file  Other Topics Concern   Not on file  Social History Narrative   Not on file   Social Determinants of Health   Financial Resource Strain: Not on file  Food Insecurity: Not on file  Transportation Needs: Not on file  Physical Activity: Not on file  Stress: Not on file   Social Connections: Not on file     Family History: The patient's family history includes Heart attack in his maternal grandfather.  ROS:   All other ROS reviewed and negative. Pertinent positives noted in the HPI.     EKGs/Labs/Other Studies Reviewed:   The following studies were personally reviewed by me today:  EKG:  EKG is ordered today.  The ekg ordered today demonstrates normal sinus rhythm heart rate 71, right bundle branch block, left anterior fascicular block, and was personally reviewed by me.   Recent Labs: 05/24/2021: Hemoglobin 15.6; Platelets 223 06/23/2021: BUN 18; Creatinine, Ser 1.16; Potassium 4.7; Sodium 143   Recent Lipid Panel No results found for: CHOL, TRIG, HDL, CHOLHDL, VLDL, LDLCALC,  LDLDIRECT  Physical Exam:   VS:  BP 116/66   Pulse 71   Ht '6\' 1"'$  (1.854 m)   Wt 180 lb 12.8 oz (82 kg)   SpO2 95%   BMI 23.85 kg/m    Wt Readings from Last 3 Encounters:  07/21/21 180 lb 12.8 oz (82 kg)  06/23/21 177 lb (80.3 kg)  05/27/21 179 lb (81.2 kg)    General: Well nourished, well developed, in no acute distress Head: Atraumatic, normal size  Eyes: PEERLA, EOMI  Neck: Supple, no JVD Endocrine: No thryomegaly Cardiac: Normal S1, S2; RRR; no murmurs, rubs, or gallops Lungs: Clear to auscultation bilaterally, no wheezing, rhonchi or rales  Abd: Soft, nontender, no hepatomegaly  Ext: No edema, pulses 2+ Musculoskeletal: No deformities, BUE and BLE strength normal and equal Skin: Warm and dry, no rashes   Neuro: Alert and oriented to person, place, time, and situation, CNII-XII grossly intact, no focal deficits  Psych: Normal mood and affect   ASSESSMENT:   NATALIE LECLAIRE is a 66 y.o. male who presents for the following: 1. SOB (shortness of breath) on exertion   2. Paroxysmal atrial fibrillation (HCC)   3. Typical atrial flutter ([place])   4. Presence of Watchman left atrial appendage closure device     PLAN:   1. SOB (shortness of breath) on  exertion -Exertional shortness of breath.  No evidence of volume overload.  EKG shows he is maintaining sinus rhythm.  No evidence of atrial fibrillation.  He describes no symptoms concerning for angina.  He does have an elevated calcium score.  He is euvolemic on examination.  He reports he has been not that active over the past 2 years.  I suspect some of this could be deconditioning.  I would like to check an echocardiogram given recent Watchman procedure.  I have a low suspicion for any pericardial effusion or any change.  If this is negative and symptoms do not improve would recommend a stress test.  For now I think deconditioning is a big component.  We will start with an echocardiogram.  2. Paroxysmal atrial fibrillation ([place]) 3. Typical atrial flutter ([place]) 4. Presence of Watchman left atrial appendage closure device -Status post A-fib and flutter ablation.  EKG shows she is maintaining sinus rhythm.  Also with loop recorder which shows no atrial fibrillation.  Status post Watchman procedure.  Most recent CT with delayed endothelialization.  Okay to come off anticoagulation by me.  He is already done so.  We will continue with aspirin and Plavix until guided further by electrophysiology.  Overall he has done well.  We will continue metoprolol for now.  Of note he does have a right bundle branch block and left anterior fascicular block.  May need to be cautious with his beta-blocker.  We can interrogate his loop recorder for any bradycardia episodes.  Disposition: No follow-ups on file.  Medication Adjustments/Labs and Tests Ordered: Current medicines are reviewed at length with the patient today.  Concerns regarding medicines are outlined above.  Orders Placed This Encounter  Procedures   EKG 12-Lead   ECHOCARDIOGRAM COMPLETE   No orders of the defined types were placed in this encounter.   Patient Instructions  Medication Instructions:  The current medical regimen is effective;   continue present plan and medications.  *If you need a refill on your cardiac medications before your next appointment, please call your pharmacy*   Testing/Procedures:  Echocardiogram - Your physician has requested that you have  an echocardiogram. Echocardiography is a painless test that uses sound waves to create images of your heart. It provides your doctor with information about the size and shape of your heart and how well your heart's chambers and valves are working. This procedure takes approximately one hour. There are no restrictions for this procedure.     Follow-Up: At [facility], you and your health needs are our priority.  As part of our continuing mission to provide you with exceptional heart care, we have created designated Provider Care Teams.  These Care Teams include your primary Cardiologist (physician) and Advanced Practice Providers (APPs -  Physician Assistants and Nurse Practitioners) who all work together to provide you with the care you need, when you need it.  We recommend signing up for the patient portal called "MyChart".  Sign up information is provided on this After Visit Summary.  MyChart is used to connect with patients for Virtual Visits (Telemedicine).  Patients are able to view lab/test results, encounter notes, upcoming appointments, etc.  Non-urgent messages can be sent to your provider as well.   To learn more about what you can do with MyChart, go to [email or web address].    Your next appointment:   3 month(s)  The format for your next appointment:   In Person  Provider:   Evalina Field, MD             Time Spent with Patient: I have spent a total of 35 minutes with patient reviewing hospital notes, telemetry, EKGs, labs and examining the patient as well as establishing an assessment and plan that was discussed with the patient.  > 50% of time was spent in direct patient care.  Signed, Addison Naegeli. Audie Box, MD, [place]  [address], [place], [place] [address] [phone]  07/21/2021 4:45 PM

## 2021-07-21 NOTE — Patient Instructions (Addendum)
Medication Instructions:  The current medical regimen is effective;  continue present plan and medications.  *If you need a refill on your cardiac medications before your next appointment, please call your pharmacy*   Testing/Procedures:  Echocardiogram - Your physician has requested that you have an echocardiogram. Echocardiography is a painless test that uses sound waves to create images of your heart. It provides your doctor with information about the size and shape of your heart and how well your heart's chambers and valves are working. This procedure takes approximately one hour. There are no restrictions for this procedure.     Follow-Up: At [facility], you and your health needs are our priority.  As part of our continuing mission to provide you with exceptional heart care, we have created designated Provider Care Teams.  These Care Teams include your primary Cardiologist (physician) and Advanced Practice Providers (APPs -  Physician Assistants and Nurse Practitioners) who all work together to provide you with the care you need, when you need it.  We recommend signing up for the patient portal called "MyChart".  Sign up information is provided on this After Visit Summary.  MyChart is used to connect with patients for Virtual Visits (Telemedicine).  Patients are able to view lab/test results, encounter notes, upcoming appointments, etc.  Non-urgent messages can be sent to your provider as well.   To learn more about what you can do with MyChart, go to [email or web address].    Your next appointment:   3 month(s)  The format for your next appointment:   In Person  Provider:   Evalina Field, MD

## 2021-07-27 ENCOUNTER — Ambulatory Visit (INDEPENDENT_AMBULATORY_CARE_PROVIDER_SITE_OTHER): Payer: Medicare Other

## 2021-07-27 DIAGNOSIS — I483 Typical atrial flutter: Secondary | ICD-10-CM

## 2021-07-28 LAB — CUP PACEART REMOTE DEVICE CHECK
Date Time Interrogation Session: 20230526230607
Implantable Pulse Generator Implant Date: 20210923

## 2021-08-02 DIAGNOSIS — H2513 Age-related nuclear cataract, bilateral: Secondary | ICD-10-CM | POA: Diagnosis not present

## 2021-08-02 DIAGNOSIS — H43393 Other vitreous opacities, bilateral: Secondary | ICD-10-CM | POA: Diagnosis not present

## 2021-08-02 DIAGNOSIS — H33321 Round hole, right eye: Secondary | ICD-10-CM | POA: Diagnosis not present

## 2021-08-02 DIAGNOSIS — H43813 Vitreous degeneration, bilateral: Secondary | ICD-10-CM | POA: Diagnosis not present

## 2021-08-02 DIAGNOSIS — H35373 Puckering of macula, bilateral: Secondary | ICD-10-CM | POA: Diagnosis not present

## 2021-08-10 ENCOUNTER — Ambulatory Visit (HOSPITAL_COMMUNITY): Payer: Medicare Other | Attending: Cardiology

## 2021-08-10 DIAGNOSIS — R0602 Shortness of breath: Secondary | ICD-10-CM | POA: Diagnosis not present

## 2021-08-10 LAB — ECHOCARDIOGRAM COMPLETE
Area-P 1/2: 2.96 cm2
S' Lateral: 2.5 cm

## 2021-08-11 ENCOUNTER — Encounter: Payer: Self-pay | Admitting: Cardiovascular Disease

## 2021-08-11 NOTE — Progress Notes (Signed)
Carelink Summary Report / Loop Recorder 

## 2021-08-30 ENCOUNTER — Ambulatory Visit (INDEPENDENT_AMBULATORY_CARE_PROVIDER_SITE_OTHER): Payer: Medicare Other

## 2021-08-30 DIAGNOSIS — I483 Typical atrial flutter: Secondary | ICD-10-CM | POA: Diagnosis not present

## 2021-08-31 LAB — CUP PACEART REMOTE DEVICE CHECK
Date Time Interrogation Session: 20230702231144
Implantable Pulse Generator Implant Date: 20210923

## 2021-09-21 NOTE — Progress Notes (Signed)
Carelink Summary Report / Loop Recorder 

## 2021-09-22 DIAGNOSIS — M62838 Other muscle spasm: Secondary | ICD-10-CM | POA: Diagnosis not present

## 2021-09-22 DIAGNOSIS — M6281 Muscle weakness (generalized): Secondary | ICD-10-CM | POA: Diagnosis not present

## 2021-09-30 DIAGNOSIS — H00022 Hordeolum internum right lower eyelid: Secondary | ICD-10-CM | POA: Diagnosis not present

## 2021-10-04 ENCOUNTER — Ambulatory Visit (INDEPENDENT_AMBULATORY_CARE_PROVIDER_SITE_OTHER): Payer: Medicare Other

## 2021-10-04 DIAGNOSIS — I483 Typical atrial flutter: Secondary | ICD-10-CM

## 2021-10-04 LAB — CUP PACEART REMOTE DEVICE CHECK
Date Time Interrogation Session: 20230804231521
Implantable Pulse Generator Implant Date: 20210923

## 2021-10-07 DIAGNOSIS — H10022 Other mucopurulent conjunctivitis, left eye: Secondary | ICD-10-CM | POA: Diagnosis not present

## 2021-10-14 DIAGNOSIS — B309 Viral conjunctivitis, unspecified: Secondary | ICD-10-CM | POA: Diagnosis not present

## 2021-10-14 DIAGNOSIS — H00024 Hordeolum internum left upper eyelid: Secondary | ICD-10-CM | POA: Diagnosis not present

## 2021-10-14 DIAGNOSIS — H00022 Hordeolum internum right lower eyelid: Secondary | ICD-10-CM | POA: Diagnosis not present

## 2021-10-14 DIAGNOSIS — H10023 Other mucopurulent conjunctivitis, bilateral: Secondary | ICD-10-CM | POA: Diagnosis not present

## 2021-10-20 DIAGNOSIS — L814 Other melanin hyperpigmentation: Secondary | ICD-10-CM | POA: Diagnosis not present

## 2021-10-20 DIAGNOSIS — L578 Other skin changes due to chronic exposure to nonionizing radiation: Secondary | ICD-10-CM | POA: Diagnosis not present

## 2021-10-20 DIAGNOSIS — L821 Other seborrheic keratosis: Secondary | ICD-10-CM | POA: Diagnosis not present

## 2021-10-20 DIAGNOSIS — D225 Melanocytic nevi of trunk: Secondary | ICD-10-CM | POA: Diagnosis not present

## 2021-10-21 NOTE — Progress Notes (Signed)
Cardiology Office Note:   Date:  10/22/2021  NAME:  Lonnie Palmer    MRN: [id number] DOB:  1955-03-03   PCP:  Donald Prose, MD  Cardiologist:  Evalina Field, MD  Electrophysiologist:  Vickie Epley, MD   Referring MD: Donald Prose, MD   Chief Complaint  Patient presents with   Follow-up    3    History of Present Illness:   Lonnie Palmer is a 66 y.o. male with a hx of Afib/AFL, CAD who presents for follow-up.   Overall reports he is doing well.  His shortness of breath is better.  Echocardiogram was normal.  Suspect this was deconditioning.  He is still on aspirin and Plavix.  Should be coming off of this next month.  Blood pressure is well controlled.  Lipids are well controlled.  Current medication seems to be doing the job.  No symptoms of angina.  Problem List 1. HTN 2. HLD -Total chol 144, HDL 53, LDL 65, triglycerides 150 3. PACs -23.3% PAC burden 4.  Atrial flutter/fibrillation  -11/15/2019 -CHADSVASC=2 (HTN, CAD) -s/p AFL/AF ablation 01/03/2020 -27 mm Watchman FLX 05/27/2021 5. CAD -CAC score 385 (82nd percentile)  Past Medical History: Past Medical History:  Diagnosis Date   Atrial fibrillation ([place])    Cancer ([place])    prostate   Coronary artery disease    Hyperlipidemia    Hypertension    Presence of Watchman left atrial appendage closure device 05/27/2021   s/p Watchman FLX 39m with Dr. LQuentin Ore   Past Surgical History: Past Surgical History:  Procedure Laterality Date   APPENDECTOMY     ATRIAL FIBRILLATION ABLATION N/A 01/03/2020   Procedure: ATRIAL FIBRILLATION ABLATION;  Surgeon: LVickie Epley MD;  [facility];  Service: Cardiovascular;  Laterality: N/A;   HERNIA REPAIR     LEFT ATRIAL APPENDAGE OCCLUSION N/A 05/27/2021   Procedure: LEFT ATRIAL APPENDAGE OCCLUSION;  Surgeon: LVickie Epley MD;  [place] [facility];  Service: Cardiovascular;  Laterality: N/A;   LOOP RECORDER INSERTION N/A 11/21/2019    Procedure: LOOP RECORDER INSERTION;  Surgeon: LVickie Epley MD;  [facility];  Service: Cardiovascular;  Laterality: N/A;   LUMBAR DISC SURGERY     LYMPHADENECTOMY Bilateral 09/10/2020   Procedure: LYMPHADENECTOMY, PELVIC;  Surgeon: BRaynelle Bring MD;  [facility]: [facility];  Service: Urology;  Laterality: Bilateral;   ROBOT ASSISTED LAPAROSCOPIC RADICAL PROSTATECTOMY N/A 09/10/2020   Procedure: XI ROBOTIC ASSISTED LAPAROSCOPIC RADICAL PROSTATECTOMY LEVEL 2;  Surgeon: BRaynelle Bring MD;  Location: [facility];  Service: Urology;  Laterality: N/A;   TEE WITHOUT CARDIOVERSION N/A 05/27/2021   Procedure: TRANSESOPHAGEAL ECHOCARDIOGRAM (TEE);  Surgeon: LVickie Epley MD;  [facility];  Service: Cardiovascular;  Laterality: N/A;   VASECTOMY     WISDOM TOOTH EXTRACTION      Current Medications: Current Meds  Medication Sig   amoxicillin (AMOXIL) 500 MG capsule Take 4 capsules (2,000 mg total) by mouth as directed. Take 4 tablets 1 hour prior to dental work, including cleanings.   aspirin EC 81 MG tablet Take 1 tablet (81 mg total) by mouth daily. Swallow whole.   clopidogrel (PLAVIX) 75 MG tablet Take 1 tablet (75 mg total) by mouth daily.   Coenzyme Q10 50 MG CAPS Take 100 mg by mouth at bedtime.   cyclobenzaprine (FLEXERIL) 10 MG tablet Take 10 mg by mouth at bedtime.    docusate sodium (COLACE) 100 MG  capsule Take 1 capsule (100 mg total) by mouth 2 (two) times daily.   ezetimibe-simvastatin (VYTORIN) 10-20 MG tablet Take 1 tablet by mouth at bedtime.   HYDROcodone-acetaminophen (NORCO/VICODIN) 5-325 MG tablet Take 1-2 tablets by mouth every 6 (six) hours as needed for moderate pain.   lisinopril-hydrochlorothiazide (ZESTORETIC) 10-12.5 MG tablet Take 1 tablet by mouth daily.   metoprolol succinate (TOPROL-XL) 50 MG 24 hr tablet Take 1 tablet in the morning and 1/2 tablet in the evening Take with or immediately following a meal.   Multiple Vitamins-Minerals  (CENTRUM SILVER ADULT 50+) TABS Take 1 tablet by mouth daily.   sildenafil (VIAGRA) 100 MG tablet Take 100 mg by mouth daily as needed for erectile dysfunction.   UNABLE TO FIND Med Name: Trimix Injections for erectile dysfunction as needed.     Allergies:    Escitalopram   Social History: Social History   Socioeconomic History   Marital status: Married    Spouse name: Not on file   Number of children: 1   Years of education: Not on file   Highest education level: Not on file  Occupational History   Occupation: Contractor  Tobacco Use   Smoking status: Former    Packs/day: 0.50    Years: 6.00    Total pack years: 3.00    Types: Cigarettes    Quit date: 1987    Years since quitting: 36.6   Smokeless tobacco: Never  Vaping Use   Vaping Use: Never used  Substance and Sexual Activity   Alcohol use: Never   Drug use: Never   Sexual activity: Not on file  Other Topics Concern   Not on file  Social History Narrative   Not on file   Social Determinants of Health   Financial Resource Strain: Not on file  Food Insecurity: Not on file  Transportation Needs: Not on file  Physical Activity: Not on file  Stress: Not on file  Social Connections: Not on file     Family History: The patient's family history includes Heart attack in his maternal grandfather.  ROS:   All other ROS reviewed and negative. Pertinent positives noted in the HPI.     EKGs/Labs/Other Studies Reviewed:   The following studies were personally reviewed by me today:  TTE 08/10/2021  1. Left ventricular ejection fraction, by estimation, is 60 to 65%. The  left ventricle has normal function. The left ventricle has no regional  wall motion abnormalities. Left ventricular diastolic parameters were  normal.   2. Right ventricular systolic function is normal. The right ventricular  size is normal.   3. Shadowing seen from Watchman device, new since last TTE. Unable to  fully visualize  device.   4. The mitral valve is normal in structure. Mild mitral valve  regurgitation. No evidence of mitral stenosis.   5. The aortic valve is tricuspid. Aortic valve regurgitation is not  visualized. No aortic stenosis is present.   6. The inferior vena cava is normal in size with greater than 50%  respiratory variability, suggesting right atrial pressure of 3 mmHg.   7. Cannot exclude a small PFO.   Recent Labs: 05/24/2021: Hemoglobin 15.6; Platelets 223 06/23/2021: BUN 18; Creatinine, Ser 1.16; Potassium 4.7; Sodium 143   Recent Lipid Panel No results found for: "CHOL", "TRIG", "HDL", "CHOLHDL", "VLDL", "LDLCALC", "LDLDIRECT"  Physical Exam:   VS:  BP 116/78 (BP Location: Left Arm, Patient Position: Sitting, Cuff Size: Normal)   Pulse 73  Ht '6\' 1"'$  (1.854 m)   Wt 179 lb (81.2 kg)   BMI 23.62 kg/m    Wt Readings from Last 3 Encounters:  10/22/21 179 lb (81.2 kg)  07/21/21 180 lb 12.8 oz (82 kg)  06/23/21 177 lb (80.3 kg)    General: Well nourished, well developed, in no acute distress Head: Atraumatic, normal size  Eyes: PEERLA, EOMI  Neck: Supple, no JVD Endocrine: No thryomegaly Cardiac: Normal S1, S2; RRR; no murmurs, rubs, or gallops Lungs: Clear to auscultation bilaterally, no wheezing, rhonchi or rales  Abd: Soft, nontender, no hepatomegaly  Ext: No edema, pulses 2+ Musculoskeletal: No deformities, BUE and BLE strength normal and equal Skin: Warm and dry, no rashes   Neuro: Alert and oriented to person, place, time, and situation, CNII-XII grossly intact, no focal deficits  Psych: Normal mood and affect   ASSESSMENT:   CATO LIBURD is a 66 y.o. male who presents for the following: 1. Typical atrial flutter (HCC)   2. Paroxysmal atrial fibrillation ([place])   3. Presence of Watchman left atrial appendage closure device   4. SOB (shortness of breath) on exertion     PLAN:   1. Typical atrial flutter (HCC) 2. Paroxysmal atrial fibrillation ([place]) 3.  Presence of Watchman left atrial appendage closure device -Status post atrial fibrillation and flutter ablation.  Status post watchman.  Remains on aspirin and Plavix.  She will complete this next month.  Off Eliquis.  Doing well.  No further A-fib or flutter episodes seen on loop recorder.  Echo normal.  Doing well.  4. SOB (shortness of breath) on exertion -Likely related to deconditioning.  Echo normal.  Disposition: Return in about 1 year (around 10/23/2022).  Medication Adjustments/Labs and Tests Ordered: Current medicines are reviewed at length with the patient today.  Concerns regarding medicines are outlined above.  No orders of the defined types were placed in this encounter.  No orders of the defined types were placed in this encounter.   Patient Instructions  Medication Instructions:  The current medical regimen is effective;  continue present plan and medications.  *If you need a refill on your cardiac medications before your next appointment, please call your pharmacy*   Follow-Up: At [facility], you and your health needs are our priority.  As part of our continuing mission to provide you with exceptional heart care, we have created designated Provider Care Teams.  These Care Teams include your primary Cardiologist (physician) and Advanced Practice Providers (APPs -  Physician Assistants and Nurse Practitioners) who all work together to provide you with the care you need, when you need it.  We recommend signing up for the patient portal called "MyChart".  Sign up information is provided on this After Visit Summary.  MyChart is used to connect with patients for Virtual Visits (Telemedicine).  Patients are able to view lab/test results, encounter notes, upcoming appointments, etc.  Non-urgent messages can be sent to your provider as well.   To learn more about what you can do with MyChart, go to [email or web address].    Your next appointment:   12 month(s)  The format  for your next appointment:   In Person  Provider:   Evalina Field, MD            Time Spent with Patient: I have spent a total of 25 minutes with patient reviewing hospital notes, telemetry, EKGs, labs and examining the patient as well as establishing an assessment and plan that was discussed  with the patient.  > 50% of time was spent in direct patient care.  Signed, Addison Naegeli. Audie Box, MD, [place]  [address] [place], [place] [address] [phone]  10/22/2021 8:54 AM

## 2021-10-22 ENCOUNTER — Ambulatory Visit: Payer: Medicare Other | Admitting: Cardiovascular Disease

## 2021-10-22 ENCOUNTER — Encounter: Payer: Self-pay | Admitting: Cardiovascular Disease

## 2021-10-22 VITALS — BP 116/78 | HR 73 | Ht 73.0 in | Wt 179.0 lb

## 2021-10-22 DIAGNOSIS — Z95818 Presence of other cardiac implants and grafts: Secondary | ICD-10-CM | POA: Diagnosis not present

## 2021-10-22 DIAGNOSIS — I483 Typical atrial flutter: Secondary | ICD-10-CM

## 2021-10-22 DIAGNOSIS — I480 Paroxysmal atrial fibrillation: Secondary | ICD-10-CM | POA: Diagnosis not present

## 2021-10-22 DIAGNOSIS — R0602 Shortness of breath: Secondary | ICD-10-CM

## 2021-10-22 NOTE — Patient Instructions (Signed)
Medication Instructions:  °The current medical regimen is effective;  continue present plan and medications. ° °*If you need a refill on your cardiac medications before your next appointment, please call your pharmacy* ° ° °Follow-Up: °At [facility], you and your health needs are our priority.  As part of our continuing mission to provide you with exceptional heart care, we have created designated Provider Care Teams.  These Care Teams include your primary Cardiologist (physician) and Advanced Practice Providers (APPs -  Physician Assistants and Nurse Practitioners) who all work together to provide you with the care you need, when you need it. ° °We recommend signing up for the patient portal called "MyChart".  Sign up information is provided on this After Visit Summary.  MyChart is used to connect with patients for Virtual Visits (Telemedicine).  Patients are able to view lab/test results, encounter notes, upcoming appointments, etc.  Non-urgent messages can be sent to your provider as well.   °To learn more about what you can do with MyChart, go to [email or web address].   ° °Your next appointment:   °12 month(s) ° °The format for your next appointment:   °In Person ° °Provider:   °Adell T O'Neal, MD   ° ° ° °

## 2021-10-26 DIAGNOSIS — M62838 Other muscle spasm: Secondary | ICD-10-CM | POA: Diagnosis not present

## 2021-10-26 DIAGNOSIS — M6281 Muscle weakness (generalized): Secondary | ICD-10-CM | POA: Diagnosis not present

## 2021-10-28 DIAGNOSIS — B309 Viral conjunctivitis, unspecified: Secondary | ICD-10-CM | POA: Diagnosis not present

## 2021-10-28 DIAGNOSIS — H00022 Hordeolum internum right lower eyelid: Secondary | ICD-10-CM | POA: Diagnosis not present

## 2021-10-28 DIAGNOSIS — H00024 Hordeolum internum left upper eyelid: Secondary | ICD-10-CM | POA: Diagnosis not present

## 2021-10-28 DIAGNOSIS — H10023 Other mucopurulent conjunctivitis, bilateral: Secondary | ICD-10-CM | POA: Diagnosis not present

## 2021-11-04 NOTE — Progress Notes (Signed)
Carelink Summary Report / Loop Recorder 

## 2021-11-08 ENCOUNTER — Ambulatory Visit (INDEPENDENT_AMBULATORY_CARE_PROVIDER_SITE_OTHER): Payer: Medicare Other

## 2021-11-08 DIAGNOSIS — I480 Paroxysmal atrial fibrillation: Secondary | ICD-10-CM | POA: Diagnosis not present

## 2021-11-10 LAB — CUP PACEART REMOTE DEVICE CHECK
Date Time Interrogation Session: 20230913091352
Implantable Pulse Generator Implant Date: 20210923

## 2021-11-11 DIAGNOSIS — H00022 Hordeolum internum right lower eyelid: Secondary | ICD-10-CM | POA: Diagnosis not present

## 2021-11-11 DIAGNOSIS — H10023 Other mucopurulent conjunctivitis, bilateral: Secondary | ICD-10-CM | POA: Diagnosis not present

## 2021-11-11 DIAGNOSIS — H00024 Hordeolum internum left upper eyelid: Secondary | ICD-10-CM | POA: Diagnosis not present

## 2021-11-11 DIAGNOSIS — B309 Viral conjunctivitis, unspecified: Secondary | ICD-10-CM | POA: Diagnosis not present

## 2021-11-15 DIAGNOSIS — H10023 Other mucopurulent conjunctivitis, bilateral: Secondary | ICD-10-CM | POA: Diagnosis not present

## 2021-11-15 DIAGNOSIS — H00024 Hordeolum internum left upper eyelid: Secondary | ICD-10-CM | POA: Diagnosis not present

## 2021-11-15 DIAGNOSIS — H00022 Hordeolum internum right lower eyelid: Secondary | ICD-10-CM | POA: Diagnosis not present

## 2021-11-15 DIAGNOSIS — H10503 Unspecified blepharoconjunctivitis, bilateral: Secondary | ICD-10-CM | POA: Diagnosis not present

## 2021-11-15 DIAGNOSIS — B309 Viral conjunctivitis, unspecified: Secondary | ICD-10-CM | POA: Diagnosis not present

## 2021-11-25 NOTE — Progress Notes (Signed)
Carelink Summary Report / Loop Recorder 

## 2021-11-29 DIAGNOSIS — B309 Viral conjunctivitis, unspecified: Secondary | ICD-10-CM | POA: Diagnosis not present

## 2021-11-29 DIAGNOSIS — H02886 Meibomian gland dysfunction of left eye, unspecified eyelid: Secondary | ICD-10-CM | POA: Diagnosis not present

## 2021-11-29 DIAGNOSIS — H02883 Meibomian gland dysfunction of right eye, unspecified eyelid: Secondary | ICD-10-CM | POA: Diagnosis not present

## 2021-11-29 DIAGNOSIS — H00022 Hordeolum internum right lower eyelid: Secondary | ICD-10-CM | POA: Diagnosis not present

## 2021-11-29 DIAGNOSIS — H10503 Unspecified blepharoconjunctivitis, bilateral: Secondary | ICD-10-CM | POA: Diagnosis not present

## 2021-11-30 DIAGNOSIS — M62838 Other muscle spasm: Secondary | ICD-10-CM | POA: Diagnosis not present

## 2021-11-30 DIAGNOSIS — M6281 Muscle weakness (generalized): Secondary | ICD-10-CM | POA: Diagnosis not present

## 2021-11-30 NOTE — Progress Notes (Unsigned)
[facility]                                     Cardiology Office Note:    Date:  12/01/2021   ID:  Lonnie Palmer, DOB 1955/09/17, MRN [id number]  PCP:  Donald Prose, [facility] Cardiologist:  Evalina Field, MD  [place] Electrophysiologist:  Vickie Epley, MD   Referring MD: Donald Prose, MD   Chief Complaint  Patient presents with   Follow-up    6 month s/p LAAO   History of Present Illness:    Lonnie Palmer is a 66 y.o. male with a hx of Afib/AFL, CAD, HTN, and HLD who was referred to Dr. Quentin Ore for possible watchman placement after discussing avoidance of long term anticoagulation. He underwent AF ablation 01/03/2020 without recurrent arrhythmia. He was felt to be a good candidate for Watchman. He underwent Watchman implant Watchman FLX 71m on 05/27/21. H e was restarted on Eliquis '5mg'$  PO BID and continued on Aspirin '81mg'$  PO daily.He was eventually taken off Eliquis and transitioned to Plavix '75mg'$  for a 6 mlnth total duration post implant therapy.   He is here alone today and states that he has been really well with no CV symptoms of chest pain, palpitations, SOB, LE edema, orthopnea, dizziness, or syncope. He no longer requires Plavix and can stop this along with dental SBE. He was previously on ASA '81mg'$ . He will stay on this however if primary cardiology prefers that he stop, that is fine with watchman team.   Past Medical History:  Diagnosis Date   Atrial fibrillation ([place]    Cancer ([facility]    prostate   Coronary artery disease    Hyperlipidemia    Hypertension    Presence of Watchman left atrial appendage closure device 05/27/2021   s/p Watchman FLX 237mwith Dr. LaQuentin Ore  Past Surgical History:  Procedure Laterality Date   APPENDECTOMY     ATRIAL FIBRILLATION ABLATION N/A 01/03/2020   Procedure: ATRIAL FIBRILLATION ABLATION;  Surgeon: LaVickie EpleyMD;  [place];  Service: Cardiovascular;   Laterality: N/A;   HERNIA REPAIR     LEFT ATRIAL APPENDAGE OCCLUSION N/A 05/27/2021   Procedure: LEFT ATRIAL APPENDAGE OCCLUSION;  Surgeon: LaVickie EpleyMD;  [facility];  Service: Cardiovascular;  Laterality: N/A;   LOOP RECORDER INSERTION N/A 11/21/2019   Procedure: LOOP RECORDER INSERTION;  Surgeon: LaVickie EpleyMD;  [facility];  Service: Cardiovascular;  Laterality: N/A;   LUMBAR DISC SURGERY     LYMPHADENECTOMY Bilateral 09/10/2020   Procedure: LYMPHADENECTOMY, PELVIC;  Surgeon: BoRaynelle BringMD;  [facility];  Service: Urology;  Laterality: Bilateral;   ROBOT ASSISTED LAPAROSCOPIC RADICAL PROSTATECTOMY N/A 09/10/2020   Procedure: XI ROBOTIC ASSISTED LAPAROSCOPIC RADICAL PROSTATECTOMY LEVEL 2;  Surgeon: BoRaynelle BringMD;  [facility];  Service: Urology;  Laterality: N/A;   TEE WITHOUT CARDIOVERSION N/A 05/27/2021   Procedure: TRANSESOPHAGEAL ECHOCARDIOGRAM (TEE);  Surgeon: LaVickie EpleyMD;  [facility];  Service: Cardiovascular;  Laterality: N/A;   VASECTOMY     WISDOM TOOTH EXTRACTION      Current Medications: Current Meds  Medication Sig   aspirin EC 81 MG tablet Take 1 tablet (81 mg total) by mouth daily. Swallow whole.   Coenzyme Q10 50 MG CAPS  Take 100 mg by mouth at bedtime.   cyclobenzaprine (FLEXERIL) 10 MG tablet Take 10 mg by mouth at bedtime.    docusate sodium (COLACE) 100 MG capsule Take 1 capsule (100 mg total) by mouth 2 (two) times daily.   doxycycline (VIBRAMYCIN) 100 MG capsule Take 100 mg by mouth daily.   ezetimibe-simvastatin (VYTORIN) 10-20 MG tablet Take 1 tablet by mouth at bedtime.   HYDROcodone-acetaminophen (NORCO/VICODIN) 5-325 MG tablet Take 1-2 tablets by mouth every 6 (six) hours as needed for moderate pain.   lisinopril-hydrochlorothiazide (ZESTORETIC) 10-12.5 MG tablet Take 1 tablet by mouth daily.   metoprolol succinate (TOPROL-XL) 50 MG 24 hr tablet Take 1 tablet in  the morning and 1/2 tablet in the evening Take with or immediately following a meal.   Multiple Vitamins-Minerals (CENTRUM SILVER ADULT 50+) TABS Take 1 tablet by mouth daily.   sildenafil (VIAGRA) 100 MG tablet Take 100 mg by mouth daily as needed for erectile dysfunction.   UNABLE TO FIND Med Name: Trimix Injections for erectile dysfunction as needed.   [DISCONTINUED] amoxicillin (AMOXIL) 500 MG capsule Take 4 capsules (2,000 mg total) by mouth as directed. Take 4 tablets 1 hour prior to dental work, including cleanings.   [DISCONTINUED] clopidogrel (PLAVIX) 75 MG tablet Take 1 tablet (75 mg total) by mouth daily.     Allergies:   Escitalopram   Social History   Socioeconomic History   Marital status: Married    Spouse name: Not on file   Number of children: 1   Years of education: Not on file   Highest education level: Not on file  Occupational History   Occupation: Contractor  Tobacco Use   Smoking status: Former    Packs/day: 0.50    Years: 6.00    Total pack years: 3.00    Types: Cigarettes    Quit date: 1987    Years since quitting: 36.7   Smokeless tobacco: Never  Vaping Use   Vaping Use: Never used  Substance and Sexual Activity   Alcohol use: Never   Drug use: Never   Sexual activity: Not on file  Other Topics Concern   Not on file  Social History Narrative   Not on file   Social Determinants of Health   Financial Resource Strain: Not on file  Food Insecurity: Not on file  Transportation Needs: Not on file  Physical Activity: Not on file  Stress: Not on file  Social Connections: Not on file     Family History: The patient's family history includes Heart attack in his maternal grandfather.  ROS:   Please see the history of present illness.    All other systems reviewed and are negative.  EKGs/Labs/Other Studies Reviewed:    The following studies were reviewed today:  Post LAAO CT 07/09/21:  IMPRESSION: 1. 27 mm Watchman FLX is  present in the LAA that is not fully endothelialized.   2. There is a small channel of the LAA superiorly (2 mm) that is not covered proximally, but closes the appendage distally. This does not communicate with the distal aspect of the appendage and does not appear to represent a leak.  Watchman implant 05/27/21:   Procedures This Admission:    CONCLUSIONS:  1.Successful implantation of a WATCHMAN left atrial appendage occlusive device    2. TEE demonstrating no LAA thrombus 3. No early apparent complications.    Post Implant Anticoagulation Strategy: Continue Eliquis '5mg'$  PO BID and Aspirin '81mg'$  PO daily. If  45-day follow up TEE meets criteria, plan to stop Eliquis and start Plavix '75mg'$  PO daily to complete 6 months of post implant therapy.   EKG:  EKG is not ordered today.    Recent Labs: 05/24/2021: Hemoglobin 15.6; Platelets 223 06/23/2021: BUN 18; Creatinine, Ser 1.16; Potassium 4.7; Sodium 143  Recent Lipid Panel No results found for: "CHOL", "TRIG", "HDL", "CHOLHDL", "VLDL", "LDLCALC", "LDLDIRECT"  Physical Exam:    VS:  BP 136/70   Pulse 76   Ht '6\' 1"'$  (1.854 m)   Wt 182 lb (82.6 kg)   BMI 24.01 kg/m     Wt Readings from Last 3 Encounters:  12/01/21 182 lb (82.6 kg)  10/22/21 179 lb (81.2 kg)  07/21/21 180 lb 12.8 oz (82 kg)    General: Well developed, well nourished, NAD Neck: Negative for carotid bruits. No JVD Lungs:Clear to ausculation bilaterally Extremities: No edema.  Neuro: Alert and oriented. No focal deficits. No facial asymmetry. MAE spontaneously. Psych: Responds to questions appropriately with normal affect.    ASSESSMENT/PLAN:    Paroxysmal atrial fibrillation: s/p ablation with Dr. Quentin Ore. He underwent Watchman implant with 48m device 05/27/21. He has since been transitioned to Plavix/ASA. He can stop Plavix along with dental SBE. He was previously taking ASA '81mg'$  and can therefore continue this if he prefers however it is not required from an  [place]. [place].    CAD: Noted to have non-obstructive calcifications on CT. Continue statin. No need for long term ASA per Dr. OAudie Boxhowever patient prefers at this time.    Medication Adjustments/Labs and Tests Ordered: Current medicines are reviewed at length with the patient today.  Concerns regarding medicines are outlined above.  No orders of the defined types were placed in this encounter.  No orders of the defined types were placed in this encounter.   Patient Instructions  Medication Instructions:  Your physician has recommended you make the following change in your medication:  STOP PLAVIX STOP  AMOXICILLIN  *If you need a refill on your cardiac medications before your next appointment, please call your pharmacy*   Lab Work: NONE If you have labs (blood work) drawn today and your tests are completely normal, you will receive your results only by: [place](if you have MyChart) OR A paper copy in the mail If you have any lab test that is abnormal or we need to change your treatment, we will call you to review the results.   Testing/Procedures: NONE   Follow-Up: [facility] and your [facility] needs are our [facility].  As part of our continuing mission to provide you with exceptional heart care, we have created designated Provider Care Teams.  These Care Teams include your primary Cardiologist (physician) and Advanced Practice Providers (APPs -  Physician Assistants and Nurse Practitioners) who all work together to provide you with the care you need, when you need it.  We recommend signing up for the patient portal called "MyChart".  Sign up information is provided on this After Visit Summary.  MyChart is used to connect with patients for Virtual Visits (Telemedicine).  Patients are able to view lab/test results, encounter notes, upcoming appointments, etc.  Non-urgent messages can be sent to your provider as  well.   To learn more about what you can do with MyChart, go to [email or web address]    Your next appointment:   KEEP SCHEDULED FOLLOW-UP  Important Information About Sugar  Signed, Kathyrn Drown, NP  12/01/2021 8:46 AM    [facility]

## 2021-12-01 ENCOUNTER — Ambulatory Visit: Payer: Medicare Other | Attending: Cardiology | Admitting: Cardiology

## 2021-12-01 VITALS — BP 136/70 | HR 76 | Ht 73.0 in | Wt 182.0 lb

## 2021-12-01 DIAGNOSIS — Z95818 Presence of other cardiac implants and grafts: Secondary | ICD-10-CM

## 2021-12-01 DIAGNOSIS — I10 Essential (primary) hypertension: Secondary | ICD-10-CM | POA: Diagnosis not present

## 2021-12-01 DIAGNOSIS — I483 Typical atrial flutter: Secondary | ICD-10-CM

## 2021-12-01 DIAGNOSIS — I480 Paroxysmal atrial fibrillation: Secondary | ICD-10-CM | POA: Diagnosis not present

## 2021-12-01 NOTE — Patient Instructions (Signed)
Medication Instructions:  Your physician has recommended you make the following change in your medication:  STOP PLAVIX STOP  AMOXICILLIN  *If you need a refill on your cardiac medications before your next appointment, please call your pharmacy*   Lab Work: NONE If you have labs (blood work) drawn today and your tests are completely normal, you will receive your results only by: [place] (if you have MyChart) OR A paper copy in the mail If you have any lab test that is abnormal or we need to change your treatment, we will call you to review the results.   Testing/Procedures: NONE   Follow-Up: At [facility], you and your health needs are our priority.  As part of our continuing mission to provide you with exceptional heart care, we have created designated Provider Care Teams.  These Care Teams include your primary Cardiologist (physician) and Advanced Practice Providers (APPs -  Physician Assistants and Nurse Practitioners) who all work together to provide you with the care you need, when you need it.  We recommend signing up for the patient portal called "MyChart".  Sign up information is provided on this After Visit Summary.  MyChart is used to connect with patients for Virtual Visits (Telemedicine).  Patients are able to view lab/test results, encounter notes, upcoming appointments, etc.  Non-urgent messages can be sent to your provider as well.   To learn more about what you can do with MyChart, go to [email or web address].    Your next appointment:   KEEP SCHEDULED FOLLOW-UP  Important Information About Sugar

## 2021-12-07 ENCOUNTER — Encounter: Payer: Self-pay | Admitting: Cardiovascular Disease

## 2021-12-09 LAB — CUP PACEART REMOTE DEVICE CHECK
Date Time Interrogation Session: 20231009231337
Implantable Pulse Generator Implant Date: 20210923

## 2021-12-13 ENCOUNTER — Ambulatory Visit (INDEPENDENT_AMBULATORY_CARE_PROVIDER_SITE_OTHER): Payer: Medicare Other

## 2021-12-13 DIAGNOSIS — I480 Paroxysmal atrial fibrillation: Secondary | ICD-10-CM | POA: Diagnosis not present

## 2021-12-28 DIAGNOSIS — M62838 Other muscle spasm: Secondary | ICD-10-CM | POA: Diagnosis not present

## 2021-12-28 DIAGNOSIS — M6281 Muscle weakness (generalized): Secondary | ICD-10-CM | POA: Diagnosis not present

## 2022-01-04 ENCOUNTER — Other Ambulatory Visit: Payer: Self-pay | Admitting: Cardiology

## 2022-01-04 DIAGNOSIS — G894 Chronic pain syndrome: Secondary | ICD-10-CM | POA: Diagnosis not present

## 2022-01-04 DIAGNOSIS — M5137 Other intervertebral disc degeneration, lumbosacral region: Secondary | ICD-10-CM | POA: Diagnosis not present

## 2022-01-04 DIAGNOSIS — I10 Essential (primary) hypertension: Secondary | ICD-10-CM | POA: Diagnosis not present

## 2022-01-04 DIAGNOSIS — E785 Hyperlipidemia, unspecified: Secondary | ICD-10-CM | POA: Diagnosis not present

## 2022-01-05 NOTE — Progress Notes (Signed)
Carelink Summary Report / Loop Recorder 

## 2022-01-17 ENCOUNTER — Ambulatory Visit (INDEPENDENT_AMBULATORY_CARE_PROVIDER_SITE_OTHER): Payer: Medicare Other

## 2022-01-17 DIAGNOSIS — I480 Paroxysmal atrial fibrillation: Secondary | ICD-10-CM

## 2022-01-18 LAB — CUP PACEART REMOTE DEVICE CHECK
Date Time Interrogation Session: 20231119231418
Implantable Pulse Generator Implant Date: 20210923

## 2022-02-14 DIAGNOSIS — M6281 Muscle weakness (generalized): Secondary | ICD-10-CM | POA: Diagnosis not present

## 2022-02-14 DIAGNOSIS — M62838 Other muscle spasm: Secondary | ICD-10-CM | POA: Diagnosis not present

## 2022-02-22 ENCOUNTER — Ambulatory Visit (INDEPENDENT_AMBULATORY_CARE_PROVIDER_SITE_OTHER): Payer: Medicare Other

## 2022-02-22 DIAGNOSIS — I480 Paroxysmal atrial fibrillation: Secondary | ICD-10-CM | POA: Diagnosis not present

## 2022-02-22 LAB — CUP PACEART REMOTE DEVICE CHECK
Date Time Interrogation Session: 20231225231702
Implantable Pulse Generator Implant Date: 20210923

## 2022-02-25 NOTE — Progress Notes (Signed)
Carelink Summary Report / Loop Recorder 

## 2022-03-09 ENCOUNTER — Encounter: Payer: Self-pay | Admitting: Cardiovascular Disease

## 2022-03-18 NOTE — Progress Notes (Signed)
Carelink Summary Report / Loop Recorder

## 2022-03-28 ENCOUNTER — Ambulatory Visit: Payer: Medicare Other

## 2022-03-28 DIAGNOSIS — I483 Typical atrial flutter: Secondary | ICD-10-CM

## 2022-03-30 LAB — CUP PACEART REMOTE DEVICE CHECK
Date Time Interrogation Session: 20240127231001
Implantable Pulse Generator Implant Date: 20210923

## 2022-05-02 ENCOUNTER — Ambulatory Visit (INDEPENDENT_AMBULATORY_CARE_PROVIDER_SITE_OTHER): Payer: Medicare Other

## 2022-05-02 DIAGNOSIS — I480 Paroxysmal atrial fibrillation: Secondary | ICD-10-CM

## 2022-05-03 LAB — CUP PACEART REMOTE DEVICE CHECK
Date Time Interrogation Session: 20240303232100
Implantable Pulse Generator Implant Date: 20210923

## 2022-05-12 NOTE — Progress Notes (Signed)
Carelink Summary Report / Loop Recorder 

## 2022-05-26 ENCOUNTER — Telehealth: Payer: Self-pay

## 2022-05-26 NOTE — Telephone Encounter (Signed)
Called to check in with patient, who had [place] on 05/27/2021. The patient reports doing well with no issues.  Scheduled the patient for 1 year visit with Dr. Audie Box 10/04/2022. He was grateful for call and agreed with plan.

## 2022-06-01 ENCOUNTER — Ambulatory Visit: Payer: Medicare Other

## 2022-06-06 ENCOUNTER — Ambulatory Visit (INDEPENDENT_AMBULATORY_CARE_PROVIDER_SITE_OTHER): Payer: Medicare Other

## 2022-06-06 DIAGNOSIS — I480 Paroxysmal atrial fibrillation: Secondary | ICD-10-CM | POA: Diagnosis not present

## 2022-06-06 LAB — CUP PACEART REMOTE DEVICE CHECK
Date Time Interrogation Session: 20240405231035
Implantable Pulse Generator Implant Date: 20210923

## 2022-06-10 NOTE — Progress Notes (Signed)
Carelink Summary Report / Loop Recorder 

## 2022-06-16 ENCOUNTER — Other Ambulatory Visit: Payer: Self-pay | Admitting: Cardiovascular Disease

## 2022-06-29 HISTORY — PX: COLONOSCOPY: SHX174

## 2022-07-07 LAB — CUP PACEART REMOTE DEVICE CHECK
Date Time Interrogation Session: 20240508231310
Implantable Pulse Generator Implant Date: 20210923

## 2022-07-09 IMAGING — CT CT HEART MORPH/PULM VEIN W/ CM & W/O CA SCORE
1 series · 7 of 12 positions shown, 9 images · non-contrast
Comparison: None Available.
COMPARISON: None Available.

Addendum:
EXAM:
OVER-READ INTERPRETATION  CT CHEST

The following report is a limited chest CT over-read performed by
07/09/2021. This over-read does not include interpretation of cardiac
or coronary anatomy or pathology This over-read does not include
interpretation of cardiac or coronary anatomy or pathology. The
cardiac CTA interpretation by the cardiologist is attached.
CLINICAL DATA: Post-Watchman Evaluation.
Cardiac CT/CTA
TECHNIQUE: A non-contrast, gated CT scan was obtained with axial slices of 3 mm
through the heart for calcium scoring. Calcium scoring was performed
using the Agatston method. A 110 kV prospective, gated, contrast
cardiac scan was obtained. Gantry rotation speed was 250 msecs and
collimation was 0.6 mm. Nitroglycerin was not given. A delayed scan
was obtained to exclude left atrial appendage thrombus. The 3D
dataset was reconstructed in 5% intervals of the 25-50% of the R-R
cycle. Late systolic phases were analyzed on a dedicated workstation
using MPR, MIP, and VRT modes. The patient received 80 cc of
contrast.

[Series 281: findings · 0.30mm/px · 7 of 12 slices shown, 9 images]
[im 3/12  vessel]
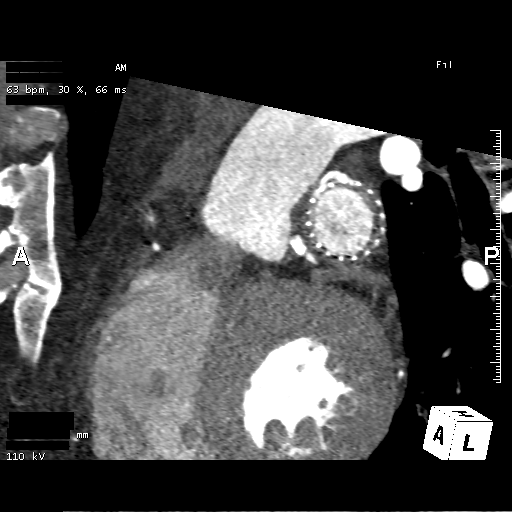
[im 3/12  lung]
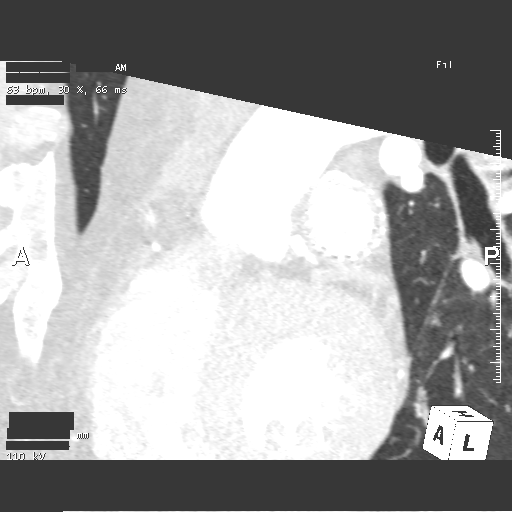
[im 4/12  vessel]
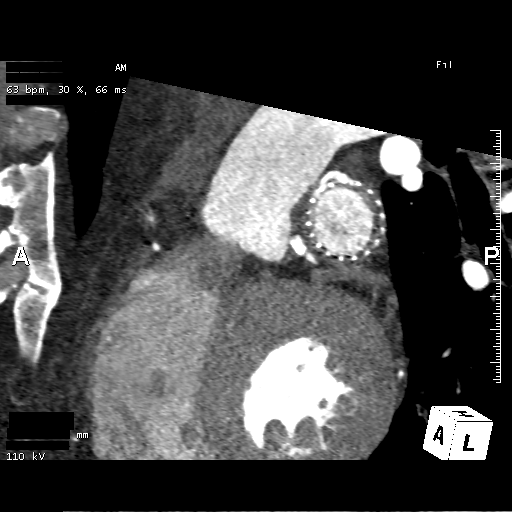
[im 5/12  vessel]
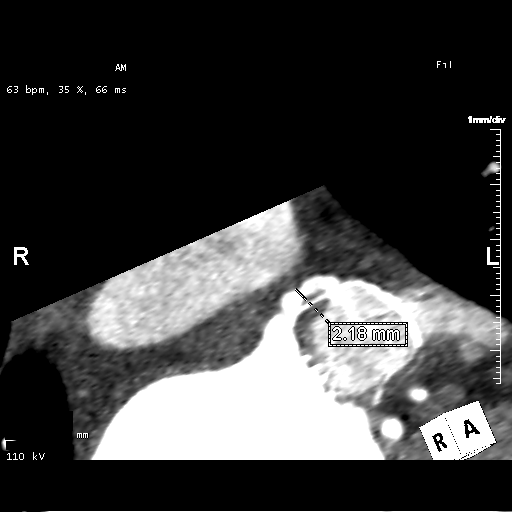
[im 6/12  vessel]
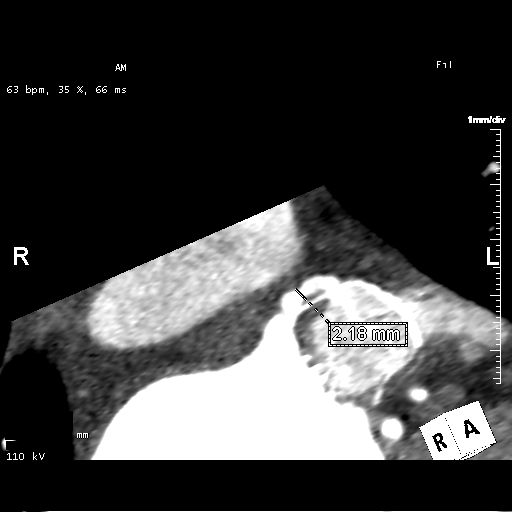
[im 9/12  vessel]
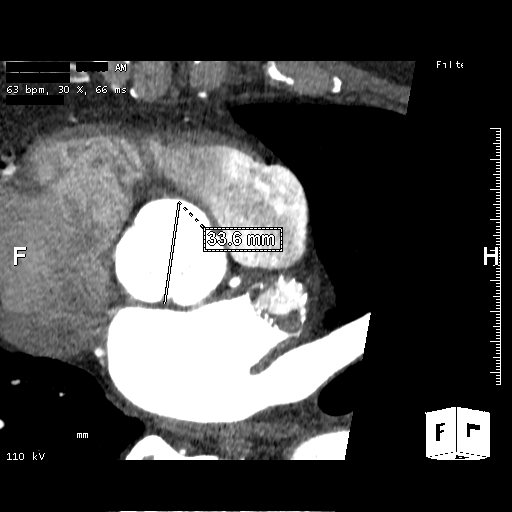
[im 9/12  lung]
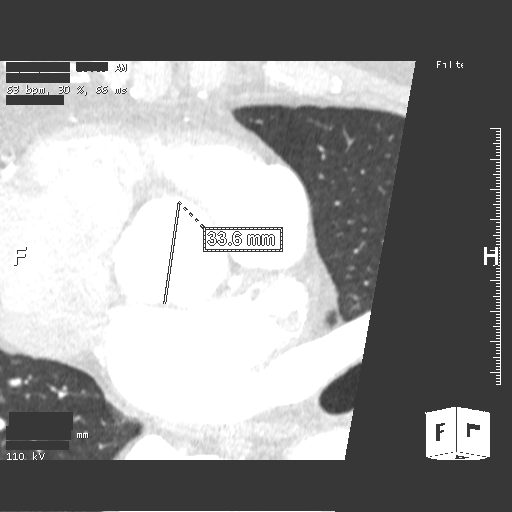
[im 10/12  vessel]
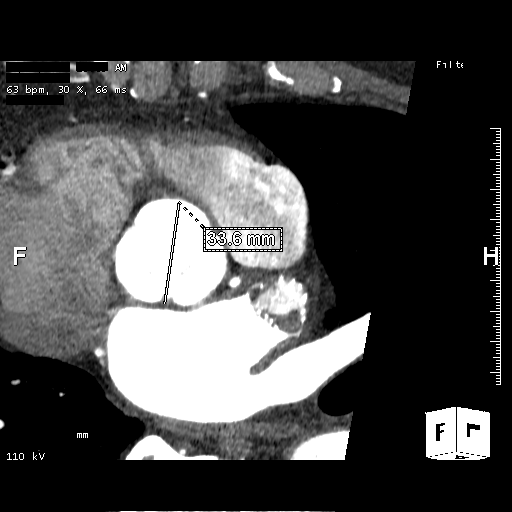
[im 11/12  vessel]
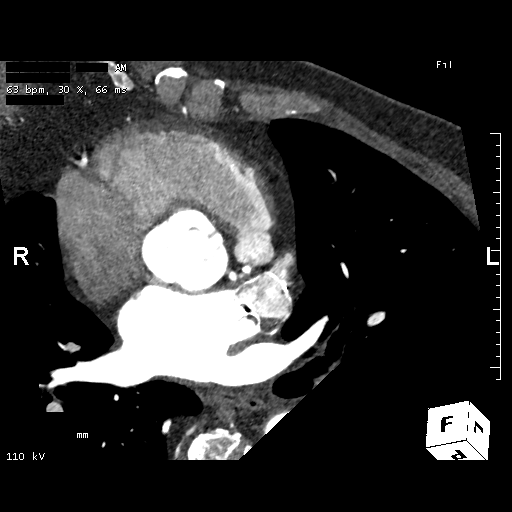

[7 of 12 positions shown; findings below may reference images not displayed]

FINDINGS: Vascular: Normal heart size. No pericardial effusion. Left atrial
closure device noted. Normal caliber thoracic aorta with no
significant atherosclerotic disease.

Mediastinum/Nodes: Esophagus is unremarkable. No pathologically
enlarged lymph nodes seen in the chest.

Lungs/Pleura: Central airways are patent. No consolidation, pleural
effusion or pneumothorax.

Upper Abdomen: No acute abnormality.

Musculoskeletal: No chest wall mass or suspicious bone lesions
identified.
IMPRESSION: No significant extracardiac abnormality.
FINDINGS: Image quality: excellent.

Noise artifact is: Limited.

Left Atrial Appendage: A 27 mm Nebuli Manasashvili device is present in the
left atrial appendage. The device is well seated. There is no device
related thrombus. The device covers the majority of the appendage.
There is a small channel of the AMANADZE that is not covered proximally
(superior location), but closes the appendage distally. This
measures 2 mm. The appendage is closed but the device not fully
endothelialized. No device leak.

Left Atrium: The left atrial size is normal. A small PFO is present.
There is normal pulmonary vein drainage into the left atrium (2 on
the right and 2 on the left).

Coronary Arteries: Normal coronary origin. Right dominance. The
study was performed without use of NTG and is insufficient for
plaque evaluation. The following assessment was made of the proximal
segments:

Right Atrium: Right atrial size is within normal limits.

Right Ventricle: The right ventricular cavity is within normal
limits.

Left Ventricle: The ventricular cavity size is within normal limits.
There are no stigmata of prior infarction. There is no abnormal
filling defect.

Pulmonary Artery: Normal caliber without proximal filling defect.

Cardiac valves: The aortic valve is trileaflet without significant
calcification. The mitral valve is normal structure without
significant calcification.

Aorta: Normal caliber with no significant disease.

Pericardium: Normal thickness with no significant effusion or
calcium present.

Extra-cardiac findings: See attached radiology report for
non-cardiac structures.
IMPRESSION: 1. 27 mm Nebuli Manasashvili is present in the AMANADZE that is not fully
endothelialized.

2. There is a small channel of the AMANADZE superiorly (2 mm) that is not
covered proximally, but closes the appendage distally. This does not
communicate with the distal aspect of the appendage and does not
appear to represent a leak.

*** End of Addendum ***
EXAM:
OVER-READ INTERPRETATION  CT CHEST

The following report is a limited chest CT over-read performed by
07/09/2021. This over-read does not include interpretation of cardiac
or coronary anatomy or pathology This over-read does not include
interpretation of cardiac or coronary anatomy or pathology. The
cardiac CTA interpretation by the cardiologist is attached.
FINDINGS: Vascular: Normal heart size. No pericardial effusion. Left atrial
closure device noted. Normal caliber thoracic aorta with no
significant atherosclerotic disease.

Mediastinum/Nodes: Esophagus is unremarkable. No pathologically
enlarged lymph nodes seen in the chest.

Lungs/Pleura: Central airways are patent. No consolidation, pleural
effusion or pneumothorax.

Upper Abdomen: No acute abnormality.

Musculoskeletal: No chest wall mass or suspicious bone lesions
identified.
IMPRESSION: No significant extracardiac abnormality.

## 2022-07-11 ENCOUNTER — Ambulatory Visit (INDEPENDENT_AMBULATORY_CARE_PROVIDER_SITE_OTHER): Payer: Medicare Other

## 2022-07-11 DIAGNOSIS — I480 Paroxysmal atrial fibrillation: Secondary | ICD-10-CM | POA: Diagnosis not present

## 2022-07-13 NOTE — Progress Notes (Signed)
Carelink Summary Report / Loop Recorder 

## 2022-07-22 DIAGNOSIS — I480 Paroxysmal atrial fibrillation: Secondary | ICD-10-CM | POA: Diagnosis not present

## 2022-07-22 DIAGNOSIS — M5137 Other intervertebral disc degeneration, lumbosacral region: Secondary | ICD-10-CM | POA: Diagnosis not present

## 2022-07-22 DIAGNOSIS — M549 Dorsalgia, unspecified: Secondary | ICD-10-CM | POA: Diagnosis not present

## 2022-07-22 DIAGNOSIS — E785 Hyperlipidemia, unspecified: Secondary | ICD-10-CM | POA: Diagnosis not present

## 2022-07-22 DIAGNOSIS — I10 Essential (primary) hypertension: Secondary | ICD-10-CM | POA: Diagnosis not present

## 2022-07-22 DIAGNOSIS — Z0000 Encounter for general adult medical examination without abnormal findings: Secondary | ICD-10-CM | POA: Diagnosis not present

## 2022-07-22 DIAGNOSIS — I2510 Atherosclerotic heart disease of native coronary artery without angina pectoris: Secondary | ICD-10-CM | POA: Diagnosis not present

## 2022-08-08 NOTE — Progress Notes (Signed)
Carelink Summary Report / Loop Recorder 

## 2022-08-10 LAB — CUP PACEART REMOTE DEVICE CHECK
Date Time Interrogation Session: 20240610230755
Implantable Pulse Generator Implant Date: 20210923

## 2022-08-15 ENCOUNTER — Ambulatory Visit (INDEPENDENT_AMBULATORY_CARE_PROVIDER_SITE_OTHER): Payer: Medicare Other

## 2022-08-15 DIAGNOSIS — I480 Paroxysmal atrial fibrillation: Secondary | ICD-10-CM | POA: Diagnosis not present

## 2022-08-29 DIAGNOSIS — H02883 Meibomian gland dysfunction of right eye, unspecified eyelid: Secondary | ICD-10-CM | POA: Diagnosis not present

## 2022-08-29 DIAGNOSIS — H2513 Age-related nuclear cataract, bilateral: Secondary | ICD-10-CM | POA: Diagnosis not present

## 2022-08-29 DIAGNOSIS — H33321 Round hole, right eye: Secondary | ICD-10-CM | POA: Diagnosis not present

## 2022-08-29 DIAGNOSIS — H00022 Hordeolum internum right lower eyelid: Secondary | ICD-10-CM | POA: Diagnosis not present

## 2022-08-29 DIAGNOSIS — H10503 Unspecified blepharoconjunctivitis, bilateral: Secondary | ICD-10-CM | POA: Diagnosis not present

## 2022-08-29 DIAGNOSIS — H02886 Meibomian gland dysfunction of left eye, unspecified eyelid: Secondary | ICD-10-CM | POA: Diagnosis not present

## 2022-09-05 NOTE — Progress Notes (Signed)
Carelink Summary Report / Loop Recorder 

## 2022-09-12 ENCOUNTER — Ambulatory Visit (INDEPENDENT_AMBULATORY_CARE_PROVIDER_SITE_OTHER): Payer: Medicare Other

## 2022-09-12 DIAGNOSIS — I480 Paroxysmal atrial fibrillation: Secondary | ICD-10-CM

## 2022-09-14 LAB — CUP PACEART REMOTE DEVICE CHECK
Date Time Interrogation Session: 20240713230516
Implantable Pulse Generator Implant Date: 20210923

## 2022-09-19 ENCOUNTER — Ambulatory Visit: Payer: Medicare Other

## 2022-09-26 NOTE — Progress Notes (Signed)
Carelink Summary Report / Loop Recorder 

## 2022-10-04 ENCOUNTER — Ambulatory Visit: Payer: Medicare Other | Admitting: Cardiovascular Disease

## 2022-10-14 LAB — CUP PACEART REMOTE DEVICE CHECK
Date Time Interrogation Session: 20240816094218
Implantable Pulse Generator Implant Date: 20210923

## 2022-10-16 NOTE — Progress Notes (Unsigned)
Cardiology Office Note:   Date:  10/17/2022  NAME:  Lonnie Palmer    MRN: [id number] DOB:  [birth year]   PCP:  Deatra James, MD  Cardiologist:  Reatha Harps, MD  Electrophysiologist:  Lanier Prude, MD   Referring MD: Deatra James, MD   Chief Complaint  Patient presents with   Follow-up         History of Present Illness:   Lonnie Palmer is a 67 y.o. male with a hx of pAF, CAD, HTN who presents for follow-up.  He reports he is doing well.  Denies any chest pain or trouble breathing.  Cholesterol levels at goal.  Blood pressure well-controlled.  No recurrence of A-fib or atrial flutter.  His prostate cancer has been treated.  Doing well.  Using nonpharmacologic treatments for erectile dysfunction.  Overall doing well with this.  Problem List 1. HTN 2.CAD -CAC score 385 (82nd percentile)  -Total chol 131, HDL 53, LDL 54, TG 142 3. PACs -23.3% PAC burden 4.  Atrial flutter/fibrillation  -11/15/2019 -CHADSVASC=2 (HTN, CAD) -s/p AFL/AF ablation 01/03/2020 -27 mm Watchman FLX 05/27/2021   Past Medical History: Past Medical History:  Diagnosis Date   Atrial fibrillation (HCC)    Cancer (HCC)    prostate   Coronary artery disease    Hyperlipidemia    Hypertension    Presence of Watchman left atrial appendage closure device 05/27/2021   s/p Watchman FLX 27mm with Dr. Lalla Brothers    Past Surgical History: Past Surgical History:  Procedure Laterality Date   APPENDECTOMY     ATRIAL FIBRILLATION ABLATION N/A 01/03/2020   Procedure: ATRIAL FIBRILLATION ABLATION;  Surgeon: Lanier Prude, MD;  Location: [facility];  Service: Cardiovascular;  Laterality: N/A;   HERNIA REPAIR     LEFT ATRIAL APPENDAGE OCCLUSION N/A 05/27/2021   Procedure: LEFT ATRIAL APPENDAGE OCCLUSION;  Surgeon: Lanier Prude, MD;  Location: [facility];  Service: Cardiovascular;  Laterality: N/A;   LOOP RECORDER INSERTION N/A 11/21/2019   Procedure: LOOP RECORDER INSERTION;   Surgeon: Lanier Prude, MD;  Location: [facility];  Service: Cardiovascular;  Laterality: N/A;   LUMBAR DISC SURGERY     LYMPHADENECTOMY Bilateral 09/10/2020   Procedure: LYMPHADENECTOMY, PELVIC;  Surgeon: Heloise Purpura, MD;  Location: [facility];  Service: Urology;  Laterality: Bilateral;   ROBOT ASSISTED LAPAROSCOPIC RADICAL PROSTATECTOMY N/A 09/10/2020   Procedure: XI ROBOTIC ASSISTED LAPAROSCOPIC RADICAL PROSTATECTOMY LEVEL 2;  Surgeon: Heloise Purpura, MD;  Location: [facility];  Service: Urology;  Laterality: N/A;   TEE WITHOUT CARDIOVERSION N/A 05/27/2021   Procedure: TRANSESOPHAGEAL ECHOCARDIOGRAM (TEE);  Surgeon: Lanier Prude, MD;  Location: [facility] INVASIVE CV LAB;  Service: Cardiovascular;  Laterality: N/A;   VASECTOMY     WISDOM TOOTH EXTRACTION      Current Medications: Current Meds  Medication Sig   aspirin EC 81 MG tablet Take 1 tablet (81 mg total) by mouth daily. Swallow whole.   cyclobenzaprine (FLEXERIL) 10 MG tablet Take 10 mg by mouth at bedtime.    docusate sodium (COLACE) 100 MG capsule Take 1 capsule (100 mg total) by mouth 2 (two) times daily.   ezetimibe-simvastatin (VYTORIN) 10-20 MG tablet Take 1 tablet by mouth at bedtime.   HYDROcodone-acetaminophen (NORCO/VICODIN) 5-325 MG tablet Take 1-2 tablets by mouth every 6 (six) hours as needed for moderate pain.   lisinopril-hydrochlorothiazide (ZESTORETIC) 10-12.5 MG tablet Take 1 tablet by mouth daily.   metoprolol succinate (TOPROL-XL) 50 MG  24 hr tablet TAKE ONE TABLET BY MOUTH EVERY MORNING AND TAKE 1/2 TABLET BY MOUTH EVERY EVENING WITH OR IMMEDIATELY FOLLOWING A MEAL   Multiple Vitamins-Minerals (CENTRUM SILVER ADULT 50+) TABS Take 1 tablet by mouth daily.   sildenafil (VIAGRA) 100 MG tablet Take 100 mg by mouth daily as needed for erectile dysfunction.   UNABLE TO FIND Med Name: Trimix Injections for erectile dysfunction as needed.     Allergies:    Escitalopram   Social History: Social History    Socioeconomic History   Marital status: Married    Spouse name: Not on file   Number of children: 1   Years of education: Not on file   Highest education level: Not on file  Occupational History   Occupation: Geographical information systems officer  Tobacco Use   Smoking status: Former    Current packs/day: 0.00    Average packs/day: 0.5 packs/day for 6.0 years (3.0 ttl pk-yrs)    Types: Cigarettes    Start date: 29    Quit date: 1987    Years since quitting: 37.6   Smokeless tobacco: Never  Vaping Use   Vaping status: Never Used  Substance and Sexual Activity   Alcohol use: Never   Drug use: Never   Sexual activity: Not on file  Other Topics Concern   Not on file  Social History Narrative   Not on file   Social Determinants of Health   Financial Resource Strain: Not on file  Food Insecurity: Not on file  Transportation Needs: Not on file  Physical Activity: Not on file  Stress: Not on file  Social Connections: Not on file     Family History: The patient's family history includes Heart attack in his maternal grandfather.  ROS:   All other ROS reviewed and negative. Pertinent positives noted in the HPI.     EKGs/Labs/Other Studies Reviewed:   The following studies were personally reviewed by me today:  EKG:  EKG is not ordered today.        Recent Labs: No results found for requested labs within last 365 days.   Recent Lipid Panel No results found for: "CHOL", "TRIG", "HDL", "CHOLHDL", "VLDL", "LDLCALC", "LDLDIRECT"  Physical Exam:   VS:  BP 130/68 (BP Location: Left Arm, Patient Position: Sitting, Cuff Size: Normal)   Pulse 74   Ht 6\' 1"  (1.854 m)   Wt 184 lb 6.4 oz (83.6 kg)   SpO2 100%   BMI 24.33 kg/m    Wt Readings from Last 3 Encounters:  10/17/22 184 lb 6.4 oz (83.6 kg)  12/01/21 182 lb (82.6 kg)  10/22/21 179 lb (81.2 kg)    General: Well nourished, well developed, in no acute distress Head: Atraumatic, normal size  Eyes: PEERLA, EOMI  Neck:  Supple, no JVD Endocrine: No thryomegaly Cardiac: Normal S1, S2; RRR; no murmurs, rubs, or gallops Lungs: Clear to auscultation bilaterally, no wheezing, rhonchi or rales  Abd: Soft, nontender, no hepatomegaly  Ext: No edema, pulses 2+ Musculoskeletal: No deformities, BUE and BLE strength normal and equal Skin: Warm and dry, no rashes   Neuro: Alert and oriented to person, place, time, and situation, CNII-XII grossly intact, no focal deficits  Psych: Normal mood and affect   ASSESSMENT:   Lonnie Palmer is a 67 y.o. male who presents for the following: 1. Paroxysmal atrial fibrillation (HCC)   2. Presence of Watchman left atrial appendage closure device   3. Agatston coronary artery calcium score between 100  and 400   4. Mixed hyperlipidemia   5. Essential hypertension     PLAN:   1. Paroxysmal atrial fibrillation (HCC) 2. Presence of Watchman left atrial appendage closure device -Status post A-fib and flutter ablation.  Doing well.  Status post watchman.  On aspirin 81 mg daily.  No issues.  Continue with surveillance of rhythm with Apple Watch.  3. Agatston coronary artery calcium score between 100 and 400 4. Mixed hyperlipidemia -Elevated coronary calcium score.  No symptoms of angina.  On aspirin.  LDL cholesterol is at goal.  We continue Vytorin at current dose.  5. Essential hypertension -Well-controlled.  No change to medications.      Disposition: Return in about 1 year (around 10/17/2023).  Medication Adjustments/Labs and Tests Ordered: Current medicines are reviewed at length with the patient today.  Concerns regarding medicines are outlined above.  No orders of the defined types were placed in this encounter.  No orders of the defined types were placed in this encounter.  Patient Instructions  Medication Instructions:  Your physician recommends that you continue on your current medications as directed. Please refer to the Current Medication list given to you  today.  *If you need a refill on your cardiac medications before your next appointment, please call your pharmacy*     Follow-Up: At [facility], you and your health needs are our priority.  As part of our continuing mission to provide you with exceptional heart care, we have created designated Provider Care Teams.  These Care Teams include your primary Cardiologist (physician) and Advanced Practice Providers (APPs -  Physician Assistants and Nurse Practitioners) who all work together to provide you with the care you need, when you need it.  We recommend signing up for the patient portal called "MyChart".  Sign up information is provided on this After Visit Summary.  MyChart is used to connect with patients for Virtual Visits (Telemedicine).  Patients are able to view lab/test results, encounter notes, upcoming appointments, etc.  Non-urgent messages can be sent to your provider as well.   To learn more about what you can do with MyChart, go to [email or web address].    Your next appointment:   1 year(s)  Provider:   Reatha Harps, MD      Time Spent with Patient: I have spent a total of 25 minutes with patient reviewing hospital notes, telemetry, EKGs, labs and examining the patient as well as establishing an assessment and plan that was discussed with the patient.  > 50% of time was spent in direct patient care.  Signed, Lenna Gilford. Flora Lipps, MD, [facility]  [facility]  [address], [address] [place], [place] [address] [phone]  10/17/2022 8:27 AM

## 2022-10-17 ENCOUNTER — Ambulatory Visit (INDEPENDENT_AMBULATORY_CARE_PROVIDER_SITE_OTHER): Payer: Medicare Other

## 2022-10-17 ENCOUNTER — Ambulatory Visit: Payer: Medicare Other | Attending: Cardiovascular Disease | Admitting: Cardiovascular Disease

## 2022-10-17 ENCOUNTER — Encounter: Payer: Self-pay | Admitting: Cardiovascular Disease

## 2022-10-17 VITALS — BP 130/68 | HR 74 | Ht 73.0 in | Wt 184.4 lb

## 2022-10-17 DIAGNOSIS — I480 Paroxysmal atrial fibrillation: Secondary | ICD-10-CM

## 2022-10-17 DIAGNOSIS — R931 Abnormal findings on diagnostic imaging of heart and coronary circulation: Secondary | ICD-10-CM

## 2022-10-17 DIAGNOSIS — Z95818 Presence of other cardiac implants and grafts: Secondary | ICD-10-CM

## 2022-10-17 DIAGNOSIS — E782 Mixed hyperlipidemia: Secondary | ICD-10-CM

## 2022-10-17 DIAGNOSIS — I10 Essential (primary) hypertension: Secondary | ICD-10-CM | POA: Diagnosis not present

## 2022-10-17 NOTE — Patient Instructions (Signed)
 Medication Instructions:  Your physician recommends that you continue on your current medications as directed. Please refer to the Current Medication list given to you today.  *If you need a refill on your cardiac medications before your next appointment, please call your pharmacy*    Follow-Up: At [facility], you and your health needs are our priority.  As part of our continuing mission to provide you with exceptional heart care, we have created designated Provider Care Teams.  These Care Teams include your primary Cardiologist (physician) and Advanced Practice Providers (APPs -  Physician Assistants and Nurse Practitioners) who all work together to provide you with the care you need, when you need it.  We recommend signing up for the patient portal called "MyChart".  Sign up information is provided on this After Visit Summary.  MyChart is used to connect with patients for Virtual Visits (Telemedicine).  Patients are able to view lab/test results, encounter notes, upcoming appointments, etc.  Non-urgent messages can be sent to your provider as well.   To learn more about what you can do with MyChart, go to [email or web address].    Your next appointment:   1 year(s)  Provider:   Reatha Harps, MD

## 2022-10-21 DIAGNOSIS — Z800 Family history of malignant neoplasm of digestive organs: Secondary | ICD-10-CM | POA: Diagnosis not present

## 2022-10-21 DIAGNOSIS — Z86010 Personal history of colonic polyps: Secondary | ICD-10-CM | POA: Diagnosis not present

## 2022-10-21 DIAGNOSIS — K5730 Diverticulosis of large intestine without perforation or abscess without bleeding: Secondary | ICD-10-CM | POA: Diagnosis not present

## 2022-10-27 NOTE — Progress Notes (Signed)
Carelink Summary Report / Loop Recorder 

## 2022-11-07 DIAGNOSIS — L578 Other skin changes due to chronic exposure to nonionizing radiation: Secondary | ICD-10-CM | POA: Diagnosis not present

## 2022-11-07 DIAGNOSIS — D225 Melanocytic nevi of trunk: Secondary | ICD-10-CM | POA: Diagnosis not present

## 2022-11-07 DIAGNOSIS — L219 Seborrheic dermatitis, unspecified: Secondary | ICD-10-CM | POA: Diagnosis not present

## 2022-11-07 DIAGNOSIS — L814 Other melanin hyperpigmentation: Secondary | ICD-10-CM | POA: Diagnosis not present

## 2022-11-07 DIAGNOSIS — L821 Other seborrheic keratosis: Secondary | ICD-10-CM | POA: Diagnosis not present

## 2022-11-07 DIAGNOSIS — Z86018 Personal history of other benign neoplasm: Secondary | ICD-10-CM | POA: Diagnosis not present

## 2022-11-21 ENCOUNTER — Encounter: Payer: Self-pay | Admitting: Cardiology

## 2022-11-21 ENCOUNTER — Ambulatory Visit (INDEPENDENT_AMBULATORY_CARE_PROVIDER_SITE_OTHER): Payer: Medicare Other

## 2022-11-21 DIAGNOSIS — I480 Paroxysmal atrial fibrillation: Secondary | ICD-10-CM

## 2022-11-22 DIAGNOSIS — H33321 Round hole, right eye: Secondary | ICD-10-CM | POA: Diagnosis not present

## 2022-11-22 DIAGNOSIS — G43B0 Ophthalmoplegic migraine, not intractable: Secondary | ICD-10-CM | POA: Diagnosis not present

## 2022-11-22 DIAGNOSIS — H43813 Vitreous degeneration, bilateral: Secondary | ICD-10-CM | POA: Diagnosis not present

## 2022-11-22 DIAGNOSIS — H35373 Puckering of macula, bilateral: Secondary | ICD-10-CM | POA: Diagnosis not present

## 2022-11-22 DIAGNOSIS — H2513 Age-related nuclear cataract, bilateral: Secondary | ICD-10-CM | POA: Diagnosis not present

## 2022-12-05 NOTE — Progress Notes (Signed)
Carelink Summary Report / Loop Recorder 

## 2022-12-26 ENCOUNTER — Ambulatory Visit (INDEPENDENT_AMBULATORY_CARE_PROVIDER_SITE_OTHER): Payer: Medicare Other

## 2022-12-26 DIAGNOSIS — I480 Paroxysmal atrial fibrillation: Secondary | ICD-10-CM

## 2022-12-27 LAB — CUP PACEART REMOTE DEVICE CHECK
Date Time Interrogation Session: 20241027230156
Implantable Pulse Generator Implant Date: 20210923

## 2023-01-09 ENCOUNTER — Other Ambulatory Visit: Payer: Self-pay | Admitting: Medical Genetics

## 2023-01-09 DIAGNOSIS — Z006 Encounter for examination for normal comparison and control in clinical research program: Secondary | ICD-10-CM

## 2023-01-16 NOTE — Progress Notes (Signed)
Carelink Summary Report / Loop Recorder 

## 2023-01-20 DIAGNOSIS — E785 Hyperlipidemia, unspecified: Secondary | ICD-10-CM | POA: Diagnosis not present

## 2023-01-20 DIAGNOSIS — I10 Essential (primary) hypertension: Secondary | ICD-10-CM | POA: Diagnosis not present

## 2023-01-20 DIAGNOSIS — M5137 Other intervertebral disc degeneration, lumbosacral region with discogenic back pain only: Secondary | ICD-10-CM | POA: Diagnosis not present

## 2023-01-20 DIAGNOSIS — G894 Chronic pain syndrome: Secondary | ICD-10-CM | POA: Diagnosis not present

## 2023-01-29 LAB — CUP PACEART REMOTE DEVICE CHECK
Date Time Interrogation Session: 20241129230034
Implantable Pulse Generator Implant Date: 20210923

## 2023-01-30 ENCOUNTER — Encounter: Payer: Self-pay | Admitting: Cardiovascular Disease

## 2023-01-30 ENCOUNTER — Ambulatory Visit (INDEPENDENT_AMBULATORY_CARE_PROVIDER_SITE_OTHER): Payer: Medicare Other

## 2023-01-30 DIAGNOSIS — I480 Paroxysmal atrial fibrillation: Secondary | ICD-10-CM | POA: Diagnosis not present

## 2023-01-31 ENCOUNTER — Ambulatory Visit: Payer: Medicare Other | Attending: Cardiovascular Disease | Admitting: Cardiovascular Disease

## 2023-01-31 ENCOUNTER — Encounter: Payer: Self-pay | Admitting: Cardiovascular Disease

## 2023-01-31 VITALS — BP 122/78 | HR 75 | Ht 73.0 in | Wt 183.0 lb

## 2023-01-31 DIAGNOSIS — E782 Mixed hyperlipidemia: Secondary | ICD-10-CM | POA: Diagnosis not present

## 2023-01-31 DIAGNOSIS — I483 Typical atrial flutter: Secondary | ICD-10-CM

## 2023-01-31 DIAGNOSIS — I4891 Unspecified atrial fibrillation: Secondary | ICD-10-CM | POA: Diagnosis not present

## 2023-01-31 DIAGNOSIS — I491 Atrial premature depolarization: Secondary | ICD-10-CM | POA: Diagnosis not present

## 2023-01-31 DIAGNOSIS — R931 Abnormal findings on diagnostic imaging of heart and coronary circulation: Secondary | ICD-10-CM | POA: Diagnosis not present

## 2023-01-31 NOTE — Patient Instructions (Signed)
Medication Instructions:  Your physician recommends that you continue on your current medications as directed. Please refer to the Current Medication list given to you today.    *If you need a refill on your cardiac medications before your next appointment, please call your pharmacy*   Lab Work: Your physician recommends that you return for lab work in 1 week for  - LIPIDS -BMET   If you have labs (blood work) drawn today and your tests are completely normal, you will receive your results only by: MyChart Message (if you have MyChart) OR A paper copy in the mail If you have any lab test that is abnormal or we need to change your treatment, we will call you to review the results.   Testing/Procedures: NONE    Follow-Up: At [facility], you and your health needs are our priority.  As part of our continuing mission to provide you with exceptional heart care, we have created designated Provider Care Teams.  These Care Teams include your primary Cardiologist (physician) and Advanced Practice Providers (APPs -  Physician Assistants and Nurse Practitioners) who all work together to provide you with the care you need, when you need it.  We recommend signing up for the patient portal called "MyChart".  Sign up information is provided on this After Visit Summary.  MyChart is used to connect with patients for Virtual Visits (Telemedicine).  Patients are able to view lab/test results, encounter notes, upcoming appointments, etc.  Non-urgent messages can be sent to your provider as well.   To learn more about what you can do with MyChart, go to [email or web address].    Your next appointment:   1 year(s)  The format for your next appointment:   In Person  Provider:   Edd Fabian, FNP, Micah Flesher, PA-C, Marjie Skiff, PA-C, Robet Leu, PA-C, Juanda Crumble, PA-C, Joni Reining, DNP, ANP, Azalee Course, PA-C, Bernadene Person, NP, or Reather Littler, NP    OR Reatha Harps, MD will  plan to see you again in 1 year(s).   Other Instructions

## 2023-01-31 NOTE — Progress Notes (Signed)
Cardiology Office Note:  .   Date:  01/31/2023  ID:  Lonnie Palmer, DOB 07-05-1955, MRN [id number] PCP: Deatra James, MD  [facility] Providers Cardiologist:  Reatha Harps, MD Electrophysiologist:  Lanier Prude, MD {  History of Present Illness: Marland Kitchen   Lonnie Palmer is a 67 y.o. male with history of pAF who presents for follow-up.   History of Present Illness   Lonnie Palmer, a 67 year old male with a history of atrial fibrillation and flutter, status post ablation, and status post left atrial appendage closure with a Watchman device, presents for a follow-up visit. The patient also has nonobstructive CAD. The patient's primary concern is recent lab results showing elevated triglycerides and decreased kidney function. The patient was not fasting at the time of the blood draw, which could have affected the triglyceride levels. The patient's kidney function has decreased to eGFR 57, which is lower than previous readings that ranged from 60 to 70. The patient is concerned about these results and has been drinking more water to improve kidney function.  In addition to these concerns, the patient mentions a potential biochemical recurrence of prostate cancer. The patient's prostate-specific antigen (PSA) levels have increased, and the patient is being monitored every three months. The patient is considering potential treatment options, including radiation and hormone treatments, and is concerned about the potential side effects and impact on quality of life. The patient is also concerned about the potential impact of these health issues on life expectancy.          Problem List 1. HTN 2.CAD -CAC score 385 (82nd percentile)  -Total chol 131, HDL 53, LDL 54, TG 142 3. PACs -23.3% PAC burden 4.  Atrial flutter/fibrillation  -11/15/2019 -CHADSVASC=2 (HTN, CAD) -s/p AFL/AF ablation 01/03/2020 -27 mm Watchman FLX 05/27/2021    ROS: All other ROS reviewed and negative.  Pertinent positives noted in the HPI.     Studies Reviewed: Marland Kitchen   EKG Interpretation Date/Time:  Tuesday January 31 2023 08:05:10 EST Ventricular Rate:  75 PR Interval:  118 QRS Duration:  124 QT Interval:  382 QTC Calculation: 426 R Axis:   -9  Text Interpretation: Sinus rhythm with Premature supraventricular complexes Right bundle branch block Confirmed by Lennie Odor [phone]) on 01/31/2023 8:21:59 AM   Physical Exam:   VS:  BP 122/78 (BP Location: Left Arm, Patient Position: Sitting, Cuff Size: Normal)   Pulse 75   Ht 6\' 1"  (1.854 m)   Wt 183 lb (83 kg)   BMI 24.14 kg/m    Wt Readings from Last 3 Encounters:  01/31/23 183 lb (83 kg)  10/17/22 184 lb 6.4 oz (83.6 kg)  12/01/21 182 lb (82.6 kg)    GEN: Well nourished, well developed in no acute distress NECK: No JVD; No carotid bruits CARDIAC: RRR, no murmurs, rubs, gallops RESPIRATORY:  Clear to auscultation without rales, wheezing or rhonchi  ABDOMEN: Soft, non-tender, non-distended EXTREMITIES:  No edema; No deformity  ASSESSMENT AND PLAN: .   Assessment and Plan    Hyperlipidemia Recent non-fasting labs showed elevated triglycerides. Patient was not fasting at the time of the test. -Return for fasting lipid panel.  Renal Function Recent labs showed a decrease in kidney function with creatinine of 1.37 (previously 1.0-1.1). -Return for fasting basic metabolic panel to reassess kidney function.  Atrial Fibrillation and Flutter No recurrence of symptoms. Patient has a loop recorder and uses a cardio mobile device. -Continue current monitoring strategy.  Hypertension Well controlled. -  Continue current management.  Coronary Artery Disease Patient has an elevated calcium score (385, 82nd percentile) but no symptoms of angina. LDL was 54 last year. -Continue Aspirin 81mg  daily. Lipids as above.   Follow-up -Return in 1 year for routine follow-up. -See back sooner if any issues arise.               Follow-up: Return in about 1 year (around 01/31/2024).  Time Spent with Patient: I have spent a total of 25 minutes caring for this patient today face to face, ordering and reviewing labs/tests, reviewing prior records/medical history, examining the patient, establishing an assessment and plan, communicating results/findings to the patient/family, and documenting in the medical record.   Signed, Lenna Gilford. Flora Lipps, MD, [facility]  [facility]  [address], [address] [place], [place] [address] [phone]  8:42 AM

## 2023-02-06 DIAGNOSIS — E782 Mixed hyperlipidemia: Secondary | ICD-10-CM | POA: Diagnosis not present

## 2023-02-07 LAB — BASIC METABOLIC PANEL
BUN/Creatinine Ratio: 13 (ref 10–24)
BUN: 15 mg/dL (ref 8–27)
CO2: 28 mmol/L (ref 20–29)
Calcium: 9.3 mg/dL (ref 8.6–10.2)
Chloride: 99 mmol/L (ref 96–106)
Creatinine, Ser: 1.18 mg/dL (ref 0.76–1.27)
Glucose: 100 mg/dL — ABNORMAL HIGH (ref 70–99)
Potassium: 5.2 mmol/L (ref 3.5–5.2)
Sodium: 138 mmol/L (ref 134–144)
eGFR: 68 mL/min/{1.73_m2} (ref >59)

## 2023-02-07 LAB — LIPID PANEL
Cholesterol, Total: 107 mg/dL (ref 100–199)
HDL: 45 mg/dL (ref >39)
LDL CALC COMMENT:: 2.4 ratio (ref 0.0–5.0)
LDL Chol Calc (NIH): 42 mg/dL (ref 0–99)
Triglycerides: 107 mg/dL (ref 0–149)
VLDL Cholesterol Cal: 20 mg/dL (ref 5–40)

## 2023-03-06 ENCOUNTER — Ambulatory Visit (INDEPENDENT_AMBULATORY_CARE_PROVIDER_SITE_OTHER): Payer: Medicare Other

## 2023-03-06 DIAGNOSIS — I4891 Unspecified atrial fibrillation: Secondary | ICD-10-CM

## 2023-03-07 LAB — CUP PACEART REMOTE DEVICE CHECK
Date Time Interrogation Session: 20250105230042
Implantable Pulse Generator Implant Date: 20210923

## 2023-03-10 ENCOUNTER — Other Ambulatory Visit (HOSPITAL_COMMUNITY): Payer: Self-pay | Admitting: Urology

## 2023-03-10 DIAGNOSIS — C61 Malignant neoplasm of prostate: Secondary | ICD-10-CM

## 2023-03-15 ENCOUNTER — Encounter (HOSPITAL_COMMUNITY)
Admission: RE | Admit: 2023-03-15 | Discharge: 2023-03-15 | Disposition: A | Discharge status: Home / Self Care | Payer: Medicare Other | Source: Ambulatory Visit | Attending: Urology | Admitting: Urology

## 2023-03-15 DIAGNOSIS — C61 Malignant neoplasm of prostate: Secondary | ICD-10-CM | POA: Insufficient documentation

## 2023-03-15 MED ORDER — FLOTUFOLASTAT F 18 GALLIUM 296-5846 MBQ/ML IV SOLN
8.0000 | Freq: Once | INTRAVENOUS | Status: AC
  Administered 2023-03-15: 7.61 via INTRAVENOUS

## 2023-03-17 ENCOUNTER — Encounter (HOSPITAL_COMMUNITY): Payer: Medicare Other

## 2023-03-23 ENCOUNTER — Telehealth: Payer: Self-pay

## 2023-03-23 NOTE — Telephone Encounter (Signed)
I called pt to introduce myself as the Coordinator of the [facility].   1. I confirmed with the patient he is aware of his referral to the clinic 2/11, arriving @ 12:30 pm.    2. I discussed the format of the clinic and the physicians he will be seeing that day.   3. I discussed where the clinic is located and how to contact me.   4. I confirmed his address and informed him I would be mailing a packet of information and forms to be completed. I asked him to bring them with him the day of his appointment.    He voiced understanding of the above. I asked him to call me if he has any questions or concerns regarding his appointments or the forms he needs to complete.

## 2023-04-10 ENCOUNTER — Ambulatory Visit (INDEPENDENT_AMBULATORY_CARE_PROVIDER_SITE_OTHER): Payer: Medicare Other

## 2023-04-10 DIAGNOSIS — I4891 Unspecified atrial fibrillation: Secondary | ICD-10-CM

## 2023-04-10 LAB — CUP PACEART REMOTE DEVICE CHECK
Date Time Interrogation Session: 20250209230520
Implantable Pulse Generator Implant Date: 20210923

## 2023-04-10 NOTE — Progress Notes (Signed)
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  Labs showed slow rising PSA since 11/2020. Most recent 0.23 in 02/2022 from 0.19. PSMA PET from 03/23/23 showed no evidence of prostate cancer recurrence or metastasis.  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 The patient was counseled on the natural history of prostate cancer and the standard treatment options that are available for prostate cancer.  He voiced importance of QoL and effect from prostate cancer treatment like ADT esp. on his sexual drive. Discussed ADT will result in impotence and other side effects. Delaying use of ADT can avoid this side effect. Radiation can also delay use of ADT and recurrence. He can follow recommendation from radiation oncology.  He is very interested in genetic testing. This is reasonable and can refer to genetic counselor for evaluation in a non-urgent basis and discuss risk and benefit of testing. This would not have immediate impact on his treatment at  this time point. If he were to progress to recurrent disease in the future, will be useful. He asked about testing on his tissue. Discussed tissue testing or NGS can be considered in the future once he progressed to stage IV setting like mCRPC. It is not needed right now.  Patient will follow-up with radiation oncology or urologic oncology for discussion of additional treatment.  He may follow-up with medical oncology as needed.  Supportive calcium (1000-1200 mg daily from food and supplements) and vitamin D3 (1000 IU daily) Control and prevent diabetes Weight-bearing exercises (30 minutes per day) Limit alcohol consumption and avoid smoking  All questions were answered. The patient knows to call the clinic with any problems, questions or concerns. No barriers to learning was detected.  Melven Sartorius, MD 2/11/20255:40 PM  CHIEF COMPLAINTS/PURPOSE OF CONSULTATION:  Prostate cancer  HISTORY OF PRESENTING ILLNESS:  PIERO MUSTARD 68 y.o. male is here because of prostate cancer.  He is still working as a Production designer, theatre/television/film 3 days ago. No physical limitation.   I have reviewed his chart and materials related to his cancer extensively and collaborated history with the patient. 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)  07/23/2020 Cancer Staging   Staging form: Prostate, AJCC 8th Edition - Clinical stage from 07/23/2020: Stage IIC (cT1c, cN0, cM0, PSA: 5.7, Grade Group: 3) - Signed by Marcello Fennel, PA-C on 04/11/2023 Histopathologic type: Adenocarcinoma, NOS Stage prefix: Initial diagnosis Prostate specific antigen (PSA) range: Less than 10 Gleason primary pattern: 4 Gleason secondary pattern: 3 Gleason score: 7 Histologic grading system: 5 grade system Number of biopsy cores examined: 12  Number of biopsy cores positive: 4 Location of positive needle core biopsies: Both sides   09/08/2020 Initial Diagnosis    Malignant neoplasm of prostate (HCC)   09/10/2020 Pathology Results   RP and RPLND: 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 Cribriform gland.    09/10/2020 Cancer Staging   Staging form: Prostate, AJCC 8th Edition - Pathologic stage from 09/10/2020: Stage IIIB (pT3a, pN0, cM0, PSA: 5.7, Grade Group: 3) - Signed by Marcello Fennel, PA-C on 04/11/2023 Histopathologic type: Adenocarcinoma, NOS Stage prefix: Initial diagnosis Prostate specific antigen (PSA) range: Less than 10 Gleason primary pattern: 4 Gleason secondary pattern: 3 Gleason score: 7 Histologic grading system: 5 grade system Residual tumor (R): R0 - None   03/08/2023 Tumor Marker   PSA 12/10/20 <0.015 06/11/21 0.033 11/30/21 0.056 03/21/22 0.106 06/21/22 0.139 10/03/22 0.16 12/26/22 0.19 03/08/23 0.23   03/15/2023 PET scan   1. No evidence of prostate cancer recurrence in the prostatectomy bed. 2. No evidence of metastatic adenopathy in the pelvis or periaortic retroperitoneum. 3. No evidence of visceral metastasis or skeletal metastasis.     MEDICAL HISTORY:  Past Medical History:  Diagnosis Date   Atrial fibrillation (HCC)    Cancer [facility])    prostate   Coronary artery disease    Hyperlipidemia    Hypertension    Presence of Watchman left atrial appendage closure device 05/27/2021   s/p Watchman FLX 27mm with Dr. Lalla Brothers    SURGICAL HISTORY: Past Surgical History:  Procedure Laterality Date   APPENDECTOMY     ATRIAL FIBRILLATION ABLATION N/A 01/03/2020   Procedure: ATRIAL FIBRILLATION ABLATION;  Surgeon: Lanier Prude, MD;  Location: [facility] INVASIVE CV LAB;  Service: Cardiovascular;  Laterality: N/A;   COLONOSCOPY  06/2022   HERNIA REPAIR     LEFT ATRIAL APPENDAGE OCCLUSION N/A 05/27/2021   Procedure: LEFT ATRIAL APPENDAGE OCCLUSION;  Surgeon: Lanier Prude, MD;  Location: [facility] INVASIVE CV LAB;  Service: Cardiovascular;  Laterality: N/A;   LOOP RECORDER INSERTION N/A  11/21/2019   Procedure: LOOP RECORDER INSERTION;  Surgeon: Lanier Prude, MD;  Location: [facility] INVASIVE CV LAB;  Service: Cardiovascular;  Laterality: N/A;   LUMBAR DISC SURGERY     LYMPHADENECTOMY Bilateral 09/10/2020   Procedure: LYMPHADENECTOMY, PELVIC;  Surgeon: Heloise Purpura, MD;  Location: [facility] ORS;  Service: Urology;  Laterality: Bilateral;   ROBOT ASSISTED LAPAROSCOPIC RADICAL PROSTATECTOMY N/A 09/10/2020   Procedure: XI ROBOTIC ASSISTED LAPAROSCOPIC RADICAL PROSTATECTOMY LEVEL 2;  Surgeon: Heloise Purpura, MD;  Location: [facility] ORS;  Service: Urology;  Laterality: N/A;   TEE WITHOUT CARDIOVERSION N/A 05/27/2021   Procedure: TRANSESOPHAGEAL ECHOCARDIOGRAM (TEE);  Surgeon: Lanier Prude, MD;  Location: [facility] INVASIVE CV LAB;  Service: Cardiovascular;  Laterality: N/A;   VASECTOMY     WISDOM TOOTH EXTRACTION      SOCIAL HISTORY: Social History   Socioeconomic History   Marital status: Married    Spouse name: Not on file   Number of children: 1   Years of education: Not on file   Highest education level: Not on file  Occupational History   Occupation: Geographical information systems officer  Tobacco Use   Smoking status: Former    Current packs/day: 0.00    Average packs/day: 0.5 packs/day for 6.0 years (3.0 ttl pk-yrs)    Types: Cigarettes    Start date: 37    Quit date: 1987    Years since quitting: 38.1   Smokeless tobacco: Never  Vaping  Use   Vaping status: Never Used  Substance and Sexual Activity   Alcohol use: Never   Drug use: Never   Sexual activity: Yes  Other Topics Concern   Not on file  Social History Narrative   Not on file   Social Drivers of Health   Financial Resource Strain: Not on file  Food Insecurity: No Food Insecurity (04/11/2023)   Hunger Vital Sign    Worried About Running Out of Food in the Last Year: Never true    Ran Out of Food in the Last Year: Never true  Transportation Needs: No Transportation Needs (04/11/2023)   PRAPARE - Therapist, art (Medical): No    Lack of Transportation (Non-Medical): No  Physical Activity: Not on file  Stress: Not on file  Social Connections: Not on file  Intimate Partner Violence: Not At Risk (04/11/2023)   Humiliation, Afraid, Rape, and Kick questionnaire    Fear of Current or Ex-Partner: No    Emotionally Abused: No    Physically Abused: No    Sexually Abused: No    FAMILY HISTORY: Family History  Problem Relation Age of Onset   Heart attack Maternal Grandfather     ALLERGIES:  is allergic to escitalopram and rosuvastatin calcium.  MEDICATIONS:  Current Outpatient Medications  Medication Sig Dispense Refill   aspirin EC 81 MG tablet Take 1 tablet (81 mg total) by mouth daily. Swallow whole. 90 tablet 3   Coenzyme Q10 50 MG CAPS Take 100 mg by mouth at bedtime.     COMIRNATY syringe      cyclobenzaprine (FLEXERIL) 10 MG tablet Take 10 mg by mouth at bedtime.      ezetimibe-simvastatin (VYTORIN) 10-20 MG tablet Take 1 tablet by mouth at bedtime.     FLUZONE HIGH-DOSE 0.5 ML injection      HYDROcodone-acetaminophen (NORCO/VICODIN) 5-325 MG tablet Take 1-2 tablets by mouth every 6 (six) hours as needed for moderate pain. 15 tablet 0   lisinopril-hydrochlorothiazide (ZESTORETIC) 10-12.5 MG tablet Take 1 tablet by mouth daily.     metoprolol succinate (TOPROL-XL) 50 MG 24 hr tablet TAKE ONE TABLET BY MOUTH EVERY MORNING AND TAKE 1/2 TABLET BY MOUTH EVERY EVENING WITH OR IMMEDIATELY FOLLOWING A MEAL 135 tablet 3   Multiple Vitamins-Minerals (CENTRUM SILVER ADULT 50+) TABS Take 1 tablet by mouth daily.     sildenafil (VIAGRA) 100 MG tablet Take 100 mg by mouth daily as needed for erectile dysfunction.     UNABLE TO FIND Med Name: Trimix Injections for erectile dysfunction as needed.     No current facility-administered medications for this visit.    REVIEW OF SYSTEMS:   All relevant systems were reviewed with the patient and are negative.  PHYSICAL  EXAMINATION: ECOG PERFORMANCE STATUS: 0 - Asymptomatic  GENERAL: alert, no distress and comfortable  LABORATORY DATA:  I have reviewed the data as listed Lab Results  Component Value Date   WBC 7.5 05/24/2021   HGB 15.6 05/24/2021   HCT 46.1 05/24/2021   MCV 92 05/24/2021   PLT 223 05/24/2021   Recent Labs    02/06/23 0836  NA 138  K 5.2  CL 99  CO2 28  GLUCOSE 100*  BUN 15  CREATININE 1.18  CALCIUM 9.3    RADIOGRAPHIC STUDIES: I have personally reviewed the radiological images as listed and agreed with the findings in the report. CUP PACEART REMOTE DEVICE CHECK Result Date: 04/10/2023 ILR summary report received. Battery status  OK. Normal device function. No new symptom, tachy, brady, or pause episodes. No new AF episodes. AF burden is 0% of the time.   Monthly summary reports and ROV/PRN ML, CVRS  NM PET (PSMA) SKULL TO MID THIGH Result Date: 03/23/2023 CLINICAL DATA:  Prostate carcinoma with biochemical recurrence. EXAM: NUCLEAR MEDICINE PET SKULL BASE TO THIGH TECHNIQUE: 7.6 mCi F18 Piflufolastat (Pylarify) was injected intravenously. Full-ring PET imaging was performed from the skull base to thigh after the radiotracer. CT data was obtained and used for attenuation correction and anatomic localization. COMPARISON:  None Available. FINDINGS: NECK No radiotracer activity in neck lymph nodes. Incidental CT finding: None. CHEST No radiotracer accumulation within mediastinal or hilar lymph nodes. No suspicious pulmonary nodules on the CT scan. Incidental CT finding: None. ABDOMEN/PELVIS Prostate: No radiotracer activity in the prostatectomy bed. Bladder tips flow in the pelvic floor following prostatectomy. Lymph nodes: No abnormal radiotracer accumulation within pelvic or abdominal nodes. Liver: No evidence of liver metastasis. Incidental CT finding: None. SKELETON No focal activity to suggest skeletal metastasis. IMPRESSION: 1. No evidence of prostate cancer recurrence in the  prostatectomy bed. 2. No evidence of metastatic adenopathy in the pelvis or periaortic retroperitoneum. 3. No evidence of visceral metastasis or skeletal metastasis. Electronically Signed   By: Genevive Bi M.D.   On: 03/23/2023 14:57

## 2023-04-10 NOTE — Progress Notes (Signed)
                               Care Plan Summary  Name: Lonnie Palmer DOB: 16-Nov-1955   Your Medical Team:   Urologist -  Dr. Heloise Purpura, [facility]  Radiation Oncologist - Dr. Margaretmary Dys, [facility]   Medical Oncologist - Dr. Geanie Berlin, [facility]  Recommendations: 1) Salvage Radiation     * These recommendations are based on information available as of today's consult.      Recommendations may change depending on the results of further tests or exams.    Next Steps: 1) Consider all your options and contact Marisue Ivan, your nurse navigator, with any questions or decisions.     When appointments need to be scheduled, you will be contacted by [facility] and/or [facility].  Questions?  Please do not hesitate to call Cherlyn Cushing, BSN, RN at [phone] with any questions or concerns.  Marisue Ivan is your Oncology Nurse Navigator and is available to assist you while you're receiving your medical care at [facility].

## 2023-04-11 ENCOUNTER — Encounter: Payer: Self-pay | Admitting: Urology

## 2023-04-11 ENCOUNTER — Ambulatory Visit
Admission: RE | Admit: 2023-04-11 | Discharge: 2023-04-11 | Disposition: A | Discharge status: Home / Self Care | Payer: Medicare Other | Source: Ambulatory Visit | Attending: Radiation Oncology | Admitting: Radiation Oncology

## 2023-04-11 ENCOUNTER — Inpatient Hospital Stay: Payer: Medicare Other

## 2023-04-11 ENCOUNTER — Encounter: Payer: Self-pay | Admitting: Radiation Oncology

## 2023-04-11 VITALS — BP 146/74 | HR 88 | Temp 97.7°F | Resp 18 | Ht 73.0 in | Wt 184.0 lb

## 2023-04-11 DIAGNOSIS — C61 Malignant neoplasm of prostate: Secondary | ICD-10-CM | POA: Diagnosis present

## 2023-04-11 DIAGNOSIS — R9721 Rising PSA following treatment for malignant neoplasm of prostate: Secondary | ICD-10-CM

## 2023-04-11 DIAGNOSIS — Z7982 Long term (current) use of aspirin: Secondary | ICD-10-CM | POA: Diagnosis not present

## 2023-04-11 DIAGNOSIS — I10 Essential (primary) hypertension: Secondary | ICD-10-CM | POA: Diagnosis not present

## 2023-04-11 DIAGNOSIS — Z79899 Other long term (current) drug therapy: Secondary | ICD-10-CM | POA: Diagnosis not present

## 2023-04-11 DIAGNOSIS — E785 Hyperlipidemia, unspecified: Secondary | ICD-10-CM | POA: Diagnosis not present

## 2023-04-11 DIAGNOSIS — I4891 Unspecified atrial fibrillation: Secondary | ICD-10-CM | POA: Insufficient documentation

## 2023-04-11 DIAGNOSIS — Z87891 Personal history of nicotine dependence: Secondary | ICD-10-CM | POA: Diagnosis not present

## 2023-04-11 DIAGNOSIS — I2510 Atherosclerotic heart disease of native coronary artery without angina pectoris: Secondary | ICD-10-CM | POA: Insufficient documentation

## 2023-04-11 DIAGNOSIS — Z191 Hormone sensitive malignancy status: Secondary | ICD-10-CM | POA: Diagnosis not present

## 2023-04-11 NOTE — Consult Note (Signed)
Multi-Disciplinary Clinic     04/11/2023   --------------------------------------------------------------------------------   Lonnie Palmer  MRN: [id number]  DOB: May 23, 1955, 68 year old Male  SSN:    PRIMARY CARE:  Deatra James  PRIMARY CARE FAX:  [phone]  REFERRING:  Azucena Kuba  PROVIDER:  Heloise Purpura, M.D.  LOCATION:  [facility] [phone]     --------------------------------------------------------------------------------   CC/HPI: CC: Prostate Cancer   PCP: Dr. Deatra James  Location of consult: [facility] - Prostate Cancer Multidisciplinary [facility]   Lonnie Palmer is a 68 year old gentleman with a history of CAD, atrial fibrillation, and hypertension who had undergone a robotic assisted laparoscopic radical prostatectomy and bilateral pelvic lymph node dissection on 09/10/2020. Surgical pathology indicated a pT3a N0 Mx, Gleason 4+3=7 adenocarcinoma with negative surgical margins. His PSA prior to treatment was 5.71 and was completely undetectable postoperatively. His PSA increased to 0.11 in January 2024. This then gradually continued to increase to 0.14 in April 2024, 0.16 in August 2024 and eventually to 0.23 on 03/07/2023. A PSMA PET scan on 03/15/2023 performed and indicated no evidence of uptake in the pelvis or otherwise.   He has regained full continence. He did have good preoperative erectile function prior to surgery and underwent a unilateral nerve sparing prostatectomy. He has been very concerned about erectile function and very aggressive with his rehabilitation utilizing both oral medication, VED, and intracavernosal injection therapy. Currently, he is now obtaining erections adequate for intercourse reliably with medication and even often without medication. SHIM score is 24.   He presents today to discuss options about management for his biochemically recurrent prostate cancer.     ALLERGIES: No Allergies     MEDICATIONS: Lisinopril  Metoprolol Succinate 50 mg tablet, extended release 24 hr  Sildenafil Citrate 100 mg tablet 1 tablet PO as directed PRN  Viagra 100 mg tablet  Co Q10  Hydrocodone-Acetaminophen 5 mg-325 mg tablet  Trimix (20 PGE/0.5 PHE/30 PAP) In 1 Ml Vials Inject 0.2-0.5 ml as directed.  Zetia  Zocor 20 mg tablet     GU PSH: Penile Injection - 2023 Robotic Radical Prostatectomy - 2022 Vasectomy       PSH Notes: laminectomy 1984/1988     NON-GU PSH: Heart Surgery (Unspecified), Left - 05/28/2021 Hernia Repair     GU PMH: ED due to arterial insufficiency - 01/04/2023, - 06/29/2022, - 12/22/2021, - 06/18/2021, - 2023, - 02/26/2021, - 12/16/2020, - 2022 Prostate Cancer - 01/04/2023, - 06/29/2022, - 12/22/2021, - 06/18/2021, - 01/12/2021, - 12/16/2020, - 2022, - 2022, - 2022 Stress Incontinence - 01/04/2023, - 02/14/2022, - 12/28/2021, - 12/22/2021, - 11/30/2021, - 10/26/2021, - 09/22/2021, - 07/21/2021, - 06/18/2021, - 06/18/2021, - 04/21/2021, - 2023, - 01/12/2021, - 12/28/2020, - 12/16/2020, - 12/10/2020, - 11/23/2020, - 11/03/2020, - 10/23/2020, - 10/15/2020, - 2022, - 2022, - 2022      PMH Notes:   1) Prostate cancer: He is s/p a UNS RAL radical prostatectomy and BPLND on 09/10/20.   Diagnosis: pT3a N0 Mx, Gleason 4+3=7 adenocarcinoma with negative surgical margins  Pretreatment PSA: 5.71  Pretreatment SHIM score: 23   NON-GU PMH: Muscle weakness (generalized) - 02/14/2022, - 12/28/2021, - 11/30/2021, - 10/26/2021, - 09/22/2021, - 07/21/2021, - 06/18/2021 Other muscle spasm - 02/14/2022, - 12/28/2021, - 11/30/2021, - 10/26/2021, - 09/22/2021, - 07/21/2021, - 06/18/2021 Coronary Artery Disease - 2022 Atrial Fibrillation Hypercholesterolemia Hypertension    FAMILY HISTORY: 1 son - Son father deceased - Father mother deceased -  Mother   SOCIAL HISTORY: Marital Status: Married Preferred Language: English; Ethnicity: Not Hispanic Or Latino Current Smoking Status: Patient does not smoke  anymore.   Tobacco Use Assessment Completed: Used Tobacco in last 30 days? Has never drank.     REVIEW OF SYSTEMS:    GU Review Male:   Patient denies frequent urination, hard to postpone urination, burning/ pain with urination, get up at night to urinate, leakage of urine, stream starts and stops, trouble starting your streams, and have to strain to urinate .  Gastrointestinal (Lower):   Patient denies diarrhea and constipation.  Gastrointestinal (Upper):   Patient denies nausea and vomiting.  Constitutional:   Patient denies fever, night sweats, weight loss, and fatigue.  Skin:   Patient denies skin rash/ lesion and itching.  Eyes:   Patient denies blurred vision and double vision.  Ears/ Nose/ Throat:   Patient denies sore throat and sinus problems.  Hematologic/Lymphatic:   Patient denies swollen glands and easy bruising.  Cardiovascular:   Patient denies leg swelling and chest pains.  Respiratory:   Patient denies cough and shortness of breath.  Endocrine:   Patient denies excessive thirst.  Musculoskeletal:   Patient denies back pain and joint pain.  Neurological:   Patient denies headaches and dizziness.  Psychologic:   Patient denies depression and anxiety.   VITAL SIGNS: None   MULTI-SYSTEM PHYSICAL EXAMINATION:    Constitutional: Well-nourished. No physical deformities. Normally developed. Good grooming.     Complexity of Data:  Lab Test Review:   PSA  Records Review:   Previous Patient Records  X-Ray Review: PET- PSMA Scan: Reviewed Films.     03/07/23 12/26/22 10/03/22 06/21/22 03/21/22 11/30/21 06/11/21 12/10/20  PSA  Total PSA 0.23 ng/mL 0.19 ng/mL 0.16 ng/mL 0.139 ng/mL 0.106 ng/mL 0.056 ng/mL 0.033 ng/mL <0.015 ng/mL   Notes:                     CLINICAL DATA: Prostate carcinoma with biochemical recurrence.   EXAM:  NUCLEAR MEDICINE PET SKULL BASE TO THIGH   TECHNIQUE:  7.6 mCi F18 Piflufolastat (Pylarify) was injected intravenously.  Full-ring PET imaging  was performed from the skull base to thigh  after the radiotracer. CT data was obtained and used for attenuation  correction and anatomic localization.   COMPARISON: None Available.   FINDINGS:  NECK   No radiotracer activity in neck lymph nodes.   Incidental CT finding: None.   CHEST   No radiotracer accumulation within mediastinal or hilar lymph nodes.  No suspicious pulmonary nodules on the CT scan.   Incidental CT finding: None.   ABDOMEN/PELVIS   Prostate: No radiotracer activity in the prostatectomy bed. Bladder  tips flow in the pelvic floor following prostatectomy.   Lymph nodes: No abnormal radiotracer accumulation within pelvic or  abdominal nodes.   Liver: No evidence of liver metastasis.   Incidental CT finding: None.   SKELETON   No focal activity to suggest skeletal metastasis.   IMPRESSION:  1. No evidence of prostate cancer recurrence in the prostatectomy  bed.  2. No evidence of metastatic adenopathy in the pelvis or periaortic  retroperitoneum.  3. No evidence of visceral metastasis or skeletal metastasis.    Electronically Signed  By: Genevive Bi M.D.  On: 03/23/2023 14:57   PROCEDURES: None   ASSESSMENT:      ICD-10 Details  1 GU:   Prostate Cancer - C61    PLAN:  Schedule Labs: 2 Months - Urinalysis    2 Months - PSA  Return Visit/Planned Activity: 2 Months - Office Visit          Document Letter(s):  Created for Patient: Clinical Summary         Notes:   1. Biochemically recurrent prostate cancer: We reviewed his PSMA PET scan results today which do not indicate evidence of uptake either locally or evidence of metastatic disease. We did discuss his risk factors for local recurrence and options for salvage curative treatment that would involve salvage radiation therapy possibly plus short-term androgen deprivation therapy. Fortunately, he is doing quite well from a quality-of-life standpoint. He does have full  continence and has now seen significant improvement of his erections over the past few months and is reliably obtaining erections with medication and often even without. He does place a high priority on his quality of life. For these reasons, he is still somewhat hesitant about salvage therapy and is fairly adamant that he does not wish to proceed with androgen deprivation therapy even in the setting of salvage radiation therapy.   He is meeting with Dr. Kathrynn Running and Dr. Cherly Hensen today. He also has a second opinion at [facility] as well. At this time, he would like to tentatively schedule an appointment for a few months with his next PSA to further assess his PSA doubling time and to give him more opportunity to consider his options. He is possibly considering continued monitoring of his PSA with plans to repeat a PSMA PET scan when his PSA is about 0.5. He does understand some risk involved with delaying salvage therapy.   2. Erectile dysfunction: This is improving. He will continue PDE 5 inhibitor therapy.   CC: Dr. Deatra James  Dr. Margaretmary Dys  Dr. Geanie Berlin        Next Appointment:      Next Appointment: 09/06/2023 07:30 AM    Appointment Type: Laboratory Appointment    Location: [facility] [phone]    Provider: Lab LAB    Reason for Visit: 6 Months - PSA      E & M CODES: We spent 38 minutes dedicated to evaluation and management time, including face to face interaction, discussions on coordination of care, documentation, result review, and discussion with others as applicable.

## 2023-04-11 NOTE — Progress Notes (Incomplete)
   RP July 2022

## 2023-04-11 NOTE — Progress Notes (Signed)
Radiation Oncology         [phone] ________________________________  Multidisciplinary Prostate Cancer Clinic  Initial Radiation Oncology Consultation  Name: Lonnie Palmer MRN: [id number]  Date: 04/11/2023  DOB: [birth year]  CC:Sun, Charise Carwin, MD  Heloise Purpura, MD   REFERRING PHYSICIAN: Heloise Purpura, MD  DIAGNOSIS: 68 y.o. gentleman with biochemically recurrent prostate cancer denoted by a rising PSA of 0.23 s/p RALP 08/2020 for Stage pT3aN0, Gleason 4+3 adenocarcinoma the prostate.    ICD-10-CM   1. Malignant neoplasm of prostate (HCC)  C61       HISTORY OF PRESENT ILLNESS::Lonnie Palmer is a 68 y.o. gentleman with biochemically recurrent prostate cancer. He was initially referred to Dr. Annabell Howells for an elevated PSA of 5.71. He was subsequently diagnosed with Gleason 4+3 prostate cancer on biopsy performed 07/23/20. We met with the patient on 09/08/20 to discuss radiation treatment options. He ultimately opted to proceed with RALP on 09/10/20 under the care of Dr. Laverle Patter.  Surgical pathology confirmed pT3aN0 Gleason 4+3 prostatic adenocarcinoma with extraprostatic extension present at the left posterolateral mid gland. Seminal vesicles, margins, and all three sampled lymph nodes were all negative. His postoperative PSA was undetectable in October 2022 but became detectable at 0.033 in 05/2021 and continued to gradually rise. His most recent PSA from 03/08/23 was 0.23. He underwent PSMA PET scan on 03/15/23 showing no evidence of active disease.  The patient reviewed the pathology, imaging and PSA results with his urologist and he has kindly been referred today to the multidisciplinary prostate cancer clinic for presentation of pathology and radiology studies in our conference for discussion of potential radiation treatment options and clinical evaluation.   PREVIOUS RADIATION THERAPY: No  PAST MEDICAL HISTORY:  has a past medical history of Atrial fibrillation (HCC), Cancer (HCC),  Coronary artery disease, Hyperlipidemia, Hypertension, and Presence of Watchman left atrial appendage closure device (05/27/2021).    PAST SURGICAL HISTORY: Past Surgical History:  Procedure Laterality Date   APPENDECTOMY     ATRIAL FIBRILLATION ABLATION N/A 01/03/2020   Procedure: ATRIAL FIBRILLATION ABLATION;  Surgeon: Lanier Prude, MD;  Location: [facility]VASIVE CV [facility];  Service: Cardiovascular;  Laterality: N/A;   COLONOSCOPY  06/2022   HERNIA REPAIR     LEFT ATRIAL APPENDAGE OCCLUSION N/A 05/27/2021   Procedure: LEFT ATRIAL APPENDAGE OCCLUSION;  Surgeon: Lanier Prude, MD;  Location: [facility];  Service: Cardiovascular;  Laterality: N/A;   LOOP RECORDER INSERTION N/A 11/21/2019   Procedure: LOOP RECORDER INSERTION;  Surgeon: Lanier Prude, MD;  Location: [facility];  Service: Cardiovascular;  Laterality: N/A;   LUMBAR DISC SURGERY     LYMPHADENECTOMY Bilateral 09/10/2020   Procedure: LYMPHADENECTOMY, PELVIC;  Surgeon: Heloise Purpura, MD;  Location: [facility];  Service: Urology;  Laterality: Bilateral;   ROBOT ASSISTED LAPAROSCOPIC RADICAL PROSTATECTOMY N/A 09/10/2020   Procedure: XI ROBOTIC ASSISTED LAPAROSCOPIC RADICAL PROSTATECTOMY LEVEL 2;  Surgeon: Heloise Purpura, MD;  Location: [facility];  Service: Urology;  Laterality: N/A;   TEE WITHOUT CARDIOVERSION N/A 05/27/2021   Procedure: TRANSESOPHAGEAL ECHOCARDIOGRAM (TEE);  Surgeon: Lanier Prude, MD;  [facility] IN[facility] CV [facility];  Service: Cardiovascular;  Laterality: N/A;   VASECTOMY     WISDOM TOOTH EXTRACTION      FAMILY HISTORY: family history includes Heart attack in his maternal grandfather.  SOCIAL HISTORY:  reports that he quit smoking about 38 years ago. His smoking use included cigarettes. He started smoking about 44 years ago. He has a  3 pack-year smoking history. He has never used smokeless tobacco. He reports that he does not drink alcohol and does not use drugs.  ALLERGIES: Escitalopram and  Rosuvastatin calcium  MEDICATIONS:  Current Outpatient Medications  Medication Sig Dispense Refill   COMIRNATY syringe      FLUZONE HIGH-DOSE 0.5 ML injection      aspirin EC 81 MG tablet Take 1 tablet (81 mg total) by mouth daily. Swallow whole. 90 tablet 3   Coenzyme Q10 50 MG CAPS Take 100 mg by mouth at bedtime.     cyclobenzaprine (FLEXERIL) 10 MG tablet Take 10 mg by mouth at bedtime.      ezetimibe-simvastatin (VYTORIN) 10-20 MG tablet Take 1 tablet by mouth at bedtime.     HYDROcodone-acetaminophen (NORCO/VICODIN) 5-325 MG tablet Take 1-2 tablets by mouth every 6 (six) hours as needed for moderate pain. 15 tablet 0   lisinopril-hydrochlorothiazide (ZESTORETIC) 10-12.5 MG tablet Take 1 tablet by mouth daily.     metoprolol succinate (TOPROL-XL) 50 MG 24 hr tablet TAKE ONE TABLET BY MOUTH EVERY MORNING AND TAKE 1/2 TABLET BY MOUTH EVERY EVENING WITH OR IMMEDIATELY FOLLOWING A MEAL 135 tablet 3   Multiple Vitamins-Minerals (CENTRUM SILVER ADULT 50+) TABS Take 1 tablet by mouth daily.     sildenafil (VIAGRA) 100 MG tablet Take 100 mg by mouth daily as needed for erectile dysfunction.     UNABLE TO FIND Med Name: Trimix Injections for erectile dysfunction as needed.     No current facility-administered medications for this encounter.    REVIEW OF SYSTEMS:  On review of systems, the patient reports that he is doing well overall. He denies any chest pain, shortness of breath, cough, fevers, chills, night sweats, unintended weight changes. He denies any bowel disturbances, and denies abdominal pain, nausea or vomiting. He denies any new musculoskeletal or joint aches or pains. His IPSS was 1, indicating minimal urinary symptoms. His SHIM was 24, indicating he does not have erectile dysfunction. A complete review of systems is obtained and is otherwise negative.   PHYSICAL EXAM:  Wt Readings from Last 3 Encounters:  04/11/23 184 lb (83.5 kg)  01/31/23 183 lb (83 kg)  10/17/22 184 lb 6.4 oz  (83.6 kg)   Temp Readings from Last 3 Encounters:  04/11/23 97.7 F (36.5 C) (Temporal)  05/28/21 97.8 F (36.6 C) (Oral)  09/12/20 98.2 F (36.8 C) (Oral)   BP Readings from Last 3 Encounters:  04/11/23 (!) 146/74  01/31/23 122/78  10/17/22 130/68   Pulse Readings from Last 3 Encounters:  04/11/23 88  01/31/23 75  10/17/22 74   Pain Assessment Pain Score: 0-No pain/10  In general this is a well appearing Caucasian man in no acute distress. He's alert and oriented x4 and appropriate throughout the examination. Cardiopulmonary assessment is negative for acute distress and he exhibits normal effort.    KPS = 100  100 - Normal; no complaints; no evidence of disease. 90   - Able to carry on normal activity; minor signs or symptoms of disease. 80   - Normal activity with effort; some signs or symptoms of disease. 82   - Cares for self; unable to carry on normal activity or to do active work. 60   - Requires occasional assistance, but is able to care for most of his personal needs. 50   - Requires considerable assistance and frequent medical care. 40   - Disabled; requires special care and assistance. 30   - Severely disabled;  hospital admission is indicated although death not imminent. 20   - Very sick; hospital admission necessary; active supportive treatment necessary. 10   - Moribund; fatal processes progressing rapidly. 0     - Dead  Karnofsky DA, Abelmann WH, Craver LS and Burchenal [facility] [phone]) The use of the nitrogen mustards in the palliative treatment of carcinoma: with particular reference to bronchogenic carcinoma Cancer 1 634-56   LABORATORY DATA:  Lab Results  Component Value Date   WBC 7.5 05/24/2021   HGB 15.6 05/24/2021   HCT 46.1 05/24/2021   MCV 92 05/24/2021   PLT 223 05/24/2021   Lab Results  Component Value Date   NA 138 02/06/2023   K 5.2 02/06/2023   CL 99 02/06/2023   CO2 28 02/06/2023   No results found for: "ALT", "AST", "GGT", "ALKPHOS",  "BILITOT"   RADIOGRAPHY: CUP PACEART REMOTE DEVICE CHECK Result Date: 04/10/2023 ILR summary report received. Battery status OK. Normal device function. No new symptom, tachy, brady, or pause episodes. No new AF episodes. AF burden is 0% of the time.   Monthly summary reports and ROV/PRN ML, CVRS  NM PET (PSMA) SKULL TO MID THIGH Result Date: 03/23/2023 CLINICAL DATA:  Prostate carcinoma with biochemical recurrence. EXAM: NUCLEAR MEDICINE PET SKULL BASE TO THIGH TECHNIQUE: 7.6 mCi F18 Piflufolastat (Pylarify) was injected intravenously. Full-ring PET imaging was performed from the skull base to thigh after the radiotracer. CT data was obtained and used for attenuation correction and anatomic localization. COMPARISON:  None Available. FINDINGS: NECK No radiotracer activity in neck lymph nodes. Incidental CT finding: None. CHEST No radiotracer accumulation within mediastinal or hilar lymph nodes. No suspicious pulmonary nodules on the CT scan. Incidental CT finding: None. ABDOMEN/PELVIS Prostate: No radiotracer activity in the prostatectomy bed. Bladder tips flow in the pelvic floor following prostatectomy. Lymph nodes: No abnormal radiotracer accumulation within pelvic or abdominal nodes. Liver: No evidence of liver metastasis. Incidental CT finding: None. SKELETON No focal activity to suggest skeletal metastasis. IMPRESSION: 1. No evidence of prostate cancer recurrence in the prostatectomy bed. 2. No evidence of metastatic adenopathy in the pelvis or periaortic retroperitoneum. 3. No evidence of visceral metastasis or skeletal metastasis. Electronically Signed   By: Genevive Bi M.D.   On: 03/23/2023 14:57      IMPRESSION/PLAN: 68 y.o. gentleman with biochemically recurrent prostate cancer denoted by a rising PSA of 0.23 s/p RALP 08/2020 for Stage pT3aN0, Gleason 4+3 adenocarcinoma the prostate.  Today we reviewed the findings and workup thus far.  We discussed the natural history of prostate cancer.   We reviewed the the implications of positive margins, extracapsular extension, and seminal vesicle involvement on the risk of prostate cancer recurrence. In his case, extraprostatic extension was present and he has a detectable, rising postoperative PSA. We reviewed some of the evidence suggesting an advantage for patients who undergo salvage radiotherapy in this setting in terms of disease control and overall survival. We discussed radiation treatment directed to the prostatic fossa with regard to the logistics and delivery of external beam radiation treatment.  He was encouraged to ask questions that were answered to his stated satisfaction.  At the conclusion of our conversation, the patient is interested in continuing active surveillance, at least for the short-term. He also has a second opinion at [facility] to gain their opinion and explore any available clinical trials. At this time, he plans to tentatively schedule an appointment for repeat PSA in a few months to further assess his PSA doubling  time and consider his options. He is possibly considering continued monitoring of his PSA with plans to repeat a PSMA PET scan when his PSA is about 0.5.  We discussed the risk involved with delaying salvage therapy in great detail today and he appears to have a good understanding and accepts those risks.  We enjoyed meeting him today and look forward to continuing to follow his progress via correspondence.  We would be more than happy to continue to participate in his care if/when clinically indicated in the future.  We personally spent 60 minutes in this encounter including chart review, reviewing radiological studies, meeting face-to-face with the patient, entering orders and completing documentation.    Marguarite Arbour, PA-C    Margaretmary Dys, MD  [facility]  Radiation Oncology Direct Dial: [phone]  Fax: [phone] [email or web address]  Skype  LinkedIn   This document serves as a record of  services personally performed by Margaretmary Dys, MD and Marcello Fennel, PA-C. It was created on their behalf by Mickie Bail, a trained medical scribe. The creation of this record is based on the scribe's personal observations and the provider's statements to them. This document has been checked and approved by the attending provider.

## 2023-04-12 ENCOUNTER — Telehealth: Payer: Self-pay | Admitting: Genetic Counselor

## 2023-04-12 NOTE — Telephone Encounter (Signed)
Scheduled appointments per 2/12 scheduling message. Patient is aware of the appointments made and is active on MyChart.

## 2023-04-13 ENCOUNTER — Encounter: Payer: Self-pay | Admitting: Cardiology

## 2023-04-13 ENCOUNTER — Telehealth: Payer: Self-pay | Admitting: Genetic Counselor

## 2023-04-13 NOTE — Telephone Encounter (Signed)
Rescheduled appointments per patient request.

## 2023-04-17 ENCOUNTER — Encounter: Payer: Medicare Other | Admitting: Genetic Counselor

## 2023-04-17 ENCOUNTER — Other Ambulatory Visit: Payer: Medicare Other

## 2023-04-17 NOTE — Addendum Note (Signed)
Addended by: Geralyn Flash D on: 04/17/2023 11:34 AM   Modules accepted: Orders

## 2023-04-17 NOTE — Progress Notes (Signed)
 Carelink Summary Report / Loop Recorder

## 2023-04-18 ENCOUNTER — Other Ambulatory Visit: Payer: Self-pay

## 2023-04-18 DIAGNOSIS — R9721 Rising PSA following treatment for malignant neoplasm of prostate: Secondary | ICD-10-CM

## 2023-04-18 NOTE — Progress Notes (Signed)
RN spoke with patient to follow up after recent [facility] visit.  Patient has not heard back from [facility] yet regarding setting up an appointment for a second opinion.  RN offered to help assist with referral to [facility] for the [facility] and he is agreeable. RN faxed referral and they will contact patient to set up date.

## 2023-04-27 NOTE — Progress Notes (Signed)
 RN spoke with staff at [facility] regarding referral on patient's behalf.  They have received referral and have contacted patient via phone, and letter to inquire about getting consult set up.    RN spoke with patient, I provided him direct number for scheduling.  We will reach out to [facility].  No additional needs at this time, will continue to follow.

## 2023-05-15 ENCOUNTER — Ambulatory Visit (INDEPENDENT_AMBULATORY_CARE_PROVIDER_SITE_OTHER): Payer: Medicare Other

## 2023-05-15 DIAGNOSIS — I4891 Unspecified atrial fibrillation: Secondary | ICD-10-CM

## 2023-05-16 LAB — CUP PACEART REMOTE DEVICE CHECK
Date Time Interrogation Session: 20250316230407
Implantable Pulse Generator Implant Date: 20210923

## 2023-05-18 NOTE — Progress Notes (Signed)
 Carelink Summary Report / Loop Recorder

## 2023-05-19 NOTE — Progress Notes (Signed)
 Patient had consult with Dr. Greggory Stallion at [facility] on 3/19.   Patient will proceed with PSA check under urology on 4/1.

## 2023-05-21 ENCOUNTER — Encounter: Payer: Self-pay | Admitting: Cardiology

## 2023-06-08 ENCOUNTER — Telehealth: Payer: Self-pay | Admitting: Genetic Counselor

## 2023-06-08 NOTE — Progress Notes (Signed)
 Patient had repeat PSA on 4/1 with results of 0.32, and followed up with Dr. Laverle Patter on 4/9.  Plan to repeat PSA in July as scheduled.  Patient would still like to hold on salvage treatment, pending on results in July.

## 2023-06-08 NOTE — Telephone Encounter (Signed)
 Cancelled appointment. Lonnie Palmer stated he will call back to reschedule.

## 2023-06-16 DIAGNOSIS — E7800 Pure hypercholesterolemia, unspecified: Secondary | ICD-10-CM | POA: Diagnosis not present

## 2023-06-16 DIAGNOSIS — Z7982 Long term (current) use of aspirin: Secondary | ICD-10-CM | POA: Diagnosis not present

## 2023-06-16 DIAGNOSIS — Z79899 Other long term (current) drug therapy: Secondary | ICD-10-CM | POA: Diagnosis not present

## 2023-06-16 DIAGNOSIS — I2510 Atherosclerotic heart disease of native coronary artery without angina pectoris: Secondary | ICD-10-CM | POA: Diagnosis not present

## 2023-06-16 DIAGNOSIS — I10 Essential (primary) hypertension: Secondary | ICD-10-CM | POA: Diagnosis not present

## 2023-06-19 ENCOUNTER — Ambulatory Visit (INDEPENDENT_AMBULATORY_CARE_PROVIDER_SITE_OTHER): Payer: Medicare Other

## 2023-06-19 ENCOUNTER — Encounter: Payer: Medicare Other | Admitting: Genetic Counselor

## 2023-06-19 ENCOUNTER — Other Ambulatory Visit: Payer: Medicare Other

## 2023-06-19 DIAGNOSIS — I483 Typical atrial flutter: Secondary | ICD-10-CM | POA: Diagnosis not present

## 2023-06-20 ENCOUNTER — Encounter: Payer: Self-pay | Admitting: Cardiology

## 2023-06-20 LAB — CUP PACEART REMOTE DEVICE CHECK
Date Time Interrogation Session: 20250420230325
Implantable Pulse Generator Implant Date: 20210923

## 2023-06-24 ENCOUNTER — Other Ambulatory Visit: Payer: Self-pay | Admitting: Cardiovascular Disease

## 2023-07-03 NOTE — Progress Notes (Signed)
 Carelink Summary Report / Loop Recorder

## 2023-07-03 NOTE — Addendum Note (Signed)
 Addended by: Edra Govern D on: 07/03/2023 02:39 PM   Modules accepted: Orders

## 2023-07-11 NOTE — Progress Notes (Signed)
 Patient scheduled for PSA on 7/9.  RN will follow up with results and any new recommendations from Dr. Rozanne Corners.

## 2023-07-18 DIAGNOSIS — G894 Chronic pain syndrome: Secondary | ICD-10-CM | POA: Diagnosis not present

## 2023-07-18 DIAGNOSIS — I10 Essential (primary) hypertension: Secondary | ICD-10-CM | POA: Diagnosis not present

## 2023-07-18 DIAGNOSIS — M51379 Other intervertebral disc degeneration, lumbosacral region without mention of lumbar back pain or lower extremity pain: Secondary | ICD-10-CM | POA: Diagnosis not present

## 2023-07-18 DIAGNOSIS — E785 Hyperlipidemia, unspecified: Secondary | ICD-10-CM | POA: Diagnosis not present

## 2023-07-20 ENCOUNTER — Ambulatory Visit: Payer: Self-pay | Admitting: Cardiology

## 2023-07-20 ENCOUNTER — Ambulatory Visit (INDEPENDENT_AMBULATORY_CARE_PROVIDER_SITE_OTHER)

## 2023-07-20 DIAGNOSIS — I483 Typical atrial flutter: Secondary | ICD-10-CM | POA: Diagnosis not present

## 2023-07-20 LAB — CUP PACEART REMOTE DEVICE CHECK
Date Time Interrogation Session: 20250521231136
Implantable Pulse Generator Implant Date: 20210923

## 2023-08-09 NOTE — Addendum Note (Signed)
 Addended by: Edra Govern D on: 08/09/2023 09:51 AM   Modules accepted: Orders

## 2023-08-09 NOTE — Progress Notes (Signed)
 Carelink Summary Report / Loop Recorder

## 2023-08-16 DIAGNOSIS — Z0000 Encounter for general adult medical examination without abnormal findings: Secondary | ICD-10-CM | POA: Diagnosis not present

## 2023-08-21 ENCOUNTER — Ambulatory Visit (INDEPENDENT_AMBULATORY_CARE_PROVIDER_SITE_OTHER)

## 2023-08-21 DIAGNOSIS — I483 Typical atrial flutter: Secondary | ICD-10-CM

## 2023-08-21 LAB — CUP PACEART REMOTE DEVICE CHECK
Date Time Interrogation Session: 20250622230625
Implantable Pulse Generator Implant Date: 20210923

## 2023-08-22 ENCOUNTER — Ambulatory Visit: Payer: Self-pay | Admitting: Cardiology

## 2023-08-28 ENCOUNTER — Ambulatory Visit: Payer: Medicare Other

## 2023-08-29 DIAGNOSIS — R110 Nausea: Secondary | ICD-10-CM | POA: Diagnosis not present

## 2023-08-29 DIAGNOSIS — J3489 Other specified disorders of nose and nasal sinuses: Secondary | ICD-10-CM | POA: Diagnosis not present

## 2023-09-06 NOTE — Progress Notes (Signed)
 Carelink Summary Report / Loop Recorder

## 2023-09-21 ENCOUNTER — Ambulatory Visit: Payer: Self-pay | Admitting: Cardiology

## 2023-09-21 ENCOUNTER — Ambulatory Visit

## 2023-09-21 DIAGNOSIS — I483 Typical atrial flutter: Secondary | ICD-10-CM

## 2023-09-21 LAB — CUP PACEART REMOTE DEVICE CHECK
Date Time Interrogation Session: 20250723230921
Implantable Pulse Generator Implant Date: 20210923

## 2023-09-25 NOTE — Progress Notes (Signed)
 Carelink Summary Report / Loop Recorder

## 2023-09-28 NOTE — Progress Notes (Signed)
 Patient met with Dr. Renda on 7/29.  Patient will continue with monitoring PSA and is scheduled for PSA lab on 12/25/23.

## 2023-10-02 ENCOUNTER — Ambulatory Visit: Payer: Medicare Other

## 2023-10-20 DIAGNOSIS — M549 Dorsalgia, unspecified: Secondary | ICD-10-CM | POA: Diagnosis not present

## 2023-10-20 DIAGNOSIS — I480 Paroxysmal atrial fibrillation: Secondary | ICD-10-CM | POA: Diagnosis not present

## 2023-10-23 ENCOUNTER — Ambulatory Visit

## 2023-10-23 DIAGNOSIS — I483 Typical atrial flutter: Secondary | ICD-10-CM

## 2023-10-24 LAB — CUP PACEART REMOTE DEVICE CHECK
Date Time Interrogation Session: 20250823230554
Implantable Pulse Generator Implant Date: 20210923

## 2023-10-25 ENCOUNTER — Ambulatory Visit: Payer: Self-pay | Admitting: Cardiology

## 2023-11-06 ENCOUNTER — Ambulatory Visit: Payer: Medicare Other

## 2023-11-15 NOTE — Progress Notes (Signed)
 Remote Loop Recorder Transmission

## 2023-11-23 ENCOUNTER — Ambulatory Visit

## 2023-11-23 DIAGNOSIS — I483 Typical atrial flutter: Secondary | ICD-10-CM | POA: Diagnosis not present

## 2023-11-23 DIAGNOSIS — H35373 Puckering of macula, bilateral: Secondary | ICD-10-CM | POA: Diagnosis not present

## 2023-11-23 DIAGNOSIS — H2513 Age-related nuclear cataract, bilateral: Secondary | ICD-10-CM | POA: Diagnosis not present

## 2023-11-23 DIAGNOSIS — H43813 Vitreous degeneration, bilateral: Secondary | ICD-10-CM | POA: Diagnosis not present

## 2023-11-23 DIAGNOSIS — H43393 Other vitreous opacities, bilateral: Secondary | ICD-10-CM | POA: Diagnosis not present

## 2023-11-23 DIAGNOSIS — H33321 Round hole, right eye: Secondary | ICD-10-CM | POA: Diagnosis not present

## 2023-11-23 DIAGNOSIS — G43B0 Ophthalmoplegic migraine, not intractable: Secondary | ICD-10-CM | POA: Diagnosis not present

## 2023-11-23 LAB — CUP PACEART REMOTE DEVICE CHECK
Date Time Interrogation Session: 20250924230601
Implantable Pulse Generator Implant Date: 20210923

## 2023-11-27 NOTE — Progress Notes (Signed)
 Remote Loop Recorder Transmission

## 2023-11-29 DIAGNOSIS — D225 Melanocytic nevi of trunk: Secondary | ICD-10-CM | POA: Diagnosis not present

## 2023-11-29 DIAGNOSIS — L821 Other seborrheic keratosis: Secondary | ICD-10-CM | POA: Diagnosis not present

## 2023-11-29 DIAGNOSIS — L219 Seborrheic dermatitis, unspecified: Secondary | ICD-10-CM | POA: Diagnosis not present

## 2023-11-29 DIAGNOSIS — Z86018 Personal history of other benign neoplasm: Secondary | ICD-10-CM | POA: Diagnosis not present

## 2023-11-29 DIAGNOSIS — L814 Other melanin hyperpigmentation: Secondary | ICD-10-CM | POA: Diagnosis not present

## 2023-11-29 DIAGNOSIS — L578 Other skin changes due to chronic exposure to nonionizing radiation: Secondary | ICD-10-CM | POA: Diagnosis not present

## 2023-11-30 NOTE — Progress Notes (Signed)
 Remote Loop Recorder Transmission

## 2023-12-01 ENCOUNTER — Ambulatory Visit: Payer: Self-pay | Admitting: Cardiology

## 2023-12-11 ENCOUNTER — Ambulatory Visit: Payer: Medicare Other

## 2023-12-25 ENCOUNTER — Ambulatory Visit (INDEPENDENT_AMBULATORY_CARE_PROVIDER_SITE_OTHER)

## 2023-12-25 DIAGNOSIS — I483 Typical atrial flutter: Secondary | ICD-10-CM

## 2023-12-25 LAB — CUP PACEART REMOTE DEVICE CHECK
Date Time Interrogation Session: 20251026230922
Implantable Pulse Generator Implant Date: 20210923

## 2023-12-27 ENCOUNTER — Ambulatory Visit: Payer: Self-pay | Admitting: Cardiology

## 2023-12-28 ENCOUNTER — Other Ambulatory Visit: Payer: Self-pay | Admitting: Medical Genetics

## 2023-12-28 DIAGNOSIS — Z006 Encounter for examination for normal comparison and control in clinical research program: Secondary | ICD-10-CM

## 2023-12-29 NOTE — Progress Notes (Signed)
 GENITOURINARY ONCOLOGY CLINIC - FOLLOW UP VIRTUAL VISIT  Telemedicine video encounter   This video encounter was conducted with the patient's (or proxy's) verbal consent via secure, interactive audio and video telecommunications.    Patient was instructed to have this encounter in a suitably private space; and to only have persons present to whom they give permission to participate.  In addition, patient identity was confirmed by use of two identifiers (name and date of birth).   I spent a total of 30 minutes minutes with the patient, over half of which was spent counseling and/or coordinating care related to lab results, treatment options, and follow up plan for the patient's prostate cancer.  We are happy to address any questions you may have regarding what is written in your note at your next clinic visit, or you can call [phone] and schedule a clinic visit to discuss your note. Unfortunately, we will not be able to discuss these notes via telephone or MyChart messages.   PATIENT IDENTIFICATION  Lonnie Palmer is a 68 y.o. male from [place] [address] who is contacted in follow up for prostate cancer.   CHIEF COMPLAINT   Chief Complaint  Patient presents with  . Prostate Cancer    ONCOLOGIC HISTORY  Prostate cancer 09/10/20 S/P RALP - cT3a N0 Mx Gleason score 4+3=7 with tertiary pattern 5  (GG 3) ECE at left posterolateral mid (pT3a) Seminal vesicles, vasa deferentia and all resection margins are negative 12/10/20 PSA <0.015 06/11/21 PSA 0.033 11/30/21 PSA 0.056 03/21/22 PSA 0.139 10/03/22 PSA 0.16 12/26/22 PSA 0.19 03/15/23 PSMA PET/CT - no evidence of local recurrence or metastatic disease 12/25/23 PSA 0.60  INTERVAL HISTORY   History of Present Illness Lonnie Palmer is a 68 year old male with prostate cancer who presents for follow-up regarding rising PSA levels.  He is concerned about his rising PSA levels. He has been exploring educational resources about prostate  cancer management and is interested in treatment options beyond conventional approaches.  He is particularly concerned about the impact of treatment on his quality of life, especially regarding radiation therapy. He has regained confidence in his urinary and sexual function, stating he is 'ninety percent' back to normal, and he and Orlean are 'having the time of our lives.' He wishes to maintain this quality of life and is interested in personalized treatment approaches that consider his age and life priorities.  He has had previous consultations with other doctors, including positive experiences with Dr. Vaughn and Dr. Tina, but felt a lack of empathy from a radiation oncologist regarding his concerns about quality of life. He is interested in alternative approaches to treatment that might delay radiation therapy, especially given his age and the importance of the next five to seven years with Orlean.  He inquires about the use of ivermectin and fenbendazole, noting that he has heard discussions about these treatments but is currently not using them. He is focused on monitoring his PSA levels as the primary indicator of his cancer status.    Patient Active Problem List  Diagnosis  . Prostate cancer (CMS/HHS-HCC)    Past Medical History:  Diagnosis Date  . Arrhythmia   . Erectile dysfunction   . Hypercholesterolemia   . Hypertension   . Prostate cancer (CMS/HHS-HCC)     No current outpatient medications on file.   Allergies  Allergen Reactions  . Escitalopram Diarrhea  . Rosuvastatin Calcium Rash    Social History   Socioeconomic History  . Marital status:  Unknown  Tobacco Use  . Smoking status: Former    Current packs/day: 0.00    Types: Cigarettes    Quit date: 1987    Years since quitting: 38.8  . Smokeless tobacco: Never  Vaping Use  . Vaping status: Never Used   Social Drivers of Health   Food Insecurity: No Food Insecurity (04/11/2023)   [facility]   Hunger Vital Sign   . Within the past 12 months, you worried that your food would run out before you got the money to buy more.: Never true   . Within the past 12 months, the food you bought just didn't last and you didn't have money to get more.: Never true  Transportation Needs: No Transportation Needs (04/11/2023)   Received from [facility] - Transportation   . Lack of Transportation (Medical): No   . Lack of Transportation (Non-Medical): No  Housing Stability: Unknown (05/14/2023)   Housing Stability Vital Sign   . Homeless in the Last Year: No    ROS  See HPI for other ROS  OBJECTIVE/VIRTUAL EXAM    Vitals as reported by patient: There were no vitals filed for this visit. There is no height or weight on file to calculate BMI.  Physical exam deferred for COVID19 precautions   LABS/STUDIES    12/25/23 Outside PSA 0.60     IMPRESSION/ PLAN  [name] is a 68 yo man with prostate cancer, on the call today. Sites of disease include:  none.  Encounter Diagnosis  Name Primary?  . Prostate cancer (CMS/HHS-HCC) Yes     PLAN:  This video encounter was conducted with the patient's (or proxy's) verbal consent via secure, interactive audio and video telecommunications while away from clinic/office/hospital.  The patient (or proxy) was instructed to have this encounter in a suitably private space and to only have persons present to whom they give permission to participate. In addition, patient identity was confirmed by use of name plus two identifiers.  01-16-2020 E&M) This visit was coded based on time. I spent a total of 30 minutes in both face-to-face and non-face-to-face activities for this visit on the date of this encounter.  I believe I answered all questions to the patient's satisfaction.  Assessment & Plan Prostate cancer   Rising PSA levels suggest potential disease progression. He prioritizes quality of life, particularly avoiding early radiation due to  concerns about sexual function and urinary control. He understands the limitations of PSMA PET scans and the potential for false negatives, while alternative scans like choline C11 and oxypaximin F18 are considered inferior. Ivermectin or fenbendazole use is discouraged as it could complicate PSA interpretation. The decision-making process balances disease control with quality of life, and he is comfortable with a personalized approach that may delay radiation if it aligns with his priorities. Order a PET scan to assess for disease progression and repeat the PSA test to monitor levels. Coordinate with Dr. Renda and Dr. Cena for ongoing management and communication. Discuss the results of the PET scan and PSA with him once available.     Attestation Statement:   I personally performed the service. (TP)  TORIBIO JINNY BARE, MD   The plan was discussed in detail with the patient today, who expressed understanding.  The patient has my contact information, and understands to call me with any additional questions or concerns.  Future Appointments   This patient does not currently have any appointments scheduled.

## 2024-01-02 ENCOUNTER — Other Ambulatory Visit (HOSPITAL_COMMUNITY): Payer: Self-pay | Admitting: Urology

## 2024-01-02 DIAGNOSIS — C61 Malignant neoplasm of prostate: Secondary | ICD-10-CM

## 2024-01-02 NOTE — Progress Notes (Signed)
 Remote Loop Recorder Transmission

## 2024-01-02 NOTE — Progress Notes (Signed)
 Patient had a repeat PSA on 12/25/23 with results of 0.60.  Patient met with Dr. Zachary at [facility] on 12/29/23.  Patient will proceed with a PSMA PET and repeat PSA under Dr. Zachary.   RN reached out to patient and encouraged him to let me know if I can be of assistance at all.    Patient is established with a treatment plan and is actively engaged in care. Nurse Navigator services not currently indicated at this time. Will re-evaluate if needs change or if additional support is requested.

## 2024-01-05 ENCOUNTER — Other Ambulatory Visit: Payer: Self-pay

## 2024-01-05 DIAGNOSIS — R9721 Rising PSA following treatment for malignant neoplasm of prostate: Secondary | ICD-10-CM

## 2024-01-10 ENCOUNTER — Encounter (HOSPITAL_COMMUNITY)
Admission: RE | Admit: 2024-01-10 | Discharge: 2024-01-10 | Disposition: A | Discharge status: Home / Self Care | Source: Ambulatory Visit | Attending: Urology | Admitting: Urology

## 2024-01-10 ENCOUNTER — Inpatient Hospital Stay

## 2024-01-10 DIAGNOSIS — R9721 Rising PSA following treatment for malignant neoplasm of prostate: Secondary | ICD-10-CM

## 2024-01-10 DIAGNOSIS — C61 Malignant neoplasm of prostate: Secondary | ICD-10-CM | POA: Insufficient documentation

## 2024-01-10 MED ORDER — FLOTUFOLASTAT F 18 GALLIUM 296-5846 MBQ/ML IV SOLN
7.2300 | Freq: Once | INTRAVENOUS | Status: AC
  Administered 2024-01-10: 7.23 via INTRAVENOUS

## 2024-01-11 LAB — PSA, TOTAL AND FREE
PSA, Free Pct: 7.8 %
PSA, Free: 0.07 ng/mL
Prostate Specific Ag, Serum: 0.9 ng/mL (ref 0.0–4.0)

## 2024-01-15 ENCOUNTER — Ambulatory Visit: Payer: Medicare Other

## 2024-01-15 ENCOUNTER — Encounter: Payer: Self-pay | Admitting: Cardiovascular Disease

## 2024-01-15 NOTE — Progress Notes (Signed)
 PSA results sent to Dr. Zachary and Dr. Renda.  Request for PSMA PET imaging to be power shared to [facility].

## 2024-01-21 NOTE — Progress Notes (Unsigned)
 Cardiology Office Note:  .   Date:  01/22/2024  ID:  Lonnie Palmer, DOB [birth year], MRN [id number] PCP: Sun, Vyvyan, MD  [facility] Providers Cardiologist:  Darryle ONEIDA Decent, MD Electrophysiologist:  OLE ONEIDA HOLTS, MD   History of Present Illness: .    Chief Complaint  Patient presents with   Follow-up    Lonnie Palmer is a 68 y.o. male with history of CAD, HTN, HLD, pAF who presents for follow-up.    History of Present Illness   Lonnie Palmer is a 68 year old male with paroxysmal atrial fibrillation and flutter who presents for follow-up.  He has a history of paroxysmal atrial fibrillation and flutter, for which he underwent an ablation in 2021 and had a Watchman device placed. He is currently on metoprolol , taking 50 mg in the morning and 25 mg at night, and lisinopril  20 mg with hydrochlorothiazide  12.5 mg daily. He also takes aspirin  81 mg daily. No chest pain or trouble breathing.  He has an elevated coronary calcium score of 385, placing him in the 82nd percentile. He is concerned about the implications of this score on his cardiovascular health, particularly in the context of his prostate cancer treatment options. His cholesterol levels are well controlled, with the most recent LDL cholesterol at 35 mg/dL.  He is dealing with recurrent prostate cancer, with rising PSA levels despite clean PSMA PET scans. He is exploring treatment options, including radiation therapy and ADT treatments, and is consulting with multiple specialists to form a comprehensive care team. He is particularly concerned about the potential cardiovascular side effects of these treatments, given his history of atrial fibrillation and elevated coronary calcium score.  He reports erectile dysfunction and is concerned about the impact of his medications on this condition. He is considering adjusting his medication regimen to address this issue.  He monitors his blood pressure at home,  with readings averaging around 117/75 mmHg, though he notes occasional higher readings. He works from home three days a week and finds this arrangement beneficial for maintaining structure in his life. He is considering reducing his office days to one per week.          Problem List 1. HTN 2.CAD -CAC score 385 (82nd percentile)  -Total chol 111, HDL 50, LDL 35, TG 156 3. PACs -23.3% PAC burden 4.  Atrial flutter/fibrillation  -11/15/2019 -CHADSVASC=2 (HTN, CAD) -s/p AFL/AF ablation 01/03/2020 -27 mm Watchman FLX 05/27/2021    ROS: All other ROS reviewed and negative. Pertinent positives noted in the HPI.     Studies Reviewed: SABRA   EKG Interpretation Date/Time:  Monday January 22 2024 09:17:37 EST Ventricular Rate:  67 PR Interval:  116 QRS Duration:  130 QT Interval:  398 QTC Calculation: 420 R Axis:   -64  Text Interpretation: Normal sinus rhythm Right bundle branch block Left anterior fascicular block Bifascicular block Confirmed by Decent Darryle [id number]) on 01/22/2024 9:29:32 AM   Physical Exam:   VS:  BP (!) 152/80   Pulse 73   Ht 6' 1 (1.854 m)   Wt 183 lb 9.6 oz (83.3 kg)   SpO2 95%   BMI 24.22 kg/m    Wt Readings from Last 3 Encounters:  01/22/24 183 lb 9.6 oz (83.3 kg)  04/11/23 184 lb (83.5 kg)  01/31/23 183 lb (83 kg)    GEN: Well nourished, well developed in no acute distress NECK: No JVD; No carotid bruits CARDIAC: RRR, no murmurs, rubs, gallops RESPIRATORY:  Clear to auscultation without rales, wheezing or rhonchi  ABDOMEN: Soft, non-tender, non-distended EXTREMITIES:  No edema; No deformity  ASSESSMENT AND PLAN: .   Assessment and Plan    Paroxysmal atrial fibrillation and atrial flutter, post-ablation and post-Watchman procedure No recurrence of atrial fibrillation or flutter. Ablation effective in maintaining rhythm. Watchman device provides stroke protection. - Continue current management post-ablation and post-Watchman  procedure.  Hypertension Blood pressure well-controlled with metoprolol  and lisinopril . Metoprolol  may contribute to erectile dysfunction. - Wean off metoprolol : take 25 mg once daily for three days, then stop. - Monitor blood pressure daily. - Continue lisinopril  and HCTZ.  Atherosclerotic heart disease with elevated coronary artery calcium score Coronary artery calcium score elevated but well-managed. Cholesterol levels well-controlled. Low risk of myocardial infarction or stroke. - Continue current cholesterol management with Vytorin  (Zetia  10 mg daily and simvastatin  20 mg daily). - Continue aspirin  81 mg daily.  Hyperlipidemia LDL cholesterol well-controlled at 35. Current treatment regimen effective. - Continue Vytorin  (Zetia  10 mg daily and simvastatin  20 mg daily).  Erectile dysfunction Potentially related to metoprolol  use. Lisinopril  not typically associated with erectile dysfunction. - Wean off metoprolol  as per hypertension plan. - Monitor erectile function after discontinuation of metoprolol .              Follow-up: Return in about 6 months (around 07/21/2024).  Signed, Darryle DASEN. Barbaraann, MD, [facility]  [facility]  [address] [place], [place] [address] [phone]  10:02 AM

## 2024-01-22 ENCOUNTER — Ambulatory Visit (HOSPITAL_BASED_OUTPATIENT_CLINIC_OR_DEPARTMENT_OTHER): Admitting: Cardiovascular Disease

## 2024-01-22 ENCOUNTER — Encounter (HOSPITAL_BASED_OUTPATIENT_CLINIC_OR_DEPARTMENT_OTHER): Payer: Self-pay | Admitting: Cardiovascular Disease

## 2024-01-22 VITALS — BP 152/80 | HR 73 | Ht 73.0 in | Wt 183.6 lb

## 2024-01-22 DIAGNOSIS — I491 Atrial premature depolarization: Secondary | ICD-10-CM

## 2024-01-22 DIAGNOSIS — R931 Abnormal findings on diagnostic imaging of heart and coronary circulation: Secondary | ICD-10-CM

## 2024-01-22 DIAGNOSIS — E782 Mixed hyperlipidemia: Secondary | ICD-10-CM

## 2024-01-22 DIAGNOSIS — I150 Renovascular hypertension: Secondary | ICD-10-CM

## 2024-01-22 DIAGNOSIS — I480 Paroxysmal atrial fibrillation: Secondary | ICD-10-CM

## 2024-01-22 NOTE — Patient Instructions (Signed)
 Medication Instructions:  TAKE METOPROLOL  25 MG DAILY FOR 3 DAYS AND THEN STOP   *If you need a refill on your cardiac medications before your next appointment, please call your pharmacy*  Lab Work: NONE  Testing/Procedures: NONE  Follow-Up: At [facility], you and your health needs are our priority.  As part of our continuing mission to provide you with exceptional heart care, our providers are all part of one team.  This team includes your primary Cardiologist (physician) and Advanced Practice Providers or APPs (Physician Assistants and Nurse Practitioners) who all work together to provide you with the care you need, when you need it.  Your next appointment:   6 month(s)  Provider:   Darryle ONEIDA Decent, MD OR APP    We recommend signing up for the patient portal called MyChart.  Sign up information is provided on this After Visit Summary.  MyChart is used to connect with patients for Virtual Visits (Telemedicine).  Patients are able to view lab/test results, encounter notes, upcoming appointments, etc.  Non-urgent messages can be sent to your provider as well.   To learn more about what you can do with MyChart, go to [email or web address].

## 2024-01-25 ENCOUNTER — Ambulatory Visit

## 2024-01-27 ENCOUNTER — Ambulatory Visit

## 2024-01-27 DIAGNOSIS — I480 Paroxysmal atrial fibrillation: Secondary | ICD-10-CM

## 2024-01-29 LAB — CUP PACEART REMOTE DEVICE CHECK
Date Time Interrogation Session: 20251128230126
Implantable Pulse Generator Implant Date: 20210923

## 2024-01-31 NOTE — Progress Notes (Signed)
 Remote Loop Recorder Transmission

## 2024-02-01 ENCOUNTER — Ambulatory Visit: Payer: Self-pay | Admitting: Cardiovascular Disease

## 2024-02-06 LAB — GENECONNECT MOLECULAR SCREEN: Genetic Analysis Overall Interpretation: NEGATIVE

## 2024-02-19 ENCOUNTER — Ambulatory Visit: Payer: Medicare Other

## 2024-02-26 ENCOUNTER — Ambulatory Visit

## 2024-02-27 ENCOUNTER — Ambulatory Visit

## 2024-02-27 DIAGNOSIS — I480 Paroxysmal atrial fibrillation: Secondary | ICD-10-CM | POA: Diagnosis not present

## 2024-02-28 ENCOUNTER — Ambulatory Visit: Payer: Self-pay | Admitting: Cardiovascular Disease

## 2024-02-28 LAB — CUP PACEART REMOTE DEVICE CHECK
Date Time Interrogation Session: 20251229230349
Implantable Pulse Generator Implant Date: 20210923

## 2024-02-28 NOTE — Progress Notes (Signed)
 Remote Loop Recorder Transmission

## 2024-03-01 ENCOUNTER — Other Ambulatory Visit (HOSPITAL_BASED_OUTPATIENT_CLINIC_OR_DEPARTMENT_OTHER): Payer: Self-pay

## 2024-03-01 ENCOUNTER — Encounter (HOSPITAL_BASED_OUTPATIENT_CLINIC_OR_DEPARTMENT_OTHER): Payer: Self-pay | Admitting: Cardiovascular Disease

## 2024-03-01 MED ORDER — METOPROLOL TARTRATE 50 MG PO TABS
ORAL_TABLET | ORAL | 1 refills | Status: DC
  Filled 2024-03-01: qty 135, 90d supply, fill #0

## 2024-03-20 ENCOUNTER — Other Ambulatory Visit: Payer: Self-pay | Admitting: Cardiovascular Disease

## 2024-03-25 ENCOUNTER — Ambulatory Visit: Payer: Medicare Other

## 2024-03-28 ENCOUNTER — Ambulatory Visit

## 2024-03-29 ENCOUNTER — Ambulatory Visit

## 2024-03-29 DIAGNOSIS — I480 Paroxysmal atrial fibrillation: Secondary | ICD-10-CM

## 2024-03-29 LAB — CUP PACEART REMOTE DEVICE CHECK
Date Time Interrogation Session: 20260129230311
Implantable Pulse Generator Implant Date: 20210923

## 2024-04-02 NOTE — Progress Notes (Signed)
 Remote Loop Recorder Transmission

## 2024-04-29 ENCOUNTER — Ambulatory Visit

## 2024-04-29 ENCOUNTER — Ambulatory Visit: Payer: Medicare Other

## 2024-05-30 ENCOUNTER — Ambulatory Visit

## 2024-06-03 ENCOUNTER — Ambulatory Visit: Payer: Medicare Other

## 2024-07-01 ENCOUNTER — Ambulatory Visit

## 2024-07-08 ENCOUNTER — Ambulatory Visit: Payer: Medicare Other

## 2024-08-01 ENCOUNTER — Ambulatory Visit

## 2024-08-12 ENCOUNTER — Ambulatory Visit: Payer: Medicare Other

## 2024-09-02 ENCOUNTER — Ambulatory Visit

## 2024-09-16 ENCOUNTER — Ambulatory Visit: Payer: Medicare Other

## 2024-10-21 ENCOUNTER — Ambulatory Visit: Payer: Medicare Other

## 2024-11-25 ENCOUNTER — Ambulatory Visit: Payer: Medicare Other
